# Patient Record
Sex: Male | Born: 1965 | Race: White | Hispanic: No | Marital: Married | Smoking: Current every day smoker
Health system: Southern US, Community
[De-identification: ages and names within clinical notes are randomized; demographics above are authoritative.]

## PROBLEM LIST (undated history)

## (undated) DIAGNOSIS — I2510 Atherosclerotic heart disease of native coronary artery without angina pectoris: Secondary | ICD-10-CM

## (undated) DIAGNOSIS — E785 Hyperlipidemia, unspecified: Secondary | ICD-10-CM

## (undated) DIAGNOSIS — F410 Panic disorder [episodic paroxysmal anxiety] without agoraphobia: Secondary | ICD-10-CM

## (undated) DIAGNOSIS — Z720 Tobacco use: Secondary | ICD-10-CM

## (undated) DIAGNOSIS — K259 Gastric ulcer, unspecified as acute or chronic, without hemorrhage or perforation: Secondary | ICD-10-CM

## (undated) DIAGNOSIS — F319 Bipolar disorder, unspecified: Secondary | ICD-10-CM

## (undated) HISTORY — PX: CHOLECYSTECTOMY: SHX55

## (undated) HISTORY — DX: Tobacco use: Z72.0

## (undated) HISTORY — DX: Atherosclerotic heart disease of native coronary artery without angina pectoris: I25.10

## (undated) HISTORY — PX: SMALL INTESTINE SURGERY: SHX150

## (undated) HISTORY — DX: Hyperlipidemia, unspecified: E78.5

---

## 2006-04-22 ENCOUNTER — Emergency Department: Payer: Self-pay | Admitting: General Practice

## 2006-07-11 ENCOUNTER — Emergency Department: Payer: Self-pay | Admitting: Emergency Medicine

## 2006-08-12 ENCOUNTER — Emergency Department: Payer: Self-pay | Admitting: Emergency Medicine

## 2006-11-25 ENCOUNTER — Emergency Department: Payer: Self-pay | Admitting: General Practice

## 2016-01-17 ENCOUNTER — Encounter: Payer: Self-pay | Admitting: Emergency Medicine

## 2016-01-17 ENCOUNTER — Emergency Department
Admission: EM | Admit: 2016-01-17 | Discharge: 2016-01-17 | Disposition: A | Discharge status: Home / Self Care | Payer: Medicare Other | Attending: Emergency Medicine | Admitting: Emergency Medicine

## 2016-01-17 DIAGNOSIS — H6122 Impacted cerumen, left ear: Secondary | ICD-10-CM | POA: Diagnosis not present

## 2016-01-17 DIAGNOSIS — F17200 Nicotine dependence, unspecified, uncomplicated: Secondary | ICD-10-CM | POA: Diagnosis not present

## 2016-01-17 DIAGNOSIS — H9192 Unspecified hearing loss, left ear: Secondary | ICD-10-CM | POA: Diagnosis present

## 2016-01-17 HISTORY — DX: Panic disorder (episodic paroxysmal anxiety): F41.0

## 2016-01-17 HISTORY — DX: Bipolar disorder, unspecified: F31.9

## 2016-01-17 NOTE — ED Notes (Signed)
Pt states two weeks of left sided ear hearing loss without drainage, fever, pain. Pt appears in no acute distress. Pt concerned for wax buildup.

## 2016-01-17 NOTE — Discharge Instructions (Signed)
Cerumen Impaction The structures of the external ear canal secrete a waxy substance known as cerumen. Excess cerumen can build up in the ear canal, causing a condition known as cerumen impaction. Cerumen impaction can cause ear pain and disrupt the function of the ear. The rate of cerumen production differs for each individual. In certain individuals, the configuration of the ear canal may decrease his or her ability to naturally remove cerumen. CAUSES Cerumen impaction is caused by excessive cerumen production or buildup. RISK FACTORS  Frequent use of swabs to clean ears.  Having narrow ear canals.  Having eczema.  Being dehydrated. SIGNS AND SYMPTOMS  Diminished hearing.  Ear drainage.  Ear pain.  Ear itch. TREATMENT Treatment may involve:  Over-the-counter or prescription ear drops to soften the cerumen.  Removal of cerumen by a health care provider. This may be done with:  Irrigation with warm water. This is the most common method of removal.  Ear curettes and other instruments.  Surgery. This may be done in severe cases. HOME CARE INSTRUCTIONS  Take medicines only as directed by your health care provider.  Do not insert objects into the ear with the intent of cleaning the ear. PREVENTION  Do not insert objects into the ear, even with the intent of cleaning the ear. Removing cerumen as a part of normal hygiene is not necessary, and the use of swabs in the ear canal is not recommended.  Drink enough water to keep your urine clear or pale yellow.  Control your eczema if you have it. SEEK MEDICAL CARE IF:  You develop ear pain.  You develop bleeding from the ear.  The cerumen does not clear after you use ear drops as directed.   This information is not intended to replace advice given to you by your health care provider. Make sure you discuss any questions you have with your health care provider.   Document Released: 10/01/2004 Document Revised: 09/14/2014  Document Reviewed: 04/10/2015 Elsevier Interactive Patient Education 2016 [employer]  

## 2016-01-17 NOTE — ED Provider Notes (Signed)
[facility] Emergency Department Provider Note  ____________________________________________  Time seen: Approximately 8:48 PM  I have reviewed the triage vital signs and the nursing notes.   HISTORY  Chief Complaint Hearing Loss    HPI Thomas Romero is a 50 y.o. male who presents to the emergency room with a complaint of left-sided hearing loss. Patient states that he does have excessive earwax. He reports decreased hearing/hearing loss in left ear 2 weeks. Patient denies any trauma to the ear including barotrauma. Patient has not tried any medications prior to arrival. He denies any pain at this time.   Past Medical History  Diagnosis Date  . Manic depression (HCC)   . Panic attacks     There are no active problems to display for this patient.   Past Surgical History  Procedure Laterality Date  . Small intestine surgery      No current outpatient prescriptions on file.  Allergies Review of patient's allergies indicates no known allergies.  No family history on file.  Social History Social History  Substance Use Topics  . Smoking status: Current Some Day Smoker  . Smokeless tobacco: Never Used  . Alcohol Use: Yes     Review of Systems  Constitutional: No fever/chills Eyes: No visual changes. No discharge ENT: Positive for left sided hearing loss. Cardiovascular: no chest pain. Respiratory: no cough. No SOB. Musculoskeletal: Negative for musculoskeletal pain. Skin: Negative for rash, abrasions, lacerations, ecchymosis. Neurological: Negative for headaches, focal weakness or numbness. 10-point ROS otherwise negative.  ____________________________________________   PHYSICAL EXAM:  VITAL SIGNS: ED Triage Vitals  Enc Vitals Group     BP 01/17/16 1950 126/79 mmHg     Pulse Rate 01/17/16 1950 90     Resp 01/17/16 1950 14     Temp 01/17/16 1950 98 F (36.7 C)     Temp Source 01/17/16 1950 Oral     SpO2 01/17/16 1950 96 %    Weight 01/17/16 1950 147 lb (66.679 kg)     Height 01/17/16 1950 5\' 10"  (1.778 m)     Head Cir --      Peak Flow --      Pain Score --      Pain Loc --      Pain Edu? --      Excl. in GC? --      Constitutional: Alert and oriented. Well appearing and in no acute distress. Eyes: Conjunctivae are normal. PERRL. EOMI. Head: Atraumatic. ENT:      Ears: EACs with excessive amounts of cerumen bilaterally. Right side TM is still slightly visualized behind cerumen. Left side reveals complete impaction of cerumen.      Nose: No congestion/rhinnorhea.      Mouth/Throat: Mucous membranes are moist.  Neck: No stridor.   Cardiovascular: Normal rate, regular rhythm. Normal S1 and S2.  Good peripheral circulation. Respiratory: Normal respiratory effort without tachypnea or retractions. Lungs CTAB. Good air entry to the bases with no decreased or absent breath sounds. Musculoskeletal: Full range of motion to all extremities. No gross deformities appreciated. Neurologic:  Normal speech and language. No gross focal neurologic deficits are appreciated.  Skin:  Skin is warm, dry and intact. No rash noted. Psychiatric: Mood and affect are normal. Speech and behavior are normal. Patient exhibits appropriate insight and judgement.   ____________________________________________   LABS (all labs ordered are listed, but only abnormal results are displayed)  Labs Reviewed - No data to display ____________________________________________  EKG  ____________________________________________  RADIOLOGY   No results found.  ____________________________________________    PROCEDURES  Procedure(s) performed:       Medications - No data to display   ____________________________________________   INITIAL IMPRESSION / ASSESSMENT AND PLAN / ED COURSE  Pertinent labs & imaging results that were available during my care of the patient were reviewed by me and considered in my medical  decision making (see chart for details).  Patient's diagnosis is consistent with cerumen impaction to the left ear. Patient routinely has excessive amounts of cerumen. It is impacted on the left side causing hearing loss. Patient is instructed to use cerumen cleaner and warm water with hydrogen peroxide for symptom relief. He'll follow up with ENT surgeon should this not resolve symptoms..  Patient is given ED precautions to return to the ED for any worsening or new symptoms.     ____________________________________________  FINAL CLINICAL IMPRESSION(S) / ED DIAGNOSES  Final diagnoses:  Cerumen impaction, left      NEW MEDICATIONS STARTED DURING THIS VISIT:  New Prescriptions   No medications on file        This chart was dictated using voice recognition software/Dragon. Despite best efforts to proofread, errors can occur which can change the meaning. Any change was purely unintentional.    Racheal Patches, PA-C 01/17/16 2053  Jene Every, MD 01/17/16 [phone]

## 2017-07-06 ENCOUNTER — Encounter: Payer: Self-pay | Admitting: *Deleted

## 2017-07-06 ENCOUNTER — Emergency Department: Payer: Medicare Other

## 2017-07-06 ENCOUNTER — Emergency Department
Admission: EM | Admit: 2017-07-06 | Discharge: 2017-07-06 | Disposition: A | Discharge status: Home / Self Care | Payer: Medicare Other | Attending: Emergency Medicine | Admitting: Emergency Medicine

## 2017-07-06 DIAGNOSIS — Y998 Other external cause status: Secondary | ICD-10-CM | POA: Insufficient documentation

## 2017-07-06 DIAGNOSIS — W108XXA Fall (on) (from) other stairs and steps, initial encounter: Secondary | ICD-10-CM | POA: Diagnosis not present

## 2017-07-06 DIAGNOSIS — Y9301 Activity, walking, marching and hiking: Secondary | ICD-10-CM | POA: Insufficient documentation

## 2017-07-06 DIAGNOSIS — Y929 Unspecified place or not applicable: Secondary | ICD-10-CM | POA: Insufficient documentation

## 2017-07-06 DIAGNOSIS — F17200 Nicotine dependence, unspecified, uncomplicated: Secondary | ICD-10-CM | POA: Diagnosis not present

## 2017-07-06 DIAGNOSIS — S5001XA Contusion of right elbow, initial encounter: Secondary | ICD-10-CM | POA: Insufficient documentation

## 2017-07-06 DIAGNOSIS — S59901A Unspecified injury of right elbow, initial encounter: Secondary | ICD-10-CM | POA: Diagnosis present

## 2017-07-06 MED ORDER — PREDNISONE 10 MG PO TABS: 10.0000 mg | ORAL_TABLET | Freq: Every day | ORAL | 0 refills | Status: DC

## 2017-07-06 MED ORDER — HYDROCODONE-ACETAMINOPHEN 5-325 MG PO TABS
1.0000 | ORAL_TABLET | ORAL | 0 refills | Status: DC | PRN
Start: 2017-07-06 — End: 2018-08-02

## 2017-07-06 MED ORDER — OXYCODONE-ACETAMINOPHEN 5-325 MG PO TABS
1.0000 | ORAL_TABLET | Freq: Once | ORAL | Status: AC
  Administered 2017-07-06: 1 via ORAL
  Filled 2017-07-06: qty 1

## 2017-07-06 MED ORDER — ONDANSETRON 8 MG PO TBDP
8.0000 mg | ORAL_TABLET | Freq: Once | ORAL | Status: AC
  Administered 2017-07-06: 8 mg via ORAL
  Filled 2017-07-06: qty 1

## 2017-07-06 NOTE — ED Triage Notes (Signed)
States right elbow pain after he slipped down the stairs this afternoon, awake and alert in no acute distress

## 2017-07-06 NOTE — ED Provider Notes (Signed)
[facility] Emergency Department Provider Note  ____________________________________________  Time seen: Approximately 4:49 PM  I have reviewed the triage vital signs and the nursing notes.   HISTORY  Chief Complaint Elbow Injury    HPI Thomas Romero is a 51 y.o. male who presents the emergency department complaining of right elbow pain.  Patient reports that he was walking down a flight of stairs when he slipped, falling, landing directly on his right elbow.  Patient has a very remote history of nondisplaced elbow fracture occurring almost 30 years prior.  Patient reports that he has pain over the posterior aspect of the elbow.  He has had little range of motion since the injury due to pain.  No numbness or tingling in his hand.  No injury to his shoulder.  No other injury or complaint at this time.  He did not hit his head or lose consciousness, complaining of chest pain, shortness of breath, nausea or vomiting.  No medications for this complaint prior to arrival.  Past Medical History:  Diagnosis Date  . Manic depression (HCC)   . Panic attacks     There are no active problems to display for this patient.   Past Surgical History:  Procedure Laterality Date  . SMALL INTESTINE SURGERY      Prior to Admission medications   Medication Sig Start Date End Date Taking? Authorizing Provider  HYDROcodone-acetaminophen (NORCO/VICODIN) 5-325 MG tablet Take 1 tablet by mouth every 4 (four) hours as needed for moderate pain. 07/06/17   Breindy Meadow, Delorise Royals, PA-C  predniSONE (DELTASONE) 10 MG tablet Take 1 tablet (10 mg total) by mouth daily. 07/06/17   Samaj Wessells, Delorise Royals, PA-C    Allergies Patient has no known allergies.  History reviewed. No pertinent family history.  Social History Social History  Substance Use Topics  . Smoking status: Current Some Day Smoker  . Smokeless tobacco: Never Used  . Alcohol use Yes     Review of Systems   Constitutional: No fever/chills Eyes: No visual changes.  Cardiovascular: no chest pain. Respiratory: no cough. No SOB. Musculoskeletal: Positive for right elbow pain Skin: Negative for rash, abrasions, lacerations, ecchymosis. Neurological: Negative for headaches, focal weakness or numbness. 10-point ROS otherwise negative.  ____________________________________________   PHYSICAL EXAM:  VITAL SIGNS: ED Triage Vitals [07/06/17 1601]  Enc Vitals Group     BP (!) 145/87     Pulse Rate (!) 110     Resp 18     Temp 97.8 F (36.6 C)     Temp Source Oral     SpO2 96 %     Weight 146 lb (66.2 kg)     Height 5\' 10"  (1.778 m)     Head Circumference      Peak Flow      Pain Score 8     Pain Loc      Pain Edu?      Excl. in GC?      Constitutional: Alert and oriented. Well appearing and in no acute distress. Eyes: Conjunctivae are normal. PERRL. EOMI. Head: Atraumatic. Neck: No stridor.    Cardiovascular: Normal rate, regular rhythm. Normal S1 and S2.  Good peripheral circulation. Respiratory: Normal respiratory effort without tachypnea or retractions. Lungs CTAB. Good air entry to the bases with no decreased or absent breath sounds. Musculoskeletal: Full range of motion to all extremities. No gross deformities appreciated.  No gross deformity, edema, ecchymosis noted to the right elbow.  Patient is tender  to palpation over the posterior elbow with significant concentration around the olecranon process.  No palpable abnormality.  Limited range of motion due to pain, however patient does have extension and flexion of the elbow.  At rest, patient is keeping his elbow and flexion.  Radial pulse intact distally.  Sensation intact all 5 digits distally. Neurologic:  Normal speech and language. No gross focal neurologic deficits are appreciated.  Skin:  Skin is warm, dry and intact. No rash noted. Psychiatric: Mood and affect are normal. Speech and behavior are normal. Patient exhibits  appropriate insight and judgement.   ____________________________________________   LABS (all labs ordered are listed, but only abnormal results are displayed)  Labs Reviewed - No data to display ____________________________________________  EKG   ____________________________________________  RADIOLOGY Festus BarrenI, Rayne Cowdrey D Benett Swoyer, personally viewed and evaluated these images (plain radiographs) as part of my medical decision making, as well as reviewing the written report by the radiologist.  Dg Elbow Complete Right  Result Date: 07/06/2017 CLINICAL DATA:  Posterior elbow pain after fall this afternoon. EXAM: RIGHT ELBOW - COMPLETE 3+ VIEW COMPARISON:  None. FINDINGS: There is no evidence of fracture, dislocation, or joint effusion. Minimal spurring at the triceps insertion on the olecranon. There is no evidence of arthropathy or other focal bone abnormality. Soft tissues are unremarkable. IMPRESSION: Tiny olecranon spur. No acute fracture nor dislocation. No joint effusion. Electronically Signed   By: Tollie Ethavid  Kwon M.D.   On: 07/06/2017 17:55    ____________________________________________    PROCEDURES  Procedure(s) performed:    Procedures    Medications  oxyCODONE-acetaminophen (PERCOCET/ROXICET) 5-325 MG per tablet 1 tablet (1 tablet Oral Given 07/06/17 1711)  ondansetron (ZOFRAN-ODT) disintegrating tablet 8 mg (8 mg Oral Given 07/06/17 1711)     ____________________________________________   INITIAL IMPRESSION / ASSESSMENT AND PLAN / ED COURSE  Pertinent labs & imaging results that were available during my care of the patient were reviewed by me and considered in my medical decision making (see chart for details).  Review of the [place] CSRS was performed in accordance of the [facility] prior to dispensing any controlled drugs.     Patient's diagnosis is consistent with elbow contusion.  Initial differential included fracture versus contusion versus joint effusion.   Patient's exam is reassuring.  X-ray reveals no patient is given sling for comfort.. Patient will be discharged home with prescriptions for prednisone taper as patient is unable to take NSAIDs due to gastric ulcers, and very limited pain medication.. Patient is to follow up with primary care or orthopedics as needed or otherwise directed. Patient is given ED precautions to return to the ED for any worsening or new symptoms.     ____________________________________________  FINAL CLINICAL IMPRESSION(S) / ED DIAGNOSES  Final diagnoses:  Contusion of right elbow, initial encounter      NEW MEDICATIONS STARTED DURING THIS VISIT:  New Prescriptions   HYDROCODONE-ACETAMINOPHEN (NORCO/VICODIN) 5-325 MG TABLET    Take 1 tablet by mouth every 4 (four) hours as needed for moderate pain.   PREDNISONE (DELTASONE) 10 MG TABLET    Take 1 tablet (10 mg total) by mouth daily.        This chart was dictated using voice recognition software/Dragon. Despite best efforts to proofread, errors can occur which can change the meaning. Any change was purely unintentional.    Racheal PatchesCuthriell, Emett Stapel D, PA-C 07/06/17 1808    Rockne MenghiniNorman, Anne-Caroline, MD 07/06/17 [phone]

## 2018-07-24 ENCOUNTER — Emergency Department: Payer: Medicare HMO

## 2018-07-24 ENCOUNTER — Inpatient Hospital Stay
Admission: EM | Admit: 2018-07-24 | Discharge: 2018-07-25 | DRG: 247 | Discharge status: Against Medical Advice | Payer: Medicare HMO | Attending: Cardiovascular Disease | Admitting: Cardiovascular Disease

## 2018-07-24 DIAGNOSIS — Z7902 Long term (current) use of antithrombotics/antiplatelets: Secondary | ICD-10-CM

## 2018-07-24 DIAGNOSIS — I2111 ST elevation (STEMI) myocardial infarction involving right coronary artery: Secondary | ICD-10-CM

## 2018-07-24 DIAGNOSIS — I214 Non-ST elevation (NSTEMI) myocardial infarction: Secondary | ICD-10-CM | POA: Diagnosis present

## 2018-07-24 DIAGNOSIS — Z5329 Procedure and treatment not carried out because of patient's decision for other reasons: Secondary | ICD-10-CM | POA: Diagnosis not present

## 2018-07-24 DIAGNOSIS — Z951 Presence of aortocoronary bypass graft: Secondary | ICD-10-CM

## 2018-07-24 DIAGNOSIS — I10 Essential (primary) hypertension: Secondary | ICD-10-CM | POA: Diagnosis present

## 2018-07-24 DIAGNOSIS — F17200 Nicotine dependence, unspecified, uncomplicated: Secondary | ICD-10-CM | POA: Diagnosis present

## 2018-07-24 DIAGNOSIS — R7989 Other specified abnormal findings of blood chemistry: Secondary | ICD-10-CM | POA: Diagnosis not present

## 2018-07-24 DIAGNOSIS — F32A Depression, unspecified: Secondary | ICD-10-CM | POA: Diagnosis present

## 2018-07-24 DIAGNOSIS — F329 Major depressive disorder, single episode, unspecified: Secondary | ICD-10-CM | POA: Diagnosis present

## 2018-07-24 DIAGNOSIS — Z79899 Other long term (current) drug therapy: Secondary | ICD-10-CM

## 2018-07-24 DIAGNOSIS — F3130 Bipolar disorder, current episode depressed, mild or moderate severity, unspecified: Secondary | ICD-10-CM | POA: Diagnosis present

## 2018-07-24 DIAGNOSIS — R778 Other specified abnormalities of plasma proteins: Secondary | ICD-10-CM

## 2018-07-24 DIAGNOSIS — E119 Type 2 diabetes mellitus without complications: Secondary | ICD-10-CM | POA: Diagnosis present

## 2018-07-24 DIAGNOSIS — Z8249 Family history of ischemic heart disease and other diseases of the circulatory system: Secondary | ICD-10-CM

## 2018-07-24 DIAGNOSIS — Z8711 Personal history of peptic ulcer disease: Secondary | ICD-10-CM

## 2018-07-24 DIAGNOSIS — F410 Panic disorder [episodic paroxysmal anxiety] without agoraphobia: Secondary | ICD-10-CM | POA: Diagnosis present

## 2018-07-24 DIAGNOSIS — Z886 Allergy status to analgesic agent status: Secondary | ICD-10-CM

## 2018-07-24 DIAGNOSIS — Z7982 Long term (current) use of aspirin: Secondary | ICD-10-CM

## 2018-07-24 DIAGNOSIS — R079 Chest pain, unspecified: Secondary | ICD-10-CM

## 2018-07-24 DIAGNOSIS — I2510 Atherosclerotic heart disease of native coronary artery without angina pectoris: Secondary | ICD-10-CM | POA: Diagnosis present

## 2018-07-24 DIAGNOSIS — E785 Hyperlipidemia, unspecified: Secondary | ICD-10-CM | POA: Diagnosis present

## 2018-07-24 DIAGNOSIS — I213 ST elevation (STEMI) myocardial infarction of unspecified site: Principal | ICD-10-CM | POA: Diagnosis present

## 2018-07-24 DIAGNOSIS — I252 Old myocardial infarction: Secondary | ICD-10-CM

## 2018-07-24 DIAGNOSIS — F419 Anxiety disorder, unspecified: Secondary | ICD-10-CM | POA: Diagnosis present

## 2018-07-24 DIAGNOSIS — E876 Hypokalemia: Secondary | ICD-10-CM | POA: Diagnosis present

## 2018-07-24 DIAGNOSIS — K219 Gastro-esophageal reflux disease without esophagitis: Secondary | ICD-10-CM | POA: Diagnosis present

## 2018-07-24 HISTORY — DX: Gastric ulcer, unspecified as acute or chronic, without hemorrhage or perforation: K25.9

## 2018-07-24 LAB — CBC
HCT: 45 % (ref 39.0–52.0)
Hemoglobin: 16 g/dL (ref 13.0–17.0)
MCH: 32.1 pg (ref 26.0–34.0)
MCHC: 35.6 g/dL (ref 30.0–36.0)
MCV: 90.4 fL (ref 80.0–100.0)
NRBC: 0.2 % (ref 0.0–0.2)
PLATELETS: 192 10*3/uL (ref 150–400)
RBC: 4.98 MIL/uL (ref 4.22–5.81)
RDW: 12.4 % (ref 11.5–15.5)
WBC: 12.1 10*3/uL — AB (ref 4.0–10.5)

## 2018-07-24 LAB — BASIC METABOLIC PANEL
Anion gap: 8 (ref 5–15)
BUN: 14 mg/dL (ref 6–20)
CALCIUM: 9.7 mg/dL (ref 8.9–10.3)
CO2: 28 mmol/L (ref 22–32)
CREATININE: 1.15 mg/dL (ref 0.61–1.24)
Chloride: 102 mmol/L (ref 98–111)
Glucose, Bld: 126 mg/dL — ABNORMAL HIGH (ref 70–99)
Potassium: 3.8 mmol/L (ref 3.5–5.1)
SODIUM: 138 mmol/L (ref 135–145)

## 2018-07-24 LAB — PROTIME-INR
INR: 0.89
Prothrombin Time: 12 seconds (ref 11.4–15.2)

## 2018-07-24 LAB — TROPONIN I: TROPONIN I: 1.77 ng/mL — AB (ref <0.03)

## 2018-07-24 LAB — APTT: aPTT: 29 seconds (ref 24–36)

## 2018-07-24 MED ORDER — NITROGLYCERIN 0.4 MG SL SUBL
SUBLINGUAL_TABLET | SUBLINGUAL | Status: AC
  Filled 2018-07-24: qty 1

## 2018-07-24 MED ORDER — HEPARIN (PORCINE) 25000 UT/250ML-% IV SOLN
800.0000 [IU]/h | INTRAVENOUS | Status: DC
  Administered 2018-07-24: 800 [IU]/h via INTRAVENOUS
  Filled 2018-07-24: qty 250

## 2018-07-24 MED ORDER — NITROGLYCERIN IN D5W 200-5 MCG/ML-% IV SOLN
0.0000 ug/min | INTRAVENOUS | Status: DC
  Administered 2018-07-24: 5 ug/min via INTRAVENOUS
  Filled 2018-07-24: qty 250

## 2018-07-24 MED ORDER — NITROGLYCERIN 0.4 MG SL SUBL
0.4000 mg | SUBLINGUAL_TABLET | SUBLINGUAL | Status: DC | PRN
  Administered 2018-07-24 (×2): 0.4 mg via SUBLINGUAL

## 2018-07-24 MED ORDER — LORAZEPAM 2 MG/ML IJ SOLN
0.5000 mg | Freq: Once | INTRAMUSCULAR | Status: AC
Start: 2018-07-24 — End: 2018-07-24
  Administered 2018-07-24: 0.5 mg via INTRAVENOUS
  Filled 2018-07-24: qty 1

## 2018-07-24 MED ORDER — ASPIRIN 81 MG PO CHEW
CHEWABLE_TABLET | ORAL | Status: AC
  Administered 2018-07-25: 04:00:00
  Filled 2018-07-24: qty 4

## 2018-07-24 MED ORDER — HEPARIN BOLUS VIA INFUSION
4000.0000 [IU] | Freq: Once | INTRAVENOUS | Status: AC
  Administered 2018-07-24: 4000 [IU] via INTRAVENOUS
  Filled 2018-07-24: qty 4000

## 2018-07-24 MED ORDER — SODIUM CHLORIDE 0.9 % IV SOLN
Freq: Once | INTRAVENOUS | Status: AC
  Administered 2018-07-24: via INTRAVENOUS

## 2018-07-24 MED ORDER — NICOTINE 21 MG/24HR TD PT24
21.0000 mg | MEDICATED_PATCH | Freq: Once | TRANSDERMAL | Status: DC
  Administered 2018-07-24: 21 mg via TRANSDERMAL
  Filled 2018-07-24: qty 1

## 2018-07-24 MED ORDER — ASPIRIN 81 MG PO CHEW
324.0000 mg | CHEWABLE_TABLET | Freq: Once | ORAL | Status: AC
  Administered 2018-07-24: 324 mg via ORAL

## 2018-07-24 NOTE — ED Notes (Signed)
Pt states he wants to leave and go smoke. Pt states he will stay under " one stipulation is that I go outside and smoke first" MD Derrill KayGoodman at bedside and explaining protocol and unsafety of pt being able to go outside. PT upset at this time. [facility]

## 2018-07-24 NOTE — ED Triage Notes (Addendum)
Patient c/o left chest pain, radiating to back X 2 hours. Patient describes pain as "being hit in the chest with a sledgehammer". Patient clutching chest in triage.  Patient reports accompanying symptoms of SOB, fatigue.   Patient denies diaphoresis, per significant other, the patient was pale, near syncopal, and diaphoretic.

## 2018-07-24 NOTE — Consult Note (Signed)
ANTICOAGULATION CONSULT NOTE - Follow Up Consult  Pharmacy Consult for Heparin Infusion  Indication: chest pain/ACS  Allergies  Allergen Reactions  . Ibuprofen Other (See Comments)    Gastric ulcers    Patient Measurements: Height: 5\' 10"  (177.8 cm) Weight: 145 lb 15.1 oz (66.2 kg) IBW/kg (Calculated) : 73  Vital Signs: Temp: 97.5 F (36.4 C) (11/17 2227) Temp Source: Axillary (11/17 2227) BP: 127/78 (11/17 2253) Pulse Rate: 82 (11/17 2300)  Labs: Recent Labs    07/24/18 2236  HGB 16.0  HCT 45.0  PLT 192  CREATININE 1.15  TROPONINI 1.77*    Estimated Creatinine Clearance: 71.2 mL/min (by C-G formula based on SCr of 1.15 mg/dL).   Assessment: Pharmacy consulted for heparin drip dosing and monitoring in 52 yo male for ACS/STEMI  Goal of Therapy:  Heparin level 0.3-0.7 units/ml Monitor platelets by anticoagulation protocol: Yes   Plan:  Baseline labs ordered Give 4000 units bolus x 1 Start heparin infusion at 800 units/hr Check anti-Xa level in 6 hours and daily while on heparin Continue to monitor H&H and platelets  Gardner CandleSheema M Maylen Waltermire, PharmD, BCPS Clinical Pharmacist 07/24/2018 11:09 PM

## 2018-07-24 NOTE — ED Notes (Signed)
Pt placed on cardiac pads.

## 2018-07-25 ENCOUNTER — Other Ambulatory Visit: Payer: Self-pay

## 2018-07-25 ENCOUNTER — Encounter: Payer: Self-pay | Admitting: Cardiovascular Disease

## 2018-07-25 ENCOUNTER — Encounter
Admission: EM | Discharge status: Against Medical Advice | Payer: Self-pay | Source: Home / Self Care | Attending: Cardiovascular Disease

## 2018-07-25 DIAGNOSIS — Z8249 Family history of ischemic heart disease and other diseases of the circulatory system: Secondary | ICD-10-CM | POA: Diagnosis not present

## 2018-07-25 DIAGNOSIS — E119 Type 2 diabetes mellitus without complications: Secondary | ICD-10-CM | POA: Diagnosis present

## 2018-07-25 DIAGNOSIS — F329 Major depressive disorder, single episode, unspecified: Secondary | ICD-10-CM | POA: Diagnosis present

## 2018-07-25 DIAGNOSIS — Z951 Presence of aortocoronary bypass graft: Secondary | ICD-10-CM | POA: Diagnosis not present

## 2018-07-25 DIAGNOSIS — I10 Essential (primary) hypertension: Secondary | ICD-10-CM | POA: Diagnosis present

## 2018-07-25 DIAGNOSIS — Z886 Allergy status to analgesic agent status: Secondary | ICD-10-CM | POA: Diagnosis not present

## 2018-07-25 DIAGNOSIS — F17200 Nicotine dependence, unspecified, uncomplicated: Secondary | ICD-10-CM | POA: Diagnosis present

## 2018-07-25 DIAGNOSIS — I2510 Atherosclerotic heart disease of native coronary artery without angina pectoris: Secondary | ICD-10-CM

## 2018-07-25 DIAGNOSIS — I252 Old myocardial infarction: Secondary | ICD-10-CM | POA: Diagnosis not present

## 2018-07-25 DIAGNOSIS — R7989 Other specified abnormal findings of blood chemistry: Secondary | ICD-10-CM | POA: Diagnosis present

## 2018-07-25 DIAGNOSIS — I214 Non-ST elevation (NSTEMI) myocardial infarction: Secondary | ICD-10-CM | POA: Diagnosis not present

## 2018-07-25 DIAGNOSIS — E785 Hyperlipidemia, unspecified: Secondary | ICD-10-CM | POA: Diagnosis present

## 2018-07-25 DIAGNOSIS — Z79899 Other long term (current) drug therapy: Secondary | ICD-10-CM | POA: Diagnosis not present

## 2018-07-25 DIAGNOSIS — I213 ST elevation (STEMI) myocardial infarction of unspecified site: Secondary | ICD-10-CM | POA: Diagnosis present

## 2018-07-25 DIAGNOSIS — Z8711 Personal history of peptic ulcer disease: Secondary | ICD-10-CM | POA: Diagnosis not present

## 2018-07-25 DIAGNOSIS — Z7902 Long term (current) use of antithrombotics/antiplatelets: Secondary | ICD-10-CM | POA: Diagnosis not present

## 2018-07-25 DIAGNOSIS — F410 Panic disorder [episodic paroxysmal anxiety] without agoraphobia: Secondary | ICD-10-CM | POA: Diagnosis present

## 2018-07-25 DIAGNOSIS — K219 Gastro-esophageal reflux disease without esophagitis: Secondary | ICD-10-CM | POA: Diagnosis present

## 2018-07-25 DIAGNOSIS — F32A Depression, unspecified: Secondary | ICD-10-CM | POA: Diagnosis present

## 2018-07-25 DIAGNOSIS — E876 Hypokalemia: Secondary | ICD-10-CM | POA: Diagnosis present

## 2018-07-25 DIAGNOSIS — Z7982 Long term (current) use of aspirin: Secondary | ICD-10-CM | POA: Diagnosis not present

## 2018-07-25 DIAGNOSIS — F419 Anxiety disorder, unspecified: Secondary | ICD-10-CM | POA: Diagnosis present

## 2018-07-25 DIAGNOSIS — F3130 Bipolar disorder, current episode depressed, mild or moderate severity, unspecified: Secondary | ICD-10-CM | POA: Diagnosis present

## 2018-07-25 DIAGNOSIS — Z5329 Procedure and treatment not carried out because of patient's decision for other reasons: Secondary | ICD-10-CM | POA: Diagnosis not present

## 2018-07-25 HISTORY — PX: CORONARY/GRAFT ACUTE MI REVASCULARIZATION: CATH118305

## 2018-07-25 HISTORY — PX: LEFT HEART CATH AND CORONARY ANGIOGRAPHY: CATH118249

## 2018-07-25 LAB — CBC
HEMATOCRIT: 40.5 % (ref 39.0–52.0)
HEMOGLOBIN: 13.9 g/dL (ref 13.0–17.0)
MCH: 31.7 pg (ref 26.0–34.0)
MCHC: 34.3 g/dL (ref 30.0–36.0)
MCV: 92.3 fL (ref 80.0–100.0)
Platelets: 171 10*3/uL (ref 150–400)
RBC: 4.39 MIL/uL (ref 4.22–5.81)
RDW: 12.4 % (ref 11.5–15.5)
WBC: 10.9 10*3/uL — AB (ref 4.0–10.5)
nRBC: 0 % (ref 0.0–0.2)

## 2018-07-25 LAB — BASIC METABOLIC PANEL
Anion gap: 6 (ref 5–15)
BUN: 15 mg/dL (ref 6–20)
CHLORIDE: 106 mmol/L (ref 98–111)
CO2: 26 mmol/L (ref 22–32)
Calcium: 8.8 mg/dL — ABNORMAL LOW (ref 8.9–10.3)
Creatinine, Ser: 0.94 mg/dL (ref 0.61–1.24)
GFR calc Af Amer: >60 mL/min (ref >60)
GFR calc non Af Amer: >60 mL/min (ref >60)
GLUCOSE: 139 mg/dL — AB (ref 70–99)
Potassium: 3.3 mmol/L — ABNORMAL LOW (ref 3.5–5.1)
SODIUM: 138 mmol/L (ref 135–145)

## 2018-07-25 LAB — URINE DRUG SCREEN, QUALITATIVE (ARMC ONLY)
Amphetamines, Ur Screen: NOT DETECTED
BARBITURATES, UR SCREEN: NOT DETECTED
BENZODIAZEPINE, UR SCRN: NOT DETECTED
CANNABINOID 50 NG, UR ~~LOC~~: NOT DETECTED
Cocaine Metabolite,Ur ~~LOC~~: NOT DETECTED
MDMA (Ecstasy)Ur Screen: NOT DETECTED
Methadone Scn, Ur: NOT DETECTED
Opiate, Ur Screen: NOT DETECTED
PHENCYCLIDINE (PCP) UR S: NOT DETECTED
TRICYCLIC, UR SCREEN: NOT DETECTED

## 2018-07-25 LAB — HEPATIC FUNCTION PANEL
ALK PHOS: 74 U/L (ref 38–126)
ALT: 22 U/L (ref 0–44)
AST: 72 U/L — ABNORMAL HIGH (ref 15–41)
Albumin: 3.8 g/dL (ref 3.5–5.0)
BILIRUBIN DIRECT: 0.1 mg/dL (ref 0.0–0.2)
BILIRUBIN INDIRECT: 0.7 mg/dL (ref 0.3–0.9)
BILIRUBIN TOTAL: 0.8 mg/dL (ref 0.3–1.2)
TOTAL PROTEIN: 6.4 g/dL — AB (ref 6.5–8.1)

## 2018-07-25 LAB — TROPONIN I: Troponin I: 18.51 ng/mL (ref <0.03)

## 2018-07-25 LAB — PHOSPHORUS: Phosphorus: 2.5 mg/dL (ref 2.5–4.6)

## 2018-07-25 LAB — LIPID PANEL
Cholesterol: 203 mg/dL — ABNORMAL HIGH (ref 0–200)
HDL: 24 mg/dL — AB (ref >40)
LDL CALC: 152 mg/dL — AB (ref 0–99)
TRIGLYCERIDES: 134 mg/dL (ref <150)
Total CHOL/HDL Ratio: 8.5 RATIO
VLDL: 27 mg/dL (ref 0–40)

## 2018-07-25 LAB — GLUCOSE, CAPILLARY: Glucose-Capillary: 106 mg/dL — ABNORMAL HIGH (ref 70–99)

## 2018-07-25 LAB — MRSA PCR SCREENING: MRSA BY PCR: NEGATIVE

## 2018-07-25 LAB — MAGNESIUM: Magnesium: 2.1 mg/dL (ref 1.7–2.4)

## 2018-07-25 LAB — POCT ACTIVATED CLOTTING TIME: ACTIVATED CLOTTING TIME: 230 s

## 2018-07-25 SURGERY — CORONARY/GRAFT ACUTE MI REVASCULARIZATION
Anesthesia: Moderate Sedation

## 2018-07-25 MED ORDER — HEPARIN SODIUM (PORCINE) 1000 UNIT/ML IJ SOLN
INTRAMUSCULAR | Status: AC
  Filled 2018-07-25: qty 1

## 2018-07-25 MED ORDER — SODIUM CHLORIDE 0.9 % IV SOLN
INTRAVENOUS | Status: AC | PRN
  Administered 2018-07-25: 250 mL via INTRAVENOUS

## 2018-07-25 MED ORDER — HEPARIN SODIUM (PORCINE) 1000 UNIT/ML IJ SOLN
INTRAMUSCULAR | Status: DC | PRN
  Administered 2018-07-25: 2000 [IU] via INTRAVENOUS
  Administered 2018-07-25: 3000 [IU] via INTRAVENOUS

## 2018-07-25 MED ORDER — SODIUM CHLORIDE 0.9 % WEIGHT BASED INFUSION: 1.0000 mL/kg/h | INTRAVENOUS | Status: DC

## 2018-07-25 MED ORDER — SODIUM CHLORIDE 0.9% FLUSH: 3.0000 mL | Freq: Two times a day (BID) | INTRAVENOUS | Status: DC

## 2018-07-25 MED ORDER — ACETAMINOPHEN 325 MG PO TABS: 650.0000 mg | ORAL_TABLET | ORAL | Status: DC | PRN

## 2018-07-25 MED ORDER — VERAPAMIL HCL 2.5 MG/ML IV SOLN
INTRAVENOUS | Status: DC | PRN
  Administered 2018-07-25: 2.5 mg via INTRA_ARTERIAL

## 2018-07-25 MED ORDER — MIDAZOLAM HCL 2 MG/2ML IJ SOLN
INTRAMUSCULAR | Status: AC
  Filled 2018-07-25: qty 2

## 2018-07-25 MED ORDER — FENTANYL CITRATE (PF) 100 MCG/2ML IJ SOLN
INTRAMUSCULAR | Status: AC
  Filled 2018-07-25: qty 2

## 2018-07-25 MED ORDER — HEPARIN (PORCINE) IN NACL 1000-0.9 UT/500ML-% IV SOLN
INTRAVENOUS | Status: AC
  Filled 2018-07-25: qty 1000

## 2018-07-25 MED ORDER — PANTOPRAZOLE SODIUM 40 MG PO TBEC: 40.0000 mg | DELAYED_RELEASE_TABLET | Freq: Every day | ORAL | 6 refills | Status: DC

## 2018-07-25 MED ORDER — PANTOPRAZOLE SODIUM 40 MG PO TBEC
40.0000 mg | DELAYED_RELEASE_TABLET | Freq: Every day | ORAL | Status: DC
  Administered 2018-07-25: 40 mg via ORAL
  Filled 2018-07-25: qty 1

## 2018-07-25 MED ORDER — ATORVASTATIN CALCIUM 20 MG PO TABS: 80.0000 mg | ORAL_TABLET | Freq: Every day | ORAL | Status: DC

## 2018-07-25 MED ORDER — IOPAMIDOL (ISOVUE-300) INJECTION 61%
INTRAVENOUS | Status: DC | PRN
  Administered 2018-07-25: 125 mL via INTRA_ARTERIAL

## 2018-07-25 MED ORDER — ATORVASTATIN CALCIUM 80 MG PO TABS: 80.0000 mg | ORAL_TABLET | Freq: Every day | ORAL | 6 refills | Status: DC

## 2018-07-25 MED ORDER — ALPRAZOLAM 0.5 MG PO TABS
0.5000 mg | ORAL_TABLET | Freq: Three times a day (TID) | ORAL | Status: DC | PRN
  Administered 2018-07-25: 0.5 mg via ORAL
  Filled 2018-07-25: qty 1

## 2018-07-25 MED ORDER — SODIUM CHLORIDE 0.9 % IV SOLN: 250.0000 mL | INTRAVENOUS | Status: DC | PRN

## 2018-07-25 MED ORDER — NITROGLYCERIN 5 MG/ML IV SOLN
INTRAVENOUS | Status: AC
  Filled 2018-07-25: qty 10

## 2018-07-25 MED ORDER — TICAGRELOR 90 MG PO TABS
ORAL_TABLET | ORAL | Status: DC | PRN
  Administered 2018-07-25: 180 mg via ORAL

## 2018-07-25 MED ORDER — TICAGRELOR 90 MG PO TABS: 90.0000 mg | ORAL_TABLET | Freq: Two times a day (BID) | ORAL | 6 refills | Status: DC

## 2018-07-25 MED ORDER — ENOXAPARIN SODIUM 40 MG/0.4ML ~~LOC~~ SOLN: 40.0000 mg | SUBCUTANEOUS | Status: DC

## 2018-07-25 MED ORDER — VERAPAMIL HCL 2.5 MG/ML IV SOLN
INTRAVENOUS | Status: AC
  Filled 2018-07-25: qty 2

## 2018-07-25 MED ORDER — NITROGLYCERIN 1 MG/10 ML FOR IR/CATH LAB
INTRA_ARTERIAL | Status: DC | PRN
  Administered 2018-07-25: 200 ug via INTRACORONARY
  Administered 2018-07-25: 100 ug via INTRACORONARY

## 2018-07-25 MED ORDER — ONDANSETRON HCL 4 MG/2ML IJ SOLN: 4.0000 mg | Freq: Four times a day (QID) | INTRAMUSCULAR | Status: DC | PRN

## 2018-07-25 MED ORDER — POTASSIUM CHLORIDE 20 MEQ PO PACK
60.0000 meq | PACK | Freq: Once | ORAL | Status: AC
  Administered 2018-07-25: 60 meq via ORAL
  Filled 2018-07-25: qty 3

## 2018-07-25 MED ORDER — NICOTINE 21 MG/24HR TD PT24
21.0000 mg | MEDICATED_PATCH | Freq: Every day | TRANSDERMAL | Status: DC
  Filled 2018-07-25: qty 1

## 2018-07-25 MED ORDER — ASPIRIN 81 MG PO CHEW: 81.0000 mg | CHEWABLE_TABLET | Freq: Every day | ORAL | 0 refills | Status: AC

## 2018-07-25 MED ORDER — TICAGRELOR 90 MG PO TABS
ORAL_TABLET | ORAL | Status: AC
  Filled 2018-07-25: qty 2

## 2018-07-25 MED ORDER — SODIUM CHLORIDE 0.9% FLUSH: 3.0000 mL | INTRAVENOUS | Status: DC | PRN

## 2018-07-25 MED ORDER — ASPIRIN 81 MG PO CHEW
81.0000 mg | CHEWABLE_TABLET | Freq: Every day | ORAL | Status: DC
  Administered 2018-07-25: 81 mg via ORAL
  Filled 2018-07-25: qty 1

## 2018-07-25 MED ORDER — TICAGRELOR 90 MG PO TABS
90.0000 mg | ORAL_TABLET | Freq: Two times a day (BID) | ORAL | Status: DC
  Administered 2018-07-25: 90 mg via ORAL
  Filled 2018-07-25 (×2): qty 1

## 2018-07-25 MED ORDER — METOPROLOL TARTRATE 25 MG PO TABS
12.5000 mg | ORAL_TABLET | Freq: Two times a day (BID) | ORAL | Status: DC
  Administered 2018-07-25: 12.5 mg via ORAL
  Filled 2018-07-25 (×2): qty 1

## 2018-07-25 SURGICAL SUPPLY — 13 items
BALLN TREK RX 2.5X12 (BALLOONS) ×3
BALLOON TREK RX 2.5X12 (BALLOONS) ×1 IMPLANT
CATH INFINITI 5FR JK (CATHETERS) ×3 IMPLANT
CATH VISTA GUIDE 6FR JR4 (CATHETERS) ×3 IMPLANT
CATH VISTA GUIDE 6FR JR4 SH (CATHETERS) ×3 IMPLANT
DEVICE INFLAT 30 PLUS (MISCELLANEOUS) ×3 IMPLANT
DEVICE RAD TR BAND REGULAR (VASCULAR PRODUCTS) ×3 IMPLANT
GLIDESHEATH SLEND SS 6F .021 (SHEATH) ×3 IMPLANT
KIT MANI 3VAL PERCEP (MISCELLANEOUS) ×3 IMPLANT
PACK CARDIAC CATH (CUSTOM PROCEDURE TRAY) ×3 IMPLANT
STENT RESOLUTE ONYX 2.75X18 (Permanent Stent) ×3 IMPLANT
WIRE ROSEN-J .035X260CM (WIRE) ×3 IMPLANT
WIRE RUNTHROUGH .014X180CM (WIRE) ×3 IMPLANT

## 2018-07-25 NOTE — ED Notes (Signed)
Cardiologist at bedside.  

## 2018-07-25 NOTE — Progress Notes (Signed)
   07/25/18 0106  Clinical Encounter Type  Visited With Patient;Health care provider  Visit Type Code  Spiritual Encounters  Spiritual Needs Prayer   Code STEMI: no family present, nurse reported they just left.  Patient would not respond to chaplain or nurse voices, chaplain offered silent and energetic prayers for patient and care team.  Chaplain checked in with unit staff, encouraging them to page chaplain back if patient needs changed or when family returned.

## 2018-07-25 NOTE — ED Provider Notes (Signed)
[facility] Emergency Department Provider Note  ____________________________________________   I have reviewed the triage vital signs and the nursing notes.   HISTORY  Chief Complaint Chest Pain   History limited by: Not Limited   HPI Thomas DakinWarren L Buster is a 52 y.o. male who presents to the emergency department today because of concern for chest pain. Located in the center and left chest. Described as pressure like. Started this evening while he was sitting down. Has been persistent. The patient feels like he did have some shortness of breath. Unsure if he had any sweating. Denies similar symptoms in the past. Denies any unusual activity today. States that he has significant family history of heart attacks.  Per medical record review patient has a history of gastric ulcer, panic attacks.   Past Medical History:  Diagnosis Date  . Gastric ulcer   . Manic depression (HCC)   . Panic attacks     There are no active problems to display for this patient.   Past Surgical History:  Procedure Laterality Date  . SMALL INTESTINE SURGERY      Prior to Admission medications   Medication Sig Start Date End Date Taking? Authorizing Provider  HYDROcodone-acetaminophen (NORCO/VICODIN) 5-325 MG tablet Take 1 tablet by mouth every 4 (four) hours as needed for moderate pain. Patient not taking: Reported on 07/25/2018 07/06/17   Cuthriell, Delorise RoyalsJonathan D, PA-C  predniSONE (DELTASONE) 10 MG tablet Take 1 tablet (10 mg total) by mouth daily. Patient not taking: Reported on 07/25/2018 07/06/17   Cuthriell, Delorise RoyalsJonathan D, PA-C    Allergies Ibuprofen  No family history on file.  Social History Social History   Tobacco Use  . Smoking status: Current Some Day Smoker  . Smokeless tobacco: Never Used  Substance Use Topics  . Alcohol use: Yes  . Drug use: No    Review of Systems Constitutional: No fever/chills Eyes: No visual changes. ENT: No sore throat. Cardiovascular:  Positive for chest pain Respiratory: Positive for shortness of breath. Gastrointestinal: No abdominal pain.  No nausea, no vomiting.  No diarrhea.   Genitourinary: Negative for dysuria. Musculoskeletal: Negative for back pain. Skin: Negative for rash. Neurological: Negative for headaches, focal weakness or numbness.  ____________________________________________   PHYSICAL EXAM:  VITAL SIGNS: ED Triage Vitals [07/24/18 2227]  Enc Vitals Group     BP 115/69     Pulse Rate 60     Resp 17     Temp (!) 97.5 F (36.4 C)     Temp Source Axillary     SpO2 100 %     Weight 145 lb 15.1 oz (66.2 kg)     Height 5\' 10"  (1.778 m)     Head Circumference      Peak Flow      Pain Score 7   Constitutional: Alert and oriented.  Eyes: Conjunctivae are normal.  ENT      Head: Normocephalic and atraumatic.      Nose: No congestion/rhinnorhea.      Mouth/Throat: Mucous membranes are moist.      Neck: No stridor. Hematological/Lymphatic/Immunilogical: No cervical lymphadenopathy. Cardiovascular: Normal rate, regular rhythm.  No murmurs, rubs, or gallops.  Respiratory: Normal respiratory effort without tachypnea nor retractions. Breath sounds are clear and equal bilaterally. No wheezes/rales/rhonchi. Gastrointestinal: Soft and non tender. No rebound. No guarding.  Genitourinary: Deferred Musculoskeletal: Normal range of motion in all extremities. No lower extremity edema. Neurologic:  Normal speech and language. No gross focal neurologic deficits are appreciated.  Skin:  Skin is warm, dry and intact. No rash noted. Psychiatric: Mood and affect are normal. Speech and behavior are normal. Patient exhibits appropriate insight and judgment.  ____________________________________________    LABS (pertinent positives/negatives)  Trop 1.77 CBC wbc 12.1, hgb 16.0, plt 192 BMP wnl except glu 126  ____________________________________________   EKG  I, Phineas Semen, attending physician,  personally viewed and interpreted this EKG  EKG Time: 2224 Rate: 60 Rhythm: sinus rhythm Axis: normal Intervals: qtc 440 QRS: narrow ST changes: st elevation aVR, depression II, III, aVF, V5, V6 Impression: abnormal ekg   ____________________________________________    RADIOLOGY  CXR No acute disease  ____________________________________________   PROCEDURES  Procedures  CRITICAL CARE Performed by: Phineas Semen   Total critical care time: 35 minutes  Critical care time was exclusive of separately billable procedures and treating other patients.  Critical care was necessary to treat or prevent imminent or life-threatening deterioration.  Critical care was time spent personally by me on the following activities: development of treatment plan with patient and/or surrogate as well as nursing, discussions with consultants, evaluation of patient's response to treatment, examination of patient, obtaining history from patient or surrogate, ordering and performing treatments and interventions, ordering and review of laboratory studies, ordering and review of radiographic studies, pulse oximetry and re-evaluation of patient's condition.  ____________________________________________   INITIAL IMPRESSION / ASSESSMENT AND PLAN / ED COURSE  Pertinent labs & imaging results that were available during my care of the patient were reviewed by me and considered in my medical decision making (see chart for details).   Patient presented to the emergency department today because of concerns for chest pain.  Patient does have a concerning clinical story.  Does have significant family history.  Initial EKG was concerning for ischemia given ST elevation in aVR and ST depressions.  Did discuss with Dr.  Sink who reviewed the EKGs.  At this point does not feel patient meets STEMI criteria.  Recommended treated with nitro, aspirin and heparin drip.  I discussed this with the patient.  Patient was  hesitant to be admitted to the hospital.  I discussed with the patient my concern that he is having a heart attack.  Did discuss risk of death with heart attacks if he does not stay for treatment.  Did state that he wanted to smoke a cigarette.  Did offer to give patient nicotine patch and treat anxiety with Ativan. After some discussion patient was willing to be admitted.   ____________________________________________   FINAL CLINICAL IMPRESSION(S) / ED DIAGNOSES  Final diagnoses:  Chest pain, unspecified type  Elevated troponin     Note: This dictation was prepared with Dragon dictation. Any transcriptional errors that result from this process are unintentional     Phineas Semen, MD 07/25/18 1736

## 2018-07-25 NOTE — ED Provider Notes (Signed)
I was called by nursing that the patient had worsening chest pain and his EKG changed.  Repeat EKG performed at 1255 shows anterior bifascicular block along with ST elevation in aVR and very concerning depression in V3 and V4.  I subsequently got a posterior EKG done at 1 AM which is concerning for elevation and posterior V5 and posterior V6 and have activated the Cath Lab at 1:01 AM.  ED ECG REPORT I, Merrily BrittleNeil Karthik Whittinghill, the attending physician, personally viewed and interpreted this ECG.  Date: 07/25/2018 EKG Time: 0055 Rate: 84 Rhythm: Accelerated junctional rhythm QRS Axis: Leftward axis Intervals: Prolonged QTC ST/T Wave abnormalities: ST elevation in aVR with flattened depression in V3 and V4 and upsloping depression in V5 and V6 Narrative Interpretation: Concerning for acute ischemia  ED ECG REPORT I, Merrily BrittleNeil Dequavion Follette, the attending physician, personally viewed and interpreted this ECG.  This is a posterior EKG  Date: 07/25/2018 EKG Time: 0100 Rate: 71 Rhythm: normal sinus rhythm QRS Axis: normal Intervals: normal ST/T Wave abnormalities: ST elevation in posterior V5 posterior V6 concerning for STEMI Narrative Interpretation: Concerning for posterior STEMI  CRITICAL CARE Performed by: Merrily BrittleNeil Adiba Fargnoli   Total critical care time: 30 minutes  Critical care time was exclusive of separately billable procedures and treating other patients.  Critical care was necessary to treat or prevent imminent or life-threatening deterioration.  Critical care was time spent personally by me on the following activities: development of treatment plan with patient and/or surrogate as well as nursing, discussions with consultants, evaluation of patient's response to treatment, examination of patient, obtaining history from patient or surrogate, ordering and performing treatments and interventions, ordering and review of laboratory studies, ordering and review of radiographic studies, pulse oximetry and  re-evaluation of patient's condition.     Merrily Brittleifenbark, Kimani Bedoya, MD 07/25/18 [id number]

## 2018-07-25 NOTE — Progress Notes (Signed)
The patient denies any chest pain but he is very restless and anxious.  He wants to smoke.  He also does not want any monitors on him.  I reordered nicotine patch to be placed now. The patient is threatening to leave the hospital.  I am going to transfer him to 2 A and I strongly recommended that he says hospitalized for at least another day. We will have to make sure that he is able to get all his medications before he leaves.  Otherwise, he is at high risk for stent thrombosis.

## 2018-07-25 NOTE — Progress Notes (Signed)
eLink Physician-Brief Progress Note Patient Name: Evangeline DakinWarren L Polinsky DOB: 1965-10-29 MRN: [id number]   Date of Service  07/25/2018  HPI/Events of Note  52 y.o. male with a hx of tobacco use and stomach ulcers who is being seen today for the evaluation of chest pain and abnormal EKG. Taken to cardiac cath lab. H&P not complete. No cath lab note yet. VSS.   eICU Interventions  No new orders.      Intervention Category Evaluation Type: New Patient Evaluation  Lenell AntuSommer,Brewster Wolters Eugene 07/25/2018, 3:10 AM

## 2018-07-25 NOTE — H&P (Signed)
[facility] - [place] at [facility]   PATIENT NAME: Thomas Romero    MR#:  [id number]  DATE OF BIRTH:  November 12, 1965  DATE OF ADMISSION:  07/24/2018  PRIMARY CARE PHYSICIAN: Patient, No Pcp Per   REQUESTING/REFERRING PHYSICIAN: Kirke CorinArida, MD  CHIEF COMPLAINT:   Chief Complaint  Patient presents with  . Chest Pain    HISTORY OF PRESENT ILLNESS:  Thomas Romero  is a 52 y.o. male who presents with chief complaint as above.  Patient presented to ED with complaint of chest pain.  Here he was found on EKG initially to have ischemic changes, but they did not represent STEMI.  However, here in the ED his pain recurred and increased, and he had new changes on his EKG concerning for progressive ischemia.  He was taken to Cath Lab by cardiology and found to have significant mid RCA blockage, with subsequent successful stent placement.  He was transferred to ICU and hospitalist called.  PAST MEDICAL HISTORY:   Past Medical History:  Diagnosis Date  . Gastric ulcer   . Manic depression (HCC)   . Panic attacks      PAST SURGICAL HISTORY:   Past Surgical History:  Procedure Laterality Date  . SMALL INTESTINE SURGERY       SOCIAL HISTORY:   Social History   Tobacco Use  . Smoking status: Current Some Day Smoker  . Smokeless tobacco: Never Used  Substance Use Topics  . Alcohol use: Yes     FAMILY HISTORY:  Family history reviewed and is noncontributory   DRUG ALLERGIES:   Allergies  Allergen Reactions  . Ibuprofen Other (See Comments)    Gastric ulcers    MEDICATIONS AT HOME:   Prior to Admission medications   Medication Sig Start Date End Date Taking? Authorizing Provider  HYDROcodone-acetaminophen (NORCO/VICODIN) 5-325 MG tablet Take 1 tablet by mouth every 4 (four) hours as needed for moderate pain. Patient not taking: Reported on 07/25/2018 07/06/17   Cuthriell, Delorise RoyalsJonathan D, PA-C  predniSONE (DELTASONE) 10 MG tablet Take 1 tablet (10 mg total)  by mouth daily. Patient not taking: Reported on 07/25/2018 07/06/17   Cuthriell, Delorise RoyalsJonathan D, PA-C    REVIEW OF SYSTEMS:  Review of Systems  Constitutional: Negative for chills, fever, malaise/fatigue and weight loss.  HENT: Negative for ear pain, hearing loss and tinnitus.   Eyes: Negative for blurred vision, double vision, pain and redness.  Respiratory: Negative for cough, hemoptysis and shortness of breath.   Cardiovascular: Positive for chest pain. Negative for palpitations, orthopnea and leg swelling.  Gastrointestinal: Negative for abdominal pain, constipation, diarrhea, nausea and vomiting.  Genitourinary: Negative for dysuria, frequency and hematuria.  Musculoskeletal: Negative for back pain, joint pain and neck pain.  Skin:       No acne, rash, or lesions  Neurological: Negative for dizziness, tremors, focal weakness and weakness.  Endo/Heme/Allergies: Negative for polydipsia. Does not bruise/bleed easily.  Psychiatric/Behavioral: Negative for depression. The patient is not nervous/anxious and does not have insomnia.      VITAL SIGNS:   Vitals:   07/24/18 2340 07/25/18 0000 07/25/18 0030 07/25/18 0110  BP: 124/69 115/66 109/69 (!) 114/56  Pulse: 72 72 68 75  Resp: (!) 27 10 16 17   Temp:      TempSrc:      SpO2: 99% 96% 95% 96%  Weight:      Height:       Wt Readings from Last 3 Encounters:  07/24/18 66.2 kg  07/06/17 66.2 kg  01/17/16 66.7 kg    PHYSICAL EXAMINATION:  Physical Exam  Vitals reviewed. Constitutional: He is oriented to person, place, and time. He appears well-developed and well-nourished. No distress.  HENT:  Head: Normocephalic and atraumatic.  Mouth/Throat: Oropharynx is clear and moist.  Eyes: Pupils are equal, round, and reactive to light. Conjunctivae and EOM are normal. No scleral icterus.  Neck: Normal range of motion. Neck supple. No JVD present. No thyromegaly present.  Cardiovascular: Normal rate, regular rhythm and intact distal  pulses. Exam reveals no gallop and no friction rub.  No murmur heard. Respiratory: Effort normal and breath sounds normal. No respiratory distress. He has no wheezes. He has no rales.  GI: Soft. Bowel sounds are normal. He exhibits no distension. There is no tenderness.  Musculoskeletal: Normal range of motion. He exhibits no edema.  No arthritis, no gout  Lymphadenopathy:    He has no cervical adenopathy.  Neurological: He is alert and oriented to person, place, and time. No cranial nerve deficit.  No dysarthria, no aphasia  Skin: Skin is warm and dry. No rash noted. No erythema.  Psychiatric: He has a normal mood and affect. His behavior is normal. Judgment and thought content normal.    LABORATORY PANEL:   CBC Recent Labs  Lab 07/24/18 2236  WBC 12.1*  HGB 16.0  HCT 45.0  PLT 192   ------------------------------------------------------------------------------------------------------------------  Chemistries  Recent Labs  Lab 07/24/18 2236  NA 138  K 3.8  CL 102  CO2 28  GLUCOSE 126*  BUN 14  CREATININE 1.15  CALCIUM 9.7   ------------------------------------------------------------------------------------------------------------------  Cardiac Enzymes Recent Labs  Lab 07/24/18 2236  TROPONINI 1.77*   ------------------------------------------------------------------------------------------------------------------  RADIOLOGY:  Dg Chest Port 1 View  Result Date: 07/24/2018 CLINICAL DATA:  Left chest pain. EXAM: PORTABLE CHEST 1 VIEW COMPARISON:  None. FINDINGS: The heart size and mediastinal contours are within normal limits. Both lungs are clear. The visualized skeletal structures are unremarkable. IMPRESSION: No active disease. Electronically Signed   By: Gerome Sam III M.D   On: 07/24/2018 22:52    EKG:   Orders placed or performed during the hospital encounter of 07/24/18  . EKG 12-Lead  . EKG 12-Lead  . ED EKG within 10 minutes  . ED EKG  within 10 minutes  . EKG 12-Lead  . EKG 12-Lead  . ED EKG  . ED EKG  . EKG 12-Lead immediately post procedure  . EKG 12-Lead  . EKG 12-Lead immediately post procedure  . EKG 12-Lead    IMPRESSION AND PLAN:  Principal Problem:   Non-ST elevation (NSTEMI) myocardial infarction [facility]) -taken to Cath Lab by ED tonight, see HPI for details.  Successful stent placement.  Monitor in ICU for now post cath, cardiology following, defer to their recommendations for further management of this problem  Chart review performed and case discussed with ED provider. Labs, imaging and/or ECG reviewed by provider and discussed with patient/family. Management plans discussed with the patient and/or family.  DVT PROPHYLAXIS: SubQ lovenox   GI PROPHYLAXIS:  None  ADMISSION STATUS: Inpatient     CODE STATUS: Full    Code Status Orders  (From admission, onward)         Start     Ordered   07/25/18 0314  Full code  Continuous     07/25/18 0313        Code Status History    This patient has a current code status but no historical  code status.      TOTAL TIME TAKING CARE OF THIS PATIENT: 45 minutes.   Dorthy Hustead FIELDING 07/25/2018, 3:21 AM  [employer]  [phone]  CC: Primary care physician; Patient, No Pcp Per  Note:  This document was prepared using Dragon voice recognition software and may include unintentional dictation errors.

## 2018-07-25 NOTE — Progress Notes (Signed)
Patient refusing to stay in the hospital- patient states he wants to leave and go home today.  Patient refusing to wear heart monitor and defib pads.  He states he will continue to smoke and that he will not take his blood thinners when he gets home.  He states that "he will not be on any medication for the rest of his life."  I notified Dr. Kirke CorinArida of this and he stated he would come by and talk to the patient again.

## 2018-07-25 NOTE — Consult Note (Signed)
Cardiology Consultation:   Patient ID: Thomas Romero MRN: [id number]; DOB: 10-31-1965  Admit date: 07/24/2018 Date of Consult: 07/25/2018  Primary Care Provider: Patient, No Pcp Per Primary Cardiologist: new Kirke Corin) Primary Electrophysiologist:  None    Patient Profile:   Thomas Romero is a 52 y.o. male with a hx of tobacco use and stomach ulcers who is being seen today for the evaluation of chest pain and abnormal EKG at the request of Dr. Lamont Snowball.  History of Present Illness:   Thomas Romero is a 52 year old male with no prior cardiac history.  He has known history of anxiety and depression in addition to tobacco use and previous stomach ulcers right required surgery in 2010.  He has not seen a physician in many years.  He presented with chest pain that was intermittent all day Sunday according to his wife.  However, the pain became constant around 8 PM.  It was substernal described as tightness feeling associated with shortness of breath.  There was no radiation.  The patient came to the emergency room.  His EKG showed 1 mm of ST elevation in aVR with diffuse ST depression.  The EKG was nondiagnostic for ST elevation.  The decision was made to treat him medically and control his pain.  He was started on unfractionated heparin, aspirin and nitroglycerin drip.  However, he continued to have chest pain in spite of this.  Repeat EKG with posterior leads showed borderline ST elevation in the posterior leads.  Again this did not meet criteria for a STEMI.  However, given continued symptoms of chest pain in spite of medical therapy, we decided to proceed with urgent cardiac catheterization.  Past Medical History:  Diagnosis Date  . Gastric ulcer   . Manic depression (HCC)   . Panic attacks     Past Surgical History:  Procedure Laterality Date  . SMALL INTESTINE SURGERY       Home Medications:  Prior to Admission medications   Medication Sig Start Date End Date Taking? Authorizing  Provider  HYDROcodone-acetaminophen (NORCO/VICODIN) 5-325 MG tablet Take 1 tablet by mouth every 4 (four) hours as needed for moderate pain. Patient not taking: Reported on 07/25/2018 07/06/17   Cuthriell, Delorise Royals, PA-C  predniSONE (DELTASONE) 10 MG tablet Take 1 tablet (10 mg total) by mouth daily. Patient not taking: Reported on 07/25/2018 07/06/17   Cuthriell, Delorise Royals, PA-C    Inpatient Medications: Scheduled Meds: . aspirin      . nicotine  21 mg Transdermal Once  . nitroGLYCERIN       Continuous Infusions: . heparin 800 Units/hr (07/24/18 2331)  . nitroGLYCERIN Stopped (07/25/18 0214)   PRN Meds: nitroGLYCERIN  Allergies:    Allergies  Allergen Reactions  . Ibuprofen Other (See Comments)    Gastric ulcers    Social History:   Social History   Socioeconomic History  . Marital status: Married    Spouse name: Not on file  . Number of children: Not on file  . Years of education: Not on file  . Highest education level: Not on file  Occupational History  . Not on file  Social Needs  . Financial resource strain: Not on file  . Food insecurity:    Worry: Not on file    Inability: Not on file  . Transportation needs:    Medical: Not on file    Non-medical: Not on file  Tobacco Use  . Smoking status: Current Some Day Smoker  . Smokeless  tobacco: Never Used  Substance and Sexual Activity  . Alcohol use: Yes  . Drug use: No  . Sexual activity: Not on file  Lifestyle  . Physical activity:    Days per week: Not on file    Minutes per session: Not on file  . Stress: Not on file  Relationships  . Social connections:    Talks on phone: Not on file    Gets together: Not on file    Attends religious service: Not on file    Active member of club or organization: Not on file    Attends meetings of clubs or organizations: Not on file    Relationship status: Not on file  . Intimate partner violence:    Fear of current or ex partner: Not on file    Emotionally  abused: Not on file    Physically abused: Not on file    Forced sexual activity: Not on file  Other Topics Concern  . Not on file  Social History Narrative  . Not on file    Family History:   The patient's family history is remarkable for extensive coronary artery disease and almost all family members from his father side.  ROS:  Please see the history of present illness.   All other ROS reviewed and negative.     Physical Exam/Data:   Vitals:   07/24/18 2340 07/25/18 0000 07/25/18 0030 07/25/18 0110  BP: 124/69 115/66 109/69 (!) 114/56  Pulse: 72 72 68 75  Resp: (!) 27 10 16 17   Temp:      TempSrc:      SpO2: 99% 96% 95% 96%  Weight:      Height:        Intake/Output Summary (Last 24 hours) at 07/25/2018 0303 Last data filed at 07/25/2018 0248 Gross per 24 hour  Intake -  Output 200 ml  Net -200 ml   Filed Weights   07/24/18 2227  Weight: 66.2 kg   Body mass index is 20.94 kg/m.  General:  Well nourished, well developed, in no acute distress HEENT: normal Lymph: no adenopathy Neck: no JVD Endocrine:  No thryomegaly Vascular: No carotid bruits; FA pulses 2+ bilaterally without bruits  Cardiac:  normal S1, S2; RRR; no murmur  Lungs:  clear to auscultation bilaterally, no wheezing, rhonchi or rales  Abd: soft, nontender, no hepatomegaly  Ext: no edema Musculoskeletal:  No deformities, BUE and BLE strength normal and equal Skin: warm and dry  Neuro:  CNs 2-12 intact, no focal abnormalities noted Psych:  Normal affect   EKG:  The EKG was personally reviewed and demonstrates: Normal sinus rhythm with 1 mm of ST elevation in aVR with ST depression in the lateral leads. Telemetry:  Telemetry was personally reviewed and demonstrates: Normal sinus rhythm with one episode of accelerated junctional rhythm  Relevant CV Studies:   Laboratory Data:  Chemistry Recent Labs  Lab 07/24/18 2236  NA 138  K 3.8  CL 102  CO2 28  GLUCOSE 126*  BUN 14  CREATININE  1.15  CALCIUM 9.7  GFRNONAA >60  GFRAA >60  ANIONGAP 8    No results for input(s): PROT, ALBUMIN, AST, ALT, ALKPHOS, BILITOT in the last 168 hours. Hematology Recent Labs  Lab 07/24/18 2236  WBC 12.1*  RBC 4.98  HGB 16.0  HCT 45.0  MCV 90.4  MCH 32.1  MCHC 35.6  RDW 12.4  PLT 192   Cardiac Enzymes Recent Labs  Lab 07/24/18 2236  TROPONINI  1.77*   No results for input(s): TROPIPOC in the last 168 hours.  BNPNo results for input(s): BNP, PROBNP in the last 168 hours.  DDimer No results for input(s): DDIMER in the last 168 hours.  Radiology/Studies:  Dg Chest Port 1 View  Result Date: 07/24/2018 CLINICAL DATA:  Left chest pain. EXAM: PORTABLE CHEST 1 VIEW COMPARISON:  None. FINDINGS: The heart size and mediastinal contours are within normal limits. Both lungs are clear. The visualized skeletal structures are unremarkable. IMPRESSION: No active disease. Electronically Signed   By: Gerome Samavid  Williams III M.D   On: 07/24/2018 22:52    Assessment and Plan:   1. Non-ST elevation myocardial infarction: Borderline EKG changes that do not meet criteria for ST elevation myocardial infarction.  However, given the patient's continued chest pain in spite of medical therapy, urgent cardiac catheterization was recommended.  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  There is moderate left circumflex disease which is a small vessel.  Medical therapy is recommended.  Catheter induced spasm was noted in the right coronary artery in spite of multiple doses of intracoronary nitroglycerin.  I am going to check urine drug screen.  I requested an echocardiogram.  Recommend dual antiplatelet therapy for at least 12 months.  The patient does not have good history of compliance and thus I recommend that we make sure he has Brilinta before he goes home. 2. Tobacco use: Smoking cessation is  advised. 3. Hyperlipidemia: I started high-dose atorvastatin. 4. History of stomach ulcers which was treated with surgery.  I will add Protonix given the need for dual antiplatelet therapy.   For questions or updates, please contact [facility] Please consult [email or web address] for contact info under     Signed, Lorine BearsMuhammad Ewen Varnell, MD  07/25/2018 3:03 AM

## 2018-07-25 NOTE — Progress Notes (Signed)
Patient left against medical advice- form is signed.  Dr. Duanne LimerickSamaan and Dr. Kirke CorinArida both informed patient the risks of him leaving this early after procedure.  IV's and TR band removed.  Patient given a list of his medications that he needs to pick up [facility]  Patient left with auxillary by wheelchair.

## 2018-07-25 NOTE — Consult Note (Signed)
PULMONARY / CRITICAL CARE MEDICINE  Name: Thomas Romero MRN: [id number] DOB: 08-19-1966    LOS: 0  Referring Provider: Dr. Anne Hahn Reason for Referral: See elevation MI, status post left heart catheterization Brief patient description: 52 year old Caucasian male, heavy smoker who presented to the ED with chest pain, ruled in for an ST elevation MI and was taken to the Cath Lab for an emergent left heart catheterization with stent placement to the mid RCA.  HPI: This is a 52 year old Caucasian male with a past medical history as indicated below, current everyday smoker who presented to the ED with sudden onset chest pain.  His initial EKG showed ischemic changes with no profound ST changes.  However in the ED, his chest pain got worse and he developed ST changes on EKG suggestive of an acute ST elevation MI.  He was taken for an emergent left heart catheterization with stent placement to the RCA.  He is now status post cath and offers no complaints.  He reports complete resolution of chest pain.  Past Medical History:  Diagnosis Date  . Gastric ulcer   . Manic depression (HCC)   . Panic attacks    Past Surgical History:  Procedure Laterality Date  . SMALL INTESTINE SURGERY     Prior to Admission medications   Medication Sig Start Date End Date Taking? Authorizing Provider  amLODipine (NORVASC) 5 MG tablet Take 5 mg by mouth daily.   Yes [provider]  clopidogrel (PLAVIX) 75 MG tablet Take 75 mg by mouth daily.   Yes [provider]  donepezil (ARICEPT) 5 MG tablet Take 1 tablet (5 mg total) by mouth at bedtime. 05/02/18 06/11/18 Yes Sowles, Danna Hefty, MD  empagliflozin (JARDIANCE) 25 MG TABS tablet Take 25 mg by mouth daily.   Yes [provider]  glycopyrrolate (ROBINUL) 1 MG tablet Take 1 mg by mouth 2 (two) times daily.   Yes [provider]  insulin aspart (NOVOLOG FLEXPEN) 100 UNIT/ML FlexPen Inject 12 Units into the skin 2 (two) times daily.    Yes [provider]  insulin aspart (NOVOLOG) 100 UNIT/ML FlexPen Inject 18 Units into the skin daily. At 1700   Yes [provider]  Insulin Degludec-Liraglutide (XULTOPHY) 100-3.6 UNIT-MG/ML SOPN Inject 50 Units into the skin daily.   Yes [provider]  levETIRAcetam (KEPPRA) 500 MG tablet Take 500 mg by mouth 2 (two) times daily.   Yes [provider]  lipase/protease/amylase (CREON) 12000 units CPEP capsule Take 6,000 Units by mouth 3 (three) times daily before meals.   Yes [provider]  lipase/protease/amylase (CREON) 12000 units CPEP capsule Take 3,000 Units by mouth at bedtime. With snack   Yes [provider]  lisinopril (PRINIVIL,ZESTRIL) 5 MG tablet Take 5 mg by mouth daily.   Yes [provider]  metoprolol succinate (TOPROL-XL) 25 MG 24 hr tablet Take 1 tablet (25 mg total) by mouth daily. 05/02/18  Yes Sowles, Danna Hefty, MD  rosuvastatin (CRESTOR) 40 MG tablet Take 1 tablet (40 mg total) by mouth daily. 05/02/18 06/11/18 Yes Alba Cory, MD  aspirin EC 81 MG tablet Take 81 mg by mouth daily.    [provider]  famotidine (PEPCID) 20 MG tablet Take 1 tablet (20 mg total) by mouth 2 (two) times daily. 05/02/18 06/01/18  Alba Cory, MD  gabapentin (NEURONTIN) 300 MG capsule Take 1 capsule (300 mg total) by mouth 2 (two) times daily. 05/02/18 06/01/18  Alba Cory, MD  insulin glargine (LANTUS) 100  UNIT/ML injection Inject 0.1 mLs (10 Units total) into the skin daily. 05/02/18 06/01/18  Alba Cory, MD  lacosamide 100 MG TABS Take 1 tablet (100 mg total) by mouth 2 (two) times daily. Patient not taking: Reported on 06/11/2018 09/17/17   Enedina Finner, MD  promethazine (PHENERGAN) 12.5 MG tablet Take 1 tablet (12.5 mg total) by mouth every 6 (six) hours as needed for nausea or vomiting. Patient not taking: Reported on 06/11/2018 07/06/17   Almond Lint, MD  sertraline (ZOLOFT) 25 MG tablet Take 1 tablet (25 mg  total) by mouth daily. Patient not taking: Reported on 06/11/2018 05/02/18   Alba Cory, MD   Allergies Allergies  Allergen Reactions  . Ibuprofen Other (See Comments)    Gastric ulcers    Family History No family history on file. Social History  reports that he has been smoking. He has never used smokeless tobacco. He reports that he drinks alcohol. He reports that he does not use drugs.  Review Of Systems:   Constitutional: Denies fever and chills. HEENT: Negative for headaches, visual changes and hearing loss Respiratory: Negative for cough and shortness of breath Cardiovascular: Positive for chest pain that has resolved but negative for orthopnea and palpitations Gastrointestinal: Negative for nausea vomiting diarrhea and abdominal pain Genitourinary: Negative for urinary symptoms Musculoskeletal negative for joint pain and swelling Skin: Negative for rash and lesions Neurological: Negative for dizziness and weakness Endocrine: Negative for polyuria polydipsia and polyphagia Hematology and oncology: Negative for any blood disorders and is a bleeding Psychological negative for depression but reports anxiety  VITAL SIGNS: BP 123/74   Pulse 71   Temp (P) 98.2 F (36.8 C) (Oral)   Resp 19   Ht 5\' 10"  (1.778 m)   Wt 66.2 kg   SpO2 96%   BMI 20.94 kg/m   HEMODYNAMICS:    VENTILATOR SETTINGS:    INTAKE / OUTPUT: No intake/output data recorded.  PHYSICAL EXAMINATION: General: Older for age, in no acute distress HEENT: PERRLA, trachea midline, no JVD Neuro: Alert and oriented x4, cranial nerves intact Cardiovascular: Pulse regular, S1-S2, no murmur regurg or gallop, +2 pulses bilaterally, no edema Lungs: Clear to auscultation bilaterally, with breath sounds diminished in the bases bilaterally Abdomen: Nondistended, normal bowel sounds in all 4 quadrants, palpation reveals no organomegaly Musculoskeletal: Positive range of motion, no joint deformities Skin: Warm  and dry  LABS:  BMET Recent Labs  Lab 07/24/18 2236  NA 138  K 3.8  CL 102  CO2 28  BUN 14  CREATININE 1.15  GLUCOSE 126*    Electrolytes Recent Labs  Lab 07/24/18 2236  CALCIUM 9.7    CBC Recent Labs  Lab 07/24/18 2236  WBC 12.1*  HGB 16.0  HCT 45.0  PLT 192    Coag's Recent Labs  Lab 07/24/18 2236  APTT 29  INR 0.89    Sepsis Markers No results for input(s): LATICACIDVEN, PROCALCITON, O2SATVEN in the last 168 hours.  ABG No results for input(s): PHART, PCO2ART, PO2ART in the last 168 hours.  Liver Enzymes No results for input(s): AST, ALT, ALKPHOS, BILITOT, ALBUMIN in the last 168 hours.  Cardiac Enzymes Recent Labs  Lab 07/24/18 2236  TROPONINI 1.77*    Glucose No results for input(s): GLUCAP in the last 168 hours.  Imaging Dg Chest Port 1 View  Result Date: 07/24/2018 CLINICAL DATA:  Left chest pain. EXAM: PORTABLE CHEST 1 VIEW COMPARISON:  None. FINDINGS: The heart size and mediastinal contours are within normal limits.  Both lungs are clear. The visualized skeletal structures are unremarkable. IMPRESSION: No active disease. Electronically Signed   By: Gerome Samavid  Williams III M.D   On: 07/24/2018 22:52     STUDIES:  2D echo pending   SIGNIFICANT EVENTS: 07/24/2018: ED with chest pain, admitted with STEMI  LINES/TUBES: Peripheral IVs  ASSESSMENT  ST elevation MI, status post left heart catheterization Tobacco use disorder Anxiety Depression GERD  PLAN Hemodynamic monitoring per ICU protocol Treatment plan per cardiology Nicotine patch Xanax 0.5 mg 3 times daily as needed for anxiety Resume all home medications Follow-up troponins and EKGs post left heart catheterization  Best Practice: Code Status: Full code Diet: Heart healthy diet GI prophylaxis: Indicated VTE prophylaxis: Subcu Lovenox  FAMILY  - Updates: Family at bedside.  Will update when available   S. [facility] ANP-BC Pulmonary and Critical Care  Medicine [facility] Pager [phone] or [phone]  NB: This document was prepared using Dragon voice recognition software and may include unintentional dictation errors.    07/25/2018, 3:53 AM

## 2018-07-25 NOTE — Progress Notes (Signed)
The patient does not want to stay in the hospital he wants to leave AGAINST MEDICAL ADVICE.  He wants to smoke.  I explained to him and his significant other the risks of leaving early after myocardial infarction and also the risk of stent thrombosis if he does not take his medications. The patient claims that he is going to take his medications if we send him prescriptions but he wants to " relax at home".  I again explained to him the risk of sudden death.  He seems to understand the risks of leaving early but he wants to leave anyway. I sent him prescriptions for aspirin, Brilinta and atorvastatin.  I also provided him with my card with contact information.

## 2018-07-25 NOTE — Progress Notes (Addendum)
[facility] [place] [facility]   PATIENT NAME: Thomas Romero    MR#:  [id number]  DATE OF BIRTH:  23-Apr-1966  SUBJECTIVE:  CHIEF COMPLAINT:   Chief Complaint  Patient presents with  . Chest Pain   - s/p urgent catheterization earlier this morning and PCI placed for 91% RCA stenosis. -Resting well, anxious at baseline.  Denies any chest pain  REVIEW OF SYSTEMS:  Review of Systems  Constitutional: Negative for chills, fever and malaise/fatigue.  HENT: Negative for congestion, ear discharge, hearing loss and nosebleeds.   Eyes: Negative for blurred vision.  Respiratory: Negative for cough, shortness of breath and wheezing.   Cardiovascular: Negative for chest pain and palpitations.  Gastrointestinal: Negative for abdominal pain, constipation, diarrhea, nausea and vomiting.  Genitourinary: Negative for dysuria.  Musculoskeletal: Negative for myalgias.  Neurological: Negative for dizziness, focal weakness, seizures, weakness and headaches.  Psychiatric/Behavioral: Negative for depression.    DRUG ALLERGIES:   Allergies  Allergen Reactions  . Ibuprofen Other (See Comments)    Gastric ulcers    VITALS:  Blood pressure 125/80, pulse 71, temperature 98.2 F (36.8 C), temperature source Oral, resp. rate 12, height 5\' 10"  (1.778 m), weight 66.2 kg, SpO2 100 %.  PHYSICAL EXAMINATION:  Physical Exam   GENERAL:  52 y.o.-year-old patient lying in the bed with no acute distress.  EYES: Pupils equal, round, reactive to light and accommodation. No scleral icterus. Extraocular muscles intact.  HEENT: Head atraumatic, normocephalic. Oropharynx and nasopharynx clear.  NECK:  Supple, no jugular venous distention. No thyroid enlargement, no tenderness.  LUNGS: Normal breath sounds bilaterally, no wheezing, rales,rhonchi or crepitation. No use of accessory muscles of respiration.  CARDIOVASCULAR: S1, S2 normal. No murmurs, rubs, or gallops.  ABDOMEN: Soft,  nontender, nondistended. Bowel sounds present. No organomegaly or mass.  EXTREMITIES: No pedal edema, cyanosis, or clubbing. Right radial artery cath site looks clean. NEUROLOGIC: Cranial nerves II through XII are intact. Muscle strength 5/5 in all extremities. Sensation intact. Gait not checked.  PSYCHIATRIC: The patient is alert and oriented x 3.  SKIN: No obvious rash, lesion, or ulcer.    LABORATORY PANEL:   CBC Recent Labs  Lab 07/25/18 0359  WBC 10.9*  HGB 13.9  HCT 40.5  PLT 171   ------------------------------------------------------------------------------------------------------------------  Chemistries  Recent Labs  Lab 07/25/18 0359  NA 138  K 3.3*  CL 106  CO2 26  GLUCOSE 139*  BUN 15  CREATININE 0.94  CALCIUM 8.8*  MG 2.1  AST 72*  ALT 22  ALKPHOS 74  BILITOT 0.8   ------------------------------------------------------------------------------------------------------------------  Cardiac Enzymes Recent Labs  Lab 07/25/18 0359  TROPONINI 18.51*   ------------------------------------------------------------------------------------------------------------------  RADIOLOGY:  Dg Chest Port 1 View  Result Date: 07/24/2018 CLINICAL DATA:  Left chest pain. EXAM: PORTABLE CHEST 1 VIEW COMPARISON:  None. FINDINGS: The heart size and mediastinal contours are within normal limits. Both lungs are clear. The visualized skeletal structures are unremarkable. IMPRESSION: No active disease. Electronically Signed   By: Gerome Sam III M.D   On: 07/24/2018 22:52    EKG:   Orders placed or performed during the hospital encounter of 07/24/18  . EKG 12-Lead  . EKG 12-Lead  . ED EKG within 10 minutes  . ED EKG within 10 minutes  . EKG 12-Lead  . EKG 12-Lead  . ED EKG  . ED EKG  . EKG 12-Lead immediately post procedure  . EKG 12-Lead  . EKG 12-Lead immediately post procedure  .  EKG 12-Lead  . EKG 12-Lead  . EKG 12-Lead    ASSESSMENT AND PLAN:    52 year old male with past medical history significant for panic attacks, anxiety depression and GERD presents to hospital secondary to chest pain.  1.  NSTEMI-presented with ST elevation in posterior leads with persistent chest pain in spite of medical management and IV heparin- -underwent urgent cardiac catheterization that showed 99% RCA stenosis.  Status post angioplasty and PCI placement -Appreciate cardiology consult -Aspirin, Brilinta, statin- LDL>150 -Follow-up echocardiogram.  Urine tox screen is negative  2.  Hypertension-metoprolol  3.  GERD-Protonix.  History of peptic ulcer disease  4.  Tobacco use disorder-on nicotine patch  5.  DVT prophylaxis-on Lovenox  6. Hypokalemia- replaced     All the records are reviewed and case discussed with Care Management/Social Workerr. Management plans discussed with the patient, family and they are in agreement.  CODE STATUS: Full Code  TOTAL TIME TAKING CARE OF THIS PATIENT: 38 minutes.   POSSIBLE D/C IN 1-2  DAYS, DEPENDING ON CLINICAL CONDITION.   Enid BaasKALISETTI,Larone Kliethermes M.D on 07/25/2018 at 7:47 AM  Between 7am to 6pm - Pager - [phone]  After 6pm go to [email or web address] - password [email or web address]  [facility]  Office  [phone]  CC: Primary care physician; Patient, No Pcp Per

## 2018-07-25 NOTE — Discharge Summary (Signed)
   Name: Thomas Romero MRN: [id number] DOB: May 13, 1966     CONSULTATION DATE: 07/24/2018 Subjective & Objectives: Afebrile and no chest pain  PAST MEDICAL HISTORY :   has a past medical history of Gastric ulcer, Manic depression (HCC), and Panic attacks.  has a past surgical history that includes Small intestine surgery; Coronary/Graft Acute MI Revascularization (N/A, 07/25/2018); and LEFT HEART CATH AND CORONARY ANGIOGRAPHY (N/A, 07/25/2018). Prior to Admission medications   Medication Sig Start Date End Date Taking? Authorizing Provider  aspirin 81 MG chewable tablet Chew 1 tablet (81 mg total) by mouth daily. 07/26/18   Iran OuchArida, Muhammad A, MD  atorvastatin (LIPITOR) 80 MG tablet Take 1 tablet (80 mg total) by mouth daily at 6 PM. 07/25/18   Iran OuchArida, Muhammad A, MD  HYDROcodone-acetaminophen (NORCO/VICODIN) 5-325 MG tablet Take 1 tablet by mouth every 4 (four) hours as needed for moderate pain. Patient not taking: Reported on 07/25/2018 07/06/17   Cuthriell, Delorise RoyalsJonathan D, PA-C  pantoprazole (PROTONIX) 40 MG tablet Take 1 tablet (40 mg total) by mouth daily. 07/26/18   Iran OuchArida, Muhammad A, MD  predniSONE (DELTASONE) 10 MG tablet Take 1 tablet (10 mg total) by mouth daily. Patient not taking: Reported on 07/25/2018 07/06/17   Cuthriell, Delorise RoyalsJonathan D, PA-C  ticagrelor (BRILINTA) 90 MG TABS tablet Take 1 tablet (90 mg total) by mouth 2 (two) times daily. 07/25/18   Iran OuchArida, Muhammad A, MD   Allergies  Allergen Reactions  . Ibuprofen Other (See Comments)    Gastric ulcers    FAMILY HISTORY:  family history is not on file. SOCIAL HISTORY:  reports that he has been smoking. He has never used smokeless tobacco. He reports that he drinks alcohol. He reports that he does not use drugs.  REVIEW OF SYSTEMS:   Unable to obtain due to critical illness   VITAL SIGNS: Temp:  [97.5 F (36.4 C)-99.3 F (37.4 C)] 99.3 F (37.4 C) (11/18 0745) Pulse Rate:  [55-82] 71 (11/18 0745) Resp:  [10-27] 15  (11/18 0745) BP: (103-127)/(56-85) 125/80 (11/18 0745) SpO2:  [95 %-100 %] 100 % (11/18 0745) Weight:  [66.2 kg] 66.2 kg (11/17 2227)  Physical Examination:  A + O x 3 and no acute focal neuro deficits On RA, no distress, able to talk in full sentences. BEAE and no rales. S1 & S2 audible and no murmur Benign abdomen and normal peristalses No edema  Admission diagnosis included:  STEMI s/p  PCI to RCA for 99% occlusion.  DM  Manic depression and  Panic attacks  HTN  Hypokalemia  Full code  Patient left AMA and he was made aware of the risk of leaving against medical advice which may include sudden death. He insisted to leave and acknowledged that he is aware of the risk of leaving AMA.

## 2018-07-25 NOTE — ED Notes (Signed)
To Cath lab 

## 2018-07-26 ENCOUNTER — Telehealth: Payer: Self-pay | Admitting: *Deleted

## 2018-07-26 NOTE — Telephone Encounter (Signed)
-----   Message from Iran OuchMuhammad A Arida, MD sent at 07/25/2018  4:09 PM EST ----- This patient presented with myocardial infarction at 1:00 this morning and I placed a stent in the right coronary artery.  Unfortunately, he left AMA at 11 AM because he wanted to smoke. [facility] [address]  Can you please call the patient and make sure he was able to get these medications?  He also needs a follow-up appointment within 1 week.

## 2018-07-26 NOTE — Telephone Encounter (Signed)
Patient confirmed he picked up his medications from the pharmacy and appointment scheduled for 08/02/18 with Arida. Verbalized understanding

## 2018-08-02 ENCOUNTER — Encounter: Payer: Self-pay | Admitting: Cardiovascular Disease

## 2018-08-02 ENCOUNTER — Ambulatory Visit (INDEPENDENT_AMBULATORY_CARE_PROVIDER_SITE_OTHER): Payer: Medicare HMO | Admitting: Cardiovascular Disease

## 2018-08-02 VITALS — BP 110/72 | HR 73 | Ht 70.0 in | Wt 140.5 lb

## 2018-08-02 DIAGNOSIS — I214 Non-ST elevation (NSTEMI) myocardial infarction: Secondary | ICD-10-CM | POA: Diagnosis not present

## 2018-08-02 DIAGNOSIS — E785 Hyperlipidemia, unspecified: Secondary | ICD-10-CM

## 2018-08-02 DIAGNOSIS — Z720 Tobacco use: Secondary | ICD-10-CM | POA: Diagnosis not present

## 2018-08-02 MED ORDER — NITROGLYCERIN 0.4 MG SL SUBL: 0.4000 mg | SUBLINGUAL_TABLET | SUBLINGUAL | 1 refills | Status: DC | PRN

## 2018-08-02 NOTE — Patient Instructions (Signed)
Medication Instructions:  Your physician has recommended you make the following change in your medication:  1- Take sublingual Nitrogylcerin 1 tablet (0.4 mg) as needed for chest pain  If you need a refill on your cardiac medications before your next appointment, please call your pharmacy.   Lab work: None ordered   If you have labs (blood work) drawn today and your tests are completely normal, you will receive your results only by: Marland Kitchen. MyChart Message (if you have MyChart) OR . A paper copy in the mail If you have any lab test that is abnormal or we need to change your treatment, we will call you to review the results.  Testing/Procedures: Your physician has requested that you have an echocardiogram. Echocardiography is a painless test that uses sound waves to create images of your heart. It provides your doctor with information about the size and shape of your heart and how well your heart's chambers and valves are working. This procedure takes approximately one hour. There are no restrictions for this procedure.    Follow-Up: At [facility], you and your health needs are our priority.  As part of our continuing mission to provide you with exceptional heart care, we have created designated Provider Care Teams.  These Care Teams include your primary Cardiologist (physician) and Advanced Practice Providers (APPs -  Physician Assistants and Nurse Practitioners) who all work together to provide you with the care you need, when you need it. You will need a follow up appointment in 2 months.  Please call our office 2 months in advance to schedule this appointment.  You may see Lorine BearsMuhammad Arida, MD or one of the following Advanced Practice Providers on your designated Care Team:   Nicolasa Duckinghristopher Berge, NP Eula Listenyan Dunn, PA-C . Marisue IvanJacquelyn Visser, PA-C  [facility].  They will call you for appointment.

## 2018-08-02 NOTE — Progress Notes (Signed)
Cardiology Office Note   Date:  08/02/2018   ID:  Thomas Romero, DOB 1966-02-20, MRN [id number]  PCP:  Patient, No Pcp Per  Cardiologist:   Lorine BearsMuhammad Eli Adami, MD   Chief Complaint  Patient presents with  . other    1 week follow up s/p PCI & stent placement. Meds reviewed by the pt.'s bottles. Pt. c/o right arm swelling since cardiac cath & stent placement.       History of Present Illness: Thomas Romero is a 52 y.o. male who presents for a follow-up visit after recent hospitalization for inferior ST elevation myocardial infarction.  He has prolonged history of tobacco use, previous gastric ulcer, anxiety and depression and hyperlipidemia.  He presented recently with chest pain radiating to his left arm.  EKG showed 1 mm of ST elevation in aVR with diffuse ST depression.  He was initially treated medically but then he continued to have chest pain and thus a repeat EKG was done which showed borderline ST elevation in posterior leads.  Urgent cardiac catheterization was performed which showed 99% stenosis in the mid RCA which was the culprit which was treated successfully with PCI and drug-eluting stent placement.  There was moderate mid left circumflex disease that was left to be treated medically. The patient left AGAINST MEDICAL ADVICE on the same day of his presentation as he wanted to smoke and did not want to stay.  I sent his prescriptions to his pharmacy and he was able to get all of them.  He reports no recurrent chest pain or arm pain.  No significant shortness of breath.  He cut down on tobacco use but continues to smoke.    Past Medical History:  Diagnosis Date  . Coronary artery disease    Non-ST elevation myocardial infarction in November 2019.  Cardiac catheterization showed 99% stenosis in the mid RCA treated successfully with PCI and drug-eluting stent placement in moderate mid left circumflex disease.  . Gastric ulcer   . Hyperlipidemia   . Manic depression (HCC)   .  Panic attacks   . Tobacco use     Past Surgical History:  Procedure Laterality Date  . CORONARY/GRAFT ACUTE MI REVASCULARIZATION N/A 07/25/2018   Procedure: Coronary/Graft Acute MI Revascularization;  Surgeon: Iran OuchArida, Riddik Senna A, MD;  Location: [facility]IVE [facility];  Service: Cardiovascular;  Laterality: N/A;  . LEFT HEART CATH AND CORONARY ANGIOGRAPHY N/A 07/25/2018   Procedure: LEFT HEART CATH AND CORONARY ANGIOGRAPHY;  Surgeon: Iran OuchArida, Mera Gunkel A, MD;  Location: [facility] CV [facility];  Service: Cardiovascular;  Laterality: N/A;  . SMALL INTESTINE SURGERY       Current Outpatient Medications  Medication Sig Dispense Refill  . aspirin 81 MG chewable tablet Chew 1 tablet (81 mg total) by mouth daily. 90 tablet 0  . atorvastatin (LIPITOR) 80 MG tablet Take 1 tablet (80 mg total) by mouth daily at 6 PM. 30 tablet 6  . pantoprazole (PROTONIX) 40 MG tablet Take 1 tablet (40 mg total) by mouth daily. 30 tablet 6  . ticagrelor (BRILINTA) 90 MG TABS tablet Take 1 tablet (90 mg total) by mouth 2 (two) times daily. 60 tablet 6  . nitroGLYCERIN (NITROSTAT) 0.4 MG SL tablet Place 1 tablet (0.4 mg total) under the tongue every 5 (five) minutes as needed for chest pain. 25 tablet 1   No current facility-administered medications for this visit.     Allergies:   Ibuprofen    Social History:  The patient  reports that he has been smoking cigarettes. He has a 54.00 pack-year smoking history. He has never used smokeless tobacco. He reports that he drinks alcohol. He reports that he does not use drugs.   Family History:  The patient's family history includes Heart attack in his brother, mother, and sister; Stroke in his father.    ROS:  Please see the history of present illness.   Otherwise, review of systems are positive for none.   All other systems are reviewed and negative.    PHYSICAL EXAM: VS:  BP 110/72 (BP Location: Left Arm, Patient Position: Sitting, Cuff Size: Normal)   Pulse 73   Ht 5'  10" (1.778 m)   Wt 140 lb 8 oz (63.7 kg)   BMI 20.16 kg/m  , BMI Body mass index is 20.16 kg/m. GEN: Well nourished, well developed, in no acute distress  HEENT: normal  Neck: no JVD, carotid bruits, or masses Cardiac: RRR; no murmurs, rubs, or gallops,no edema  Respiratory:  clear to auscultation bilaterally, normal work of breathing GI: soft, nontender, nondistended, + BS MS: no deformity or atrophy  Skin: warm and dry, no rash Neuro:  Strength and sensation are intact Psych: euthymic mood, full affect Right ulnar pulses normal with no hematoma   EKG:  EKG is ordered today. The ekg ordered today demonstrates normal sinus rhythm with old inferior infarct.   Recent Labs: 07/25/2018: ALT 22; BUN 15; Creatinine, Ser 0.94; Hemoglobin 13.9; Magnesium 2.1; Platelets 171; Potassium 3.3; Sodium 138    Lipid Panel    Component Value Date/Time   CHOL 203 (H) 07/25/2018 0359   TRIG 134 07/25/2018 0359   HDL 24 (L) 07/25/2018 0359   CHOLHDL 8.5 07/25/2018 0359   VLDL 27 07/25/2018 0359   LDLCALC 152 (H) 07/25/2018 0359      Wt Readings from Last 3 Encounters:  08/02/18 140 lb 8 oz (63.7 kg)  07/24/18 145 lb 15.1 oz (66.2 kg)  07/06/17 146 lb (66.2 kg)      No flowsheet data found.    ASSESSMENT AND PLAN:  1.  Recent non-ST elevation myocardial infarction: Successful PCI and drug-eluting stent placement to the proximal mid RCA.  Continue dual antiplatelet therapy for at least one year.  He has moderate left circumflex disease which will be treated medically. I requested an echocardiogram to evaluate his ejection fraction and see if there is a need for a beta-blocker or ACE inhibitor/ARB. I referred him to cardiac rehab.  2.  Hyperlipidemia: Currently on high-dose atorvastatin.  The plan is to obtain a follow-up lipid and liver profile in 2 months.  3.  Tobacco use: I discussed with him the importance of smoking cessation.    Disposition:   FU with me in 2  months  Signed,  Lorine Bears, MD  08/02/2018 5:21 PM    [facility]

## 2018-08-11 ENCOUNTER — Ambulatory Visit: Payer: Medicare HMO | Admitting: Cardiovascular Disease

## 2018-08-15 ENCOUNTER — Encounter: Payer: Self-pay | Admitting: *Deleted

## 2018-08-15 ENCOUNTER — Encounter: Payer: Medicare HMO | Attending: Cardiovascular Disease | Admitting: *Deleted

## 2018-08-15 VITALS — Ht 68.9 in | Wt 140.6 lb

## 2018-08-15 DIAGNOSIS — Z955 Presence of coronary angioplasty implant and graft: Secondary | ICD-10-CM | POA: Diagnosis not present

## 2018-08-15 DIAGNOSIS — F410 Panic disorder [episodic paroxysmal anxiety] without agoraphobia: Secondary | ICD-10-CM | POA: Diagnosis not present

## 2018-08-15 DIAGNOSIS — F339 Major depressive disorder, recurrent, unspecified: Secondary | ICD-10-CM | POA: Diagnosis not present

## 2018-08-15 DIAGNOSIS — E785 Hyperlipidemia, unspecified: Secondary | ICD-10-CM | POA: Diagnosis not present

## 2018-08-15 DIAGNOSIS — F17210 Nicotine dependence, cigarettes, uncomplicated: Secondary | ICD-10-CM | POA: Diagnosis not present

## 2018-08-15 DIAGNOSIS — I214 Non-ST elevation (NSTEMI) myocardial infarction: Secondary | ICD-10-CM | POA: Diagnosis not present

## 2018-08-15 DIAGNOSIS — I2510 Atherosclerotic heart disease of native coronary artery without angina pectoris: Secondary | ICD-10-CM | POA: Insufficient documentation

## 2018-08-15 NOTE — Patient Instructions (Addendum)
Patient Instructions  Patient Details  Name: Thomas Romero MRN: [id number] Date of Birth: [birth year] Referring Provider:  Iran Ouch, MD  Below are your personal goals for exercise, nutrition, and risk factors. Our goal is to help you stay on track towards obtaining and maintaining these goals. We will be discussing your progress on these goals with you throughout the program.  Initial Exercise Prescription: Initial Exercise Prescription - 08/15/18 1200      Date of Initial Exercise RX and Referring Provider   Date  08/15/18    Referring Provider  Lorine Bears MD      Treadmill   MPH  2.5    Grade  1    Minutes  15    METs  3.26      Recumbant Bike   Level  4    RPM  50    Watts  49    Minutes  15    METs  3.2      T5 Nustep   Level  4    SPM  80    Minutes  15    METs  3      Prescription Details   Frequency (times per week)  3    Duration  Progress to 45 minutes of aerobic exercise without signs/symptoms of physical distress      Intensity   THRR 40-80% of Max Heartrate  104-147    Ratings of Perceived Exertion  11-13    Perceived Dyspnea  0-4      Progression   Progression  Continue to progress workloads to maintain intensity without signs/symptoms of physical distress.      Resistance Training   Training Prescription  Yes    Weight  4 lbs    Reps  10-15       Exercise Goals: Frequency: Be able to perform aerobic exercise two to three times per week in program working toward 2-5 days per week of home exercise.  Intensity: Work with a perceived exertion of 11 (fairly light) - 15 (hard) while following your exercise prescription.  We will make changes to your prescription with you as you progress through the program.   Duration: Be able to do 30 to 45 minutes of continuous aerobic exercise in addition to a 5 minute warm-up and a 5 minute cool-down routine.   Nutrition Goals: Your personal nutrition goals will be established when you do your  nutrition analysis with the dietician.  The following are general nutrition guidelines to follow: Cholesterol < 200mg /day Sodium < 1500mg /day Fiber: Men over 50 yrs - 30 grams per day  Personal Goals: Personal Goals and Risk Factors at Admission - 08/15/18 1241      Core Components/Risk Factors/Patient Goals on Admission    Weight Management  Yes;Weight Maintenance    Intervention  Weight Management: Develop a combined nutrition and exercise program designed to reach desired caloric intake, while maintaining appropriate intake of nutrient and fiber, sodium and fats, and appropriate energy expenditure required for the weight goal.;Weight Management: Provide education and appropriate resources to help participant work on and attain dietary goals.    Admit Weight  140 lb 9.6 oz (63.8 kg)    Expected Outcomes  Short Term: Continue to assess and modify interventions until short term weight is achieved;Long Term: Adherence to nutrition and physical activity/exercise program aimed toward attainment of established weight goal;Weight Maintenance: Understanding of the daily nutrition guidelines, which includes 25-35% calories from fat, 7% or less cal from  saturated fats, less than 200mg  cholesterol, less than 1.5gm of sodium, & 5 or more servings of fruits and vegetables daily;Understanding recommendations for meals to include 15-35% energy as protein, 25-35% energy from fat, 35-60% energy from carbohydrates, less than 200mg  of dietary cholesterol, 20-35 gm of total fiber daily;Understanding of distribution of calorie intake throughout the day with the consumption of 4-5 meals/snacks    Tobacco Cessation  Yes    Number of packs per day  1.5   patient stated he has smoked for the past 45 years and does not plan to quit   Intervention  Assist the participant in steps to quit. Provide individualized education and counseling about committing to Tobacco Cessation, relapse prevention, and pharmacological support  that can be provided by physician.;Education officer, environmental, assist with locating and accessing local/national Quit Smoking programs, and support quit date choice.    Expected Outcomes  Short Term: Will demonstrate readiness to quit, by selecting a quit date.;Short Term: Will quit all tobacco product use, adhering to prevention of relapse plan.;Long Term: Complete abstinence from all tobacco products for at least 12 months from quit date.    Hypertension  Yes    Intervention  Monitor prescription use compliance.;Provide education on lifestyle modifcations including regular physical activity/exercise, weight management, moderate sodium restriction and increased consumption of fresh fruit, vegetables, and low fat dairy, alcohol moderation, and smoking cessation.    Expected Outcomes  Short Term: Continued assessment and intervention until BP is < 140/53mm HG in hypertensive participants. < 130/89mm HG in hypertensive participants with diabetes, heart failure or chronic kidney disease.;Long Term: Maintenance of blood pressure at goal levels.       Tobacco Use Initial Evaluation: Social History   Tobacco Use  Smoking Status Current Every Day Smoker  . Packs/day: 1.50  . Years: 36.00  . Pack years: 54.00  . Types: Cigarettes  Smokeless Tobacco Never Used  Tobacco Comment   Patient 5 PPD; recently cut back to 1.5 daily    Exercise Goals and Review: Exercise Goals    Row Name 08/15/18 1254             Exercise Goals   Increase Physical Activity  Yes       Intervention  Provide advice, education, support and counseling about physical activity/exercise needs.;Develop an individualized exercise prescription for aerobic and resistive training based on initial evaluation findings, risk stratification, comorbidities and participant's personal goals.       Expected Outcomes  Short Term: Attend rehab on a regular basis to increase amount of physical activity.;Long Term: Add in home exercise to  make exercise part of routine and to increase amount of physical activity.;Long Term: Exercising regularly at least 3-5 days a week.       Increase Strength and Stamina  Yes       Intervention  Develop an individualized exercise prescription for aerobic and resistive training based on initial evaluation findings, risk stratification, comorbidities and participant's personal goals.;Provide advice, education, support and counseling about physical activity/exercise needs.       Expected Outcomes  Short Term: Increase workloads from initial exercise prescription for resistance, speed, and METs.;Short Term: Perform resistance training exercises routinely during rehab and add in resistance training at home;Long Term: Improve cardiorespiratory fitness, muscular endurance and strength as measured by increased METs and functional capacity ( )       Able to understand and use rate of perceived exertion (RPE) scale  Yes       Intervention  Provide  education and explanation on how to use RPE scale       Expected Outcomes  Short Term: Able to use RPE daily in rehab to express subjective intensity level;Long Term:  Able to use RPE to guide intensity level when exercising independently       Knowledge and understanding of Target Heart Rate Range (THRR)  Yes       Intervention  Provide education and explanation of THRR including how the numbers were predicted and where they are located for reference       Expected Outcomes  Short Term: Able to state/look up THRR;Short Term: Able to use daily as guideline for intensity in rehab;Long Term: Able to use THRR to govern intensity when exercising independently       Able to check pulse independently  Yes       Intervention  Provide education and demonstration on how to check pulse in carotid and radial arteries.;Review the importance of being able to check your own pulse for safety during independent exercise       Expected Outcomes  Short Term: Able to explain why pulse  checking is important during independent exercise;Long Term: Able to check pulse independently and accurately       Understanding of Exercise Prescription  Yes       Intervention  Provide education, explanation, and written materials on patient's individual exercise prescription       Expected Outcomes  Short Term: Able to explain program exercise prescription;Long Term: Able to explain home exercise prescription to exercise independently          Copy of goals given to participant.

## 2018-08-15 NOTE — Progress Notes (Signed)
Cardiac Individual Treatment Plan  Patient Details  Name: Thomas Romero MRN: [id number] Date of Birth: 06-01-1966 Referring Provider:     Cardiac Rehab from 08/15/2018 in [facility] Cardiac and Pulmonary Rehab  Referring Provider  Kathlyn Sacramento MD      Initial Encounter Date:    Cardiac Rehab from 08/15/2018 in [facility] Cardiac and Pulmonary Rehab  Date  08/15/18      Visit Diagnosis: NSTEMI (non-ST elevated myocardial infarction) [facility])  Status post coronary artery stent placement  Patient's Home Medications on Admission:  Current Outpatient Medications:  .  aspirin 81 MG chewable tablet, Chew 1 tablet (81 mg total) by mouth daily., Disp: 90 tablet, Rfl: 0 .  atorvastatin (LIPITOR) 80 MG tablet, Take 1 tablet (80 mg total) by mouth daily at 6 PM., Disp: 30 tablet, Rfl: 6 .  nitroGLYCERIN (NITROSTAT) 0.4 MG SL tablet, Place 1 tablet (0.4 mg total) under the tongue every 5 (five) minutes as needed for chest pain., Disp: 25 tablet, Rfl: 1 .  pantoprazole (PROTONIX) 40 MG tablet, Take 1 tablet (40 mg total) by mouth daily., Disp: 30 tablet, Rfl: 6 .  ticagrelor (BRILINTA) 90 MG TABS tablet, Take 1 tablet (90 mg total) by mouth 2 (two) times daily., Disp: 60 tablet, Rfl: 6  Past Medical History: Past Medical History:  Diagnosis Date  . Coronary artery disease    Non-ST elevation myocardial infarction in November 2019.  Cardiac catheterization showed 99% stenosis in the mid RCA treated successfully with PCI and drug-eluting stent placement in moderate mid left circumflex disease.  . Gastric ulcer   . Hyperlipidemia   . Manic depression ([place])   . Panic attacks   . Tobacco use     Tobacco Use: Social History   Tobacco Use  Smoking Status Current Every Day Smoker  . Packs/day: 1.50  . Years: 36.00  . Pack years: 54.00  . Types: Cigarettes  Smokeless Tobacco Never Used  Tobacco Comment   Patient 5 PPD; recently cut back to 1.5 daily    Labs: Recent Review Flowsheet Data    Labs  for ITP Cardiac and Pulmonary Rehab Latest Ref Rng & Units 07/25/2018   Cholestrol 0 - 200 mg/dL 203(H)   LDLCALC 0 - 99 mg/dL 152(H)   HDL >40 mg/dL 24(L)   Trlycerides <150 mg/dL 134       Exercise Target Goals: Exercise Program Goal: Individual exercise prescription set using results from initial 6 min walk test and THRR while considering  patient's activity barriers and safety.   Exercise Prescription Goal: Initial exercise prescription builds to 30-45 minutes a day of aerobic activity, 2-3 days per week.  Home exercise guidelines will be given to patient during program as part of exercise prescription that the participant will acknowledge.  Activity Barriers & Risk Stratification: Activity Barriers & Cardiac Risk Stratification - 08/15/18 1240      Activity Barriers & Cardiac Risk Stratification   Activity Barriers  Arthritis;Back Problems;Joint Problems;Deconditioning;Muscular Weakness   left knee injury from high school, still bothers him   Cardiac Risk Stratification  High       6 Minute Walk: 6 Minute Walk    Row Name 08/15/18 1251         6 Minute Walk   Phase  Initial     Distance  1300 feet     Walk Time  6 minutes     # of Rest Breaks  0     MPH  2.46  METS  4.43     RPE  11     VO2 Peak  15.5     Symptoms  No     Resting HR  61 bpm     Resting BP  134/60     Resting Oxygen Saturation   99 %     Exercise Oxygen Saturation  during 6 min walk  100 %     Max Ex. HR  100 bpm     Max Ex. BP  138/62     2 Minute Post BP  122/62        Oxygen Initial Assessment:   Oxygen Re-Evaluation:   Oxygen Discharge (Final Oxygen Re-Evaluation):   Initial Exercise Prescription: Initial Exercise Prescription - 08/15/18 1200      Date of Initial Exercise RX and Referring Provider   Date  08/15/18    Referring Provider  Kathlyn Sacramento MD      Treadmill   MPH  2.5    Grade  1    Minutes  15    METs  3.26      Recumbant Bike   Level  4    RPM  50     Watts  49    Minutes  15    METs  3.2      T5 Nustep   Level  4    SPM  80    Minutes  15    METs  3      Prescription Details   Frequency (times per week)  3    Duration  Progress to 45 minutes of aerobic exercise without signs/symptoms of physical distress      Intensity   THRR 40-80% of Max Heartrate  104-147    Ratings of Perceived Exertion  11-13    Perceived Dyspnea  0-4      Progression   Progression  Continue to progress workloads to maintain intensity without signs/symptoms of physical distress.      Resistance Training   Training Prescription  Yes    Weight  4 lbs    Reps  10-15       Perform Capillary Blood Glucose checks as needed.  Exercise Prescription Changes: Exercise Prescription Changes    Row Name 08/15/18 1200             Response to Exercise   Blood Pressure (Admit)  134/60       Blood Pressure (Exercise)  138/62       Blood Pressure (Exit)  122/62       Heart Rate (Admit)  61 bpm       Heart Rate (Exercise)  100 bpm       Heart Rate (Exit)  65 bpm       Oxygen Saturation (Admit)  99 %       Oxygen Saturation (Exercise)  100 %       Rating of Perceived Exertion (Exercise)  11       Symptoms  none       Comments  walk test results          Exercise Comments:   Exercise Goals and Review: Exercise Goals    Row Name 08/15/18 1254             Exercise Goals   Increase Physical Activity  Yes       Intervention  Provide advice, education, support and counseling about physical activity/exercise needs.;Develop an individualized exercise prescription for aerobic and resistive  training based on initial evaluation findings, risk stratification, comorbidities and participant's personal goals.       Expected Outcomes  Short Term: Attend rehab on a regular basis to increase amount of physical activity.;Long Term: Add in home exercise to make exercise part of routine and to increase amount of physical activity.;Long Term: Exercising  regularly at least 3-5 days a week.       Increase Strength and Stamina  Yes       Intervention  Develop an individualized exercise prescription for aerobic and resistive training based on initial evaluation findings, risk stratification, comorbidities and participant's personal goals.;Provide advice, education, support and counseling about physical activity/exercise needs.       Expected Outcomes  Short Term: Increase workloads from initial exercise prescription for resistance, speed, and METs.;Short Term: Perform resistance training exercises routinely during rehab and add in resistance training at home;Long Term: Improve cardiorespiratory fitness, muscular endurance and strength as measured by increased METs and functional capacity (6MWT)       Able to understand and use rate of perceived exertion (RPE) scale  Yes       Intervention  Provide education and explanation on how to use RPE scale       Expected Outcomes  Short Term: Able to use RPE daily in rehab to express subjective intensity level;Long Term:  Able to use RPE to guide intensity level when exercising independently       Knowledge and understanding of Target Heart Rate Range (THRR)  Yes       Intervention  Provide education and explanation of THRR including how the numbers were predicted and where they are located for reference       Expected Outcomes  Short Term: Able to state/look up THRR;Short Term: Able to use daily as guideline for intensity in rehab;Long Term: Able to use THRR to govern intensity when exercising independently       Able to check pulse independently  Yes       Intervention  Provide education and demonstration on how to check pulse in carotid and radial arteries.;Review the importance of being able to check your own pulse for safety during independent exercise       Expected Outcomes  Short Term: Able to explain why pulse checking is important during independent exercise;Long Term: Able to check pulse independently and  accurately       Understanding of Exercise Prescription  Yes       Intervention  Provide education, explanation, and written materials on patient's individual exercise prescription       Expected Outcomes  Short Term: Able to explain program exercise prescription;Long Term: Able to explain home exercise prescription to exercise independently          Exercise Goals Re-Evaluation :   Discharge Exercise Prescription (Final Exercise Prescription Changes): Exercise Prescription Changes - 08/15/18 1200      Response to Exercise   Blood Pressure (Admit)  134/60    Blood Pressure (Exercise)  138/62    Blood Pressure (Exit)  122/62    Heart Rate (Admit)  61 bpm    Heart Rate (Exercise)  100 bpm    Heart Rate (Exit)  65 bpm    Oxygen Saturation (Admit)  99 %    Oxygen Saturation (Exercise)  100 %    Rating of Perceived Exertion (Exercise)  11    Symptoms  none    Comments  walk test results       Nutrition:  Target  Goals: Understanding of nutrition guidelines, daily intake of sodium <155m, cholesterol <2055m calories 30% from fat and 7% or less from saturated fats, daily to have 5 or more servings of fruits and vegetables.  Biometrics: Pre Biometrics - 08/15/18 1254      Pre Biometrics   Height  5' 8.9" (1.75 m)    Weight  140 lb 9.6 oz (63.8 kg)    Waist Circumference  32.5 inches    Hip Circumference  35.5 inches    Waist to Hip Ratio  0.92 %    BMI (Calculated)  20.82    Single Leg Stand  22.56 seconds        Nutrition Therapy Plan and Nutrition Goals: Nutrition Therapy & Goals - 08/15/18 1249      Intervention Plan   Intervention  Prescribe, educate and counsel regarding individualized specific dietary modifications aiming towards targeted core components such as weight, hypertension, lipid management, diabetes, heart failure and other comorbidities.;Nutrition handout(s) given to patient.    Expected Outcomes  Short Term Goal: Understand basic principles of dietary  content, such as calories, fat, sodium, cholesterol and nutrients.;Short Term Goal: A plan has been developed with personal nutrition goals set during dietitian appointment.;Long Term Goal: Adherence to prescribed nutrition plan.       Nutrition Assessments: Nutrition Assessments - 08/15/18 1249      MEDFICTS Scores   Pre Score  65       Nutrition Goals Re-Evaluation:   Nutrition Goals Discharge (Final Nutrition Goals Re-Evaluation):   Psychosocial: Target Goals: Acknowledge presence or absence of significant depression and/or stress, maximize coping skills, provide positive support system. Participant is able to verbalize types and ability to use techniques and skills needed for reducing stress and depression.   Initial Review & Psychosocial Screening: Initial Psych Review & Screening - 08/15/18 1242      Initial Review   Current issues with  History of Depression;Current Anxiety/Panic;Current Sleep Concerns;Current Stress Concerns    Source of Stress Concerns  Poor Coping Skills;Retirement/disability    Comments  [place] disability related to mental health. He reports anxiety, manic depression, history of panic attacks. He stopped taking all medications related to these issues in 202005/03/04nce both his parents died. He says he knows how to cope when he feels an "episode" coming on. usually he just leaves the situation (left AMA from hospital after stent because he wanted a cig). He dropped out of high school at 16 and taught himself how to read and write after he dropped out of school. He doesn't do much during the day due to back pain (which he does not take anything for).       Family Dynamics   Good Support System?  Yes   wife     Barriers   Psychosocial barriers to participate in program  The patient should benefit from training in stress management and relaxation.      Screening Interventions   Interventions  Encouraged to exercise;Program counselor consult;To provide  support and resources with identified psychosocial needs;Provide feedback about the scores to participant    Expected Outcomes  Short Term goal: Utilizing psychosocial counselor, staff and physician to assist with identification of specific Stressors or current issues interfering with healing process. Setting desired goal for each stressor or current issue identified.;Long Term Goal: Stressors or current issues are controlled or eliminated.;Short Term goal: Identification and review with participant of any Quality of Life or Depression concerns found by scoring the questionnaire.;Long Term  goal: The participant improves quality of Life and PHQ9 Scores as seen by post scores and/or verbalization of changes       Quality of Life Scores:  Quality of Life - 08/15/18 1246      Quality of Life   Select  Quality of Life      Quality of Life Scores   Health/Function Pre  21.47 %    Socioeconomic Pre  21.64 %    Psych/Spiritual Pre  20.5 %    Family Pre  25.2 %    GLOBAL Pre  21.89 %      Scores of 19 and below usually indicate a poorer quality of life in these areas.  A difference of  2-3 points is a clinically meaningful difference.  A difference of 2-3 points in the total score of the Quality of Life Index has been associated with significant improvement in overall quality of life, self-image, physical symptoms, and general health in studies assessing change in quality of life.  PHQ-9: Recent Review Flowsheet Data    Depression screen [facility] 2/9 08/15/2018   Decreased Interest 0   Down, Depressed, Hopeless 1   PHQ - 2 Score 1   Altered sleeping 2   Tired, decreased energy 1   Change in appetite 0   Feeling bad or failure about yourself  1   Trouble concentrating 0   Moving slowly or fidgety/restless 0   Suicidal thoughts 0   PHQ-9 Score 5   Difficult doing work/chores Not difficult at all     Interpretation of Total Score  Total Score Depression Severity:  1-4 = Minimal depression, 5-9  = Mild depression, 10-14 = Moderate depression, 15-19 = Moderately severe depression, 20-27 = Severe depression   Psychosocial Evaluation and Intervention:   Psychosocial Re-Evaluation:   Psychosocial Discharge (Final Psychosocial Re-Evaluation):   Vocational Rehabilitation: Provide vocational rehab assistance to qualifying candidates.   Vocational Rehab Evaluation & Intervention: Vocational Rehab - 08/15/18 1242      Initial Vocational Rehab Evaluation & Intervention   Assessment shows need for Vocational Rehabilitation  No       Education: Education Goals: Education classes will be provided on a variety of topics geared toward better understanding of heart health and risk factor modification. Participant will state understanding/return demonstration of topics presented as noted by education test scores.  Learning Barriers/Preferences: Learning Barriers/Preferences - 08/15/18 1242      Learning Barriers/Preferences   Learning Barriers  Reading    Learning Preferences  None       Education Topics:  AED/CPR: - Group verbal and written instruction with the use of models to demonstrate the basic use of the AED with the basic ABC's of resuscitation.   General Nutrition Guidelines/Fats and Fiber: -Group instruction provided by verbal, written material, models and posters to present the general guidelines for heart healthy nutrition. Gives an explanation and review of dietary fats and fiber.   Controlling Sodium/Reading Food Labels: -Group verbal and written material supporting the discussion of sodium use in heart healthy nutrition. Review and explanation with models, verbal and written materials for utilization of the food label.   Exercise Physiology & General Exercise Guidelines: - Group verbal and written instruction with models to review the exercise physiology of the cardiovascular system and associated critical values. Provides general exercise guidelines with  specific guidelines to those with heart or lung disease.    Aerobic Exercise & Resistance Training: - Gives group verbal and written instruction on the various  components of exercise. Focuses on aerobic and resistive training programs and the benefits of this training and how to safely progress through these programs..   Flexibility, Balance, Mind/Body Relaxation: Provides group verbal/written instruction on the benefits of flexibility and balance training, including mind/body exercise modes such as yoga, pilates and tai chi.  Demonstration and skill practice provided.   Stress and Anxiety: - Provides group verbal and written instruction about the health risks of elevated stress and causes of high stress.  Discuss the correlation between heart/lung disease and anxiety and treatment options. Review healthy ways to manage with stress and anxiety.   Depression: - Provides group verbal and written instruction on the correlation between heart/lung disease and depressed mood, treatment options, and the stigmas associated with seeking treatment.   Anatomy & Physiology of the Heart: - Group verbal and written instruction and models provide basic cardiac anatomy and physiology, with the coronary electrical and arterial systems. Review of Valvular disease and Heart Failure   Cardiac Procedures: - Group verbal and written instruction to review commonly prescribed medications for heart disease. Reviews the medication, class of the drug, and side effects. Includes the steps to properly store meds and maintain the prescription regimen. (beta blockers and nitrates)   Cardiac Medications I: - Group verbal and written instruction to review commonly prescribed medications for heart disease. Reviews the medication, class of the drug, and side effects. Includes the steps to properly store meds and maintain the prescription regimen.   Cardiac Medications II: -Group verbal and written instruction to review  commonly prescribed medications for heart disease. Reviews the medication, class of the drug, and side effects. (all other drug classes)    Go Sex-Intimacy & Heart Disease, Get SMART - Goal Setting: - Group verbal and written instruction through game format to discuss heart disease and the return to sexual intimacy. Provides group verbal and written material to discuss and apply goal setting through the application of the S.M.A.R.T. Method.   Other Matters of the Heart: - Provides group verbal, written materials and models to describe Stable Angina and Peripheral Artery. Includes description of the disease process and treatment options available to the cardiac patient.   Exercise & Equipment Safety: - Individual verbal instruction and demonstration of equipment use and safety with use of the equipment.   Cardiac Rehab from 08/15/2018 in [facility] Cardiac and Pulmonary Rehab  Date  08/15/18  Educator  [facility]  Instruction Review Code  1- Verbalizes Understanding      Infection Prevention: - Provides verbal and written material to individual with discussion of infection control including proper hand washing and proper equipment cleaning during exercise session.   Cardiac Rehab from 08/15/2018 in [facility] Cardiac and Pulmonary Rehab  Date  08/15/18  Educator  [facility]  Instruction Review Code  1- Verbalizes Understanding      Falls Prevention: - Provides verbal and written material to individual with discussion of falls prevention and safety.   Cardiac Rehab from 08/15/2018 in [facility] Cardiac and Pulmonary Rehab  Date  08/15/18  Educator  [facility]  Instruction Review Code  1- Verbalizes Understanding      Diabetes: - Individual verbal and written instruction to review signs/symptoms of diabetes, desired ranges of glucose level fasting, after meals and with exercise. Acknowledge that pre and post exercise glucose checks will be done for 3 sessions at entry of program.   Know Your Numbers and Risk  Factors: -Group verbal and written instruction about important numbers in your health.  Discussion  of what are risk factors and how they play a role in the disease process.  Review of Cholesterol, Blood Pressure, Diabetes, and BMI and the role they play in your overall health.   Sleep Hygiene: -Provides group verbal and written instruction about how sleep can affect your health.  Define sleep hygiene, discuss sleep cycles and impact of sleep habits. Review good sleep hygiene tips.    Other: -Provides group and verbal instruction on various topics (see comments)   Knowledge Questionnaire Score: Knowledge Questionnaire Score - 08/15/18 1242      Knowledge Questionnaire Score   Pre Score  16/26   focus on risk factors, MI, Nutrition and Exercise      Core Components/Risk Factors/Patient Goals at Admission: Personal Goals and Risk Factors at Admission - 08/15/18 1241      Core Components/Risk Factors/Patient Goals on Admission    Weight Management  Yes;Weight Maintenance    Intervention  Weight Management: Develop a combined nutrition and exercise program designed to reach desired caloric intake, while maintaining appropriate intake of nutrient and fiber, sodium and fats, and appropriate energy expenditure required for the weight goal.;Weight Management: Provide education and appropriate resources to help participant work on and attain dietary goals.    Admit Weight  140 lb 9.6 oz (63.8 kg)    Expected Outcomes  Short Term: Continue to assess and modify interventions until short term weight is achieved;Long Term: Adherence to nutrition and physical activity/exercise program aimed toward attainment of established weight goal;Weight Maintenance: Understanding of the daily nutrition guidelines, which includes 25-35% calories from fat, 7% or less cal from saturated fats, less than 251m cholesterol, less than 1.5gm of sodium, & 5 or more servings of fruits and vegetables daily;Understanding  recommendations for meals to include 15-35% energy as protein, 25-35% energy from fat, 35-60% energy from carbohydrates, less than 2061mof dietary cholesterol, 20-35 gm of total fiber daily;Understanding of distribution of calorie intake throughout the day with the consumption of 4-5 meals/snacks    Tobacco Cessation  Yes    Number of packs per day  1.5   patient stated he has smoked for the past 45 years and does not plan to quit   Intervention  Assist the participant in steps to quit. Provide individualized education and counseling about committing to Tobacco Cessation, relapse prevention, and pharmacological support that can be provided by physician.;OfAdvice workerassist with locating and accessing local/national Quit Smoking programs, and support quit date choice.    Expected Outcomes  Short Term: Will demonstrate readiness to quit, by selecting a quit date.;Short Term: Will quit all tobacco product use, adhering to prevention of relapse plan.;Long Term: Complete abstinence from all tobacco products for at least 12 months from quit date.    Hypertension  Yes    Intervention  Monitor prescription use compliance.;Provide education on lifestyle modifcations including regular physical activity/exercise, weight management, moderate sodium restriction and increased consumption of fresh fruit, vegetables, and low fat dairy, alcohol moderation, and smoking cessation.    Expected Outcomes  Short Term: Continued assessment and intervention until BP is < 140/909mG in hypertensive participants. < 130/77m85m in hypertensive participants with diabetes, heart failure or chronic kidney disease.;Long Term: Maintenance of blood pressure at goal levels.       Core Components/Risk Factors/Patient Goals Review:    Core Components/Risk Factors/Patient Goals at Discharge (Final Review):    ITP Comments: ITP Comments    Row Name 08/15/18 1237  ITP Comments  Med Review completed.  Initial ITP created. Diagnosis can be found in [facility] 11/17          Comments: Initial ITP

## 2018-08-19 DIAGNOSIS — I214 Non-ST elevation (NSTEMI) myocardial infarction: Secondary | ICD-10-CM | POA: Diagnosis not present

## 2018-08-19 DIAGNOSIS — Z955 Presence of coronary angioplasty implant and graft: Secondary | ICD-10-CM

## 2018-08-19 NOTE — Progress Notes (Signed)
Daily Session Note  Patient Details  Name: Thomas Romero MRN: [id number] Date of Birth: February 06, 1966 Referring Provider:     Cardiac Rehab from 08/15/2018 in [facility] Cardiac and Pulmonary Rehab  Referring Provider  Kathlyn Sacramento MD      Encounter Date: 08/19/2018  Check In: Session Check In - 08/19/18 1020      Check-In   Supervising physician immediately available to respond to emergencies  See telemetry face sheet for immediately available ER MD    Location  [facility]-Cardiac & Pulmonary Rehab    Staff Present  Renita Papa, RN Vickki Hearing, BA, ACSM CEP, Exercise Physiologist;Tehya Leath Luan Pulling, MA, RCEP, CCRP, Exercise Physiologist    Medication changes reported      No    Fall or balance concerns reported     No    Warm-up and Cool-down  Performed as group-led instruction    Resistance Training Performed  Yes    VAD Patient?  No    PAD/SET Patient?  No      Pain Assessment   Currently in Pain?  No/denies          Social History   Tobacco Use  Smoking Status Current Every Day Smoker  . Packs/day: 1.50  . Years: 36.00  . Pack years: 54.00  . Types: Cigarettes  Smokeless Tobacco Never Used  Tobacco Comment   Patient 5 PPD; recently cut back to 1.5 daily    Goals Met:  Exercise tolerated well Personal goals reviewed No report of cardiac concerns or symptoms Strength training completed today  Goals Unmet:  Not Applicable  Comments: First full day of exercise!  Patient was oriented to gym and equipment including functions, settings, policies, and procedures.  Patient's individual exercise prescription and treatment plan were reviewed.  All starting workloads were established based on the results of the 6 minute walk test done at initial orientation visit.  The plan for exercise progression was also introduced and progression will be customized based on patient's performance and goals.    Dr. Emily Filbert is Medical Director for [place]  and [name].

## 2018-08-22 ENCOUNTER — Encounter: Payer: Medicare HMO | Admitting: *Deleted

## 2018-08-22 DIAGNOSIS — Z955 Presence of coronary angioplasty implant and graft: Secondary | ICD-10-CM

## 2018-08-22 DIAGNOSIS — I214 Non-ST elevation (NSTEMI) myocardial infarction: Secondary | ICD-10-CM

## 2018-08-22 NOTE — Progress Notes (Signed)
Daily Session Note  Patient Details  Name: Thomas Romero MRN: [id number] Date of Birth: 01-31-1966 Referring Provider:     Cardiac Rehab from 08/15/2018 in [facility] Cardiac and Pulmonary Rehab  Referring Provider  Kathlyn Sacramento MD      Encounter Date: 08/22/2018  Check In: Session Check In - 08/22/18 0746      Check-In   Supervising physician immediately available to respond to emergencies  See telemetry face sheet for immediately available ER MD    Location  [facility]-Cardiac & Pulmonary Rehab    Staff Present  Earlean Shawl, BS, ACSM CEP, Exercise Physiologist;Carroll Enterkin, RN, Levie Heritage, MA, RCEP, CCRP, Exercise Physiologist    Medication changes reported      No    Fall or balance concerns reported     No    Tobacco Cessation  No Change    Warm-up and Cool-down  Performed as group-led instruction    Resistance Training Performed  Yes    VAD Patient?  No    PAD/SET Patient?  No      Pain Assessment   Currently in Pain?  No/denies    Multiple Pain Sites  No          Social History   Tobacco Use  Smoking Status Current Every Day Smoker  . Packs/day: 1.50  . Years: 36.00  . Pack years: 54.00  . Types: Cigarettes  Smokeless Tobacco Never Used  Tobacco Comment   Patient 5 PPD; recently cut back to 1.5 daily    Goals Met:  Independence with exercise equipment Exercise tolerated well No report of cardiac concerns or symptoms Strength training completed today  Goals Unmet:  Not Applicable  Comments: Pt able to follow exercise prescription today without complaint.  Will continue to monitor for progression.    Dr. Emily Filbert is Medical Director for [place] and [name].

## 2018-08-24 DIAGNOSIS — I214 Non-ST elevation (NSTEMI) myocardial infarction: Secondary | ICD-10-CM

## 2018-08-24 NOTE — Progress Notes (Signed)
Daily Session Note  Patient Details  Name: Thomas Romero MRN: [id number] Date of Birth: 20-Dec-1965 Referring Provider:     Cardiac Rehab from 08/15/2018 in [facility] Cardiac and Pulmonary Rehab  Referring Provider  Kathlyn Sacramento MD      Encounter Date: 08/24/2018  Check In: Session Check In - 08/24/18 0741      Check-In   Supervising physician immediately available to respond to emergencies  See telemetry face sheet for immediately available ER MD    Location  [facility]-Cardiac & Pulmonary Rehab    Staff Present  Justin Mend RCP,RRT,BSRT;Jessica Luan Pulling, MA, RCEP, CCRP, Exercise Physiologist;Susanne Bice, RN, BSN, CCRP    Medication changes reported      No    Fall or balance concerns reported     No    Tobacco Cessation  No Change    Warm-up and Cool-down  Performed as group-led instruction    Resistance Training Performed  Yes    VAD Patient?  No    PAD/SET Patient?  No      Pain Assessment   Currently in Pain?  No/denies          Social History   Tobacco Use  Smoking Status Current Every Day Smoker  . Packs/day: 1.50  . Years: 36.00  . Pack years: 54.00  . Types: Cigarettes  Smokeless Tobacco Never Used  Tobacco Comment   Patient 5 PPD; recently cut back to 1.5 daily    Goals Met:  Independence with exercise equipment Exercise tolerated well No report of cardiac concerns or symptoms Strength training completed today  Goals Unmet:  Not Applicable  Comments: Reviewed home exercise with pt today.  Pt plans to walk at home an extra two days a week for exercise.  Reviewed THR, pulse, RPE, sign and symptoms, NTG use, and when to call 911 or MD.  Also discussed weather considerations and indoor options.  Pt voiced understanding. Pt able to follow exercise prescription today without complaint.  Will continue to monitor for progression.    Dr. Emily Filbert is Medical Director for [place] and [name].

## 2018-08-25 ENCOUNTER — Ambulatory Visit (INDEPENDENT_AMBULATORY_CARE_PROVIDER_SITE_OTHER): Payer: Medicare HMO

## 2018-08-25 ENCOUNTER — Other Ambulatory Visit: Payer: Self-pay

## 2018-08-25 DIAGNOSIS — I214 Non-ST elevation (NSTEMI) myocardial infarction: Secondary | ICD-10-CM | POA: Diagnosis not present

## 2018-08-26 DIAGNOSIS — I214 Non-ST elevation (NSTEMI) myocardial infarction: Secondary | ICD-10-CM

## 2018-08-26 DIAGNOSIS — Z955 Presence of coronary angioplasty implant and graft: Secondary | ICD-10-CM

## 2018-08-26 NOTE — Progress Notes (Signed)
Daily Session Note  Patient Details  Name: Thomas Romero MRN: [id number] Date of Birth: 04-08-1966 Referring Provider:     Cardiac Rehab from 08/15/2018 in [facility] Cardiac and Pulmonary Rehab  Referring Provider  Kathlyn Sacramento MD      Encounter Date: 08/26/2018  Check In:      Social History   Tobacco Use  Smoking Status Current Every Day Smoker  . Packs/day: 1.50  . Years: 36.00  . Pack years: 54.00  . Types: Cigarettes  Smokeless Tobacco Never Used  Tobacco Comment   Patient 5 PPD; recently cut back to 1.5 daily    Goals Met:  Independence with exercise equipment Exercise tolerated well No report of cardiac concerns or symptoms Strength training completed today  Goals Unmet:  Not Applicable  Comments: Pt able to follow exercise prescription today without complaint.  Will continue to monitor for progression.    Dr. Emily Filbert is Medical Director for [place] and [name].

## 2018-08-29 ENCOUNTER — Encounter: Payer: Medicare HMO | Admitting: *Deleted

## 2018-08-29 DIAGNOSIS — I214 Non-ST elevation (NSTEMI) myocardial infarction: Secondary | ICD-10-CM | POA: Diagnosis not present

## 2018-08-29 DIAGNOSIS — Z955 Presence of coronary angioplasty implant and graft: Secondary | ICD-10-CM

## 2018-08-29 NOTE — Progress Notes (Signed)
Daily Session Note  Patient Details  Name: WRIGLEY PLASENCIA MRN: [id number] Date of Birth: 05-09-1966 Referring Provider:     Cardiac Rehab from 08/15/2018 in [facility] Cardiac and Pulmonary Rehab  Referring Provider  Kathlyn Sacramento MD      Encounter Date: 08/29/2018  Check In: Session Check In - 08/29/18 0807      Check-In   Supervising physician immediately available to respond to emergencies  See telemetry face sheet for immediately available ER MD    Location  [facility]-Cardiac & Pulmonary Rehab    Staff Present  Earlean Shawl, BS, ACSM CEP, Exercise Physiologist;Jessica Luan Pulling, MA, RCEP, CCRP, Exercise Physiologist;Susanne Bice, RN, BSN, CCRP    Medication changes reported      No    Fall or balance concerns reported     No    Tobacco Cessation  No Change    Warm-up and Cool-down  Performed as group-led instruction    Resistance Training Performed  Yes    VAD Patient?  No    PAD/SET Patient?  No      Pain Assessment   Currently in Pain?  No/denies    Multiple Pain Sites  No          Social History   Tobacco Use  Smoking Status Current Every Day Smoker  . Packs/day: 1.50  . Years: 36.00  . Pack years: 54.00  . Types: Cigarettes  Smokeless Tobacco Never Used  Tobacco Comment   Patient 5 PPD; recently cut back to 1.5 daily    Goals Met:  Independence with exercise equipment Exercise tolerated well No report of cardiac concerns or symptoms Strength training completed today  Goals Unmet:  Not Applicable  Comments: Pt able to follow exercise prescription today without complaint.  Will continue to monitor for progression.    Dr. Emily Filbert is Medical Director for [place] and [name].

## 2018-09-02 ENCOUNTER — Encounter: Payer: Medicare HMO | Admitting: *Deleted

## 2018-09-02 DIAGNOSIS — I214 Non-ST elevation (NSTEMI) myocardial infarction: Secondary | ICD-10-CM | POA: Diagnosis not present

## 2018-09-02 DIAGNOSIS — Z955 Presence of coronary angioplasty implant and graft: Secondary | ICD-10-CM

## 2018-09-02 NOTE — Progress Notes (Signed)
Daily Session Note  Patient Details  Name: Thomas Romero MRN: [id number] Date of Birth: 14-Mar-1966 Referring Provider:     Cardiac Rehab from 08/15/2018 in [facility] Cardiac and Pulmonary Rehab  Referring Provider  Kathlyn Sacramento MD      Encounter Date: 09/02/2018  Check In: Session Check In - 09/02/18 0804      Check-In   Supervising physician immediately available to respond to emergencies  See telemetry face sheet for immediately available ER MD    Location  [facility]-Cardiac & Pulmonary Rehab    Staff Present  Alberteen Sam, MA, RCEP, CCRP, Exercise Physiologist;Amanda Oletta Darter, BA, ACSM CEP, Exercise Physiologist;Meredith Sherryll Burger, RN BSN    Medication changes reported      No    Fall or balance concerns reported     No    Warm-up and Cool-down  Performed as group-led Higher education careers adviser Performed  Yes    VAD Patient?  No    PAD/SET Patient?  No      Pain Assessment   Currently in Pain?  No/denies          Social History   Tobacco Use  Smoking Status Current Every Day Smoker  . Packs/day: 1.50  . Years: 36.00  . Pack years: 54.00  . Types: Cigarettes  Smokeless Tobacco Never Used  Tobacco Comment   Patient 5 PPD; recently cut back to 1.5 daily    Goals Met:  Independence with exercise equipment Exercise tolerated well No report of cardiac concerns or symptoms Strength training completed today  Goals Unmet:  Not Applicable  Comments: Pt able to follow exercise prescription today without complaint.  Will continue to monitor for progression.    Dr. Emily Filbert is Medical Director for [place] and [name].

## 2018-09-05 DIAGNOSIS — I214 Non-ST elevation (NSTEMI) myocardial infarction: Secondary | ICD-10-CM

## 2018-09-05 DIAGNOSIS — Z955 Presence of coronary angioplasty implant and graft: Secondary | ICD-10-CM

## 2018-09-05 NOTE — Progress Notes (Signed)
Daily Session Note  Patient Details  Name: Thomas Romero MRN: [id number] Date of Birth: 12-17-1965 Referring Provider:     Cardiac Rehab from 08/15/2018 in [facility] Cardiac and Pulmonary Rehab  Referring Provider  Kathlyn Sacramento MD      Encounter Date: 09/05/2018  Check In: Session Check In - 09/05/18 0820      Check-In   Supervising physician immediately available to respond to emergencies  See telemetry face sheet for immediately available ER MD    Location  [facility]-Cardiac & Pulmonary Rehab    Staff Present  Gerlene Burdock, RN, BSN;Jeanna Durrell BS, Exercise Physiologist;Hagar Sadiq Oletta Darter, BA, ACSM CEP, Exercise Physiologist    Medication changes reported      No    Fall or balance concerns reported     No    Warm-up and Cool-down  Performed as group-led instruction    Resistance Training Performed  Yes    VAD Patient?  No    PAD/SET Patient?  No      Pain Assessment   Currently in Pain?  No/denies    Multiple Pain Sites  No          Social History   Tobacco Use  Smoking Status Current Every Day Smoker  . Packs/day: 1.50  . Years: 36.00  . Pack years: 54.00  . Types: Cigarettes  Smokeless Tobacco Never Used  Tobacco Comment   Patient 5 PPD; recently cut back to 1.5 daily    Goals Met:  Independence with exercise equipment Exercise tolerated well No report of cardiac concerns or symptoms Strength training completed today  Goals Unmet:  Not Applicable  Comments: Pt able to follow exercise prescription today without complaint.  Will continue to monitor for progression.    Dr. Emily Filbert is Medical Director for [place] and [name].

## 2018-09-06 ENCOUNTER — Encounter: Payer: Self-pay | Admitting: *Deleted

## 2018-09-06 DIAGNOSIS — Z955 Presence of coronary angioplasty implant and graft: Secondary | ICD-10-CM

## 2018-09-06 DIAGNOSIS — I214 Non-ST elevation (NSTEMI) myocardial infarction: Secondary | ICD-10-CM

## 2018-09-06 NOTE — Progress Notes (Signed)
Cardiac Individual Treatment Plan  Patient Details  Name: Thomas Romero MRN: [id number] Date of Birth: 1966/05/19 Referring Provider:     Cardiac Rehab from 08/15/2018 in [facility] Cardiac and Pulmonary Rehab  Referring Provider  Kathlyn Sacramento MD      Initial Encounter Date:    Cardiac Rehab from 08/15/2018 in [facility] Cardiac and Pulmonary Rehab  Date  08/15/18      Visit Diagnosis: NSTEMI (non-ST elevated myocardial infarction) [facility] [place])  Status post coronary artery stent placement  Patient's Home Medications on Admission:  Current Outpatient Medications:  .  aspirin 81 MG chewable tablet, Chew 1 tablet (81 mg total) by mouth daily., Disp: 90 tablet, Rfl: 0 .  atorvastatin (LIPITOR) 80 MG tablet, Take 1 tablet (80 mg total) by mouth daily at 6 PM., Disp: 30 tablet, Rfl: 6 .  nitroGLYCERIN (NITROSTAT) 0.4 MG SL tablet, Place 1 tablet (0.4 mg total) under the tongue every 5 (five) minutes as needed for chest pain., Disp: 25 tablet, Rfl: 1 .  pantoprazole (PROTONIX) 40 MG tablet, Take 1 tablet (40 mg total) by mouth daily., Disp: 30 tablet, Rfl: 6 .  ticagrelor (BRILINTA) 90 MG TABS tablet, Take 1 tablet (90 mg total) by mouth 2 (two) times daily., Disp: 60 tablet, Rfl: 6  Past Medical History: Past Medical History:  Diagnosis Date  . Coronary artery disease    Non-ST elevation myocardial infarction in November 2019.  Cardiac catheterization showed 99% stenosis in the mid RCA treated successfully with PCI and drug-eluting stent placement in moderate mid left circumflex disease.  . Gastric ulcer   . Hyperlipidemia   . Manic depression ([place])   . Panic attacks   . Tobacco use     Tobacco Use: Social History   Tobacco Use  Smoking Status Current Every Day Smoker  . Packs/day: 1.50  . Years: 36.00  . Pack years: 54.00  . Types: Cigarettes  Smokeless Tobacco Never Used  Tobacco Comment   Patient 5 PPD; recently cut back to 1.5 daily    Labs: Recent Review Flowsheet Data    Labs  for ITP Cardiac and Pulmonary Rehab Latest Ref Rng & Units 07/25/2018   Cholestrol 0 - 200 mg/dL 203(H)   LDLCALC 0 - 99 mg/dL 152(H)   HDL >40 mg/dL 24(L)   Trlycerides <150 mg/dL 134       Exercise Target Goals: Exercise Program Goal: Individual exercise prescription set using results from initial 6 min walk test and THRR while considering  patient's activity barriers and safety.   Exercise Prescription Goal: Initial exercise prescription builds to 30-45 minutes a day of aerobic activity, 2-3 days per week.  Home exercise guidelines will be given to patient during program as part of exercise prescription that the participant will acknowledge.  Activity Barriers & Risk Stratification: Activity Barriers & Cardiac Risk Stratification - 08/15/18 1240      Activity Barriers & Cardiac Risk Stratification   Activity Barriers  Arthritis;Back Problems;Joint Problems;Deconditioning;Muscular Weakness   left knee injury from high school, still bothers him   Cardiac Risk Stratification  High       6 Minute Walk: 6 Minute Walk    Row Name 08/15/18 1251         6 Minute Walk   Phase  Initial     Distance  1300 feet     Walk Time  6 minutes     # of Rest Breaks  0     MPH  2.46  METS  4.43     RPE  11     VO2 Peak  15.5     Symptoms  No     Resting HR  61 bpm     Resting BP  134/60     Resting Oxygen Saturation   99 %     Exercise Oxygen Saturation  during 6 min walk  100 %     Max Ex. HR  100 bpm     Max Ex. BP  138/62     2 Minute Post BP  122/62        Oxygen Initial Assessment:   Oxygen Re-Evaluation:   Oxygen Discharge (Final Oxygen Re-Evaluation):   Initial Exercise Prescription: Initial Exercise Prescription - 08/15/18 1200      Date of Initial Exercise RX and Referring Provider   Date  08/15/18    Referring Provider  Kathlyn Sacramento MD      Treadmill   MPH  2.5    Grade  1    Minutes  15    METs  3.26      Recumbant Bike   Level  4    RPM  50     Watts  49    Minutes  15    METs  3.2      T5 Nustep   Level  4    SPM  80    Minutes  15    METs  3      Prescription Details   Frequency (times per week)  3    Duration  Progress to 45 minutes of aerobic exercise without signs/symptoms of physical distress      Intensity   THRR 40-80% of Max Heartrate  104-147    Ratings of Perceived Exertion  11-13    Perceived Dyspnea  0-4      Progression   Progression  Continue to progress workloads to maintain intensity without signs/symptoms of physical distress.      Resistance Training   Training Prescription  Yes    Weight  4 lbs    Reps  10-15       Perform Capillary Blood Glucose checks as needed.  Exercise Prescription Changes: Exercise Prescription Changes    Row Name 08/15/18 1200 08/24/18 0900 08/29/18 1000         Response to Exercise   Blood Pressure (Admit)  134/60  -  128/72     Blood Pressure (Exercise)  138/62  -  128/74     Blood Pressure (Exit)  122/62  -  132/70     Heart Rate (Admit)  61 bpm  -  75 bpm     Heart Rate (Exercise)  100 bpm  -  140 bpm     Heart Rate (Exit)  65 bpm  -  82 bpm     Oxygen Saturation (Admit)  99 %  -  -     Oxygen Saturation (Exercise)  100 %  -  -     Rating of Perceived Exertion (Exercise)  11  -  12     Symptoms  none  -  none     Comments  walk test results  -  -     Duration  -  -  Continue with 45 min of aerobic exercise without signs/symptoms of physical distress.     Intensity  -  -  THRR unchanged       Progression   Progression  -  -  Continue to progress workloads to maintain intensity without signs/symptoms of physical distress.     Average METs  -  -  3.19       Resistance Training   Training Prescription  -  -  Yes     Weight  -  -  4 lbs     Reps  -  -  10-15       Interval Training   Interval Training  -  -  No       Treadmill   MPH  -  -  2.5     Grade  -  -  1     Minutes  -  -  15     METs  -  -  3.26       Recumbant Bike   Level  -  -  4      Minutes  -  -  15     METs  -  -  4.1       T5 Nustep   Level  -  -  4     Minutes  -  -  15     METs  -  -  2.2       Home Exercise Plan   Plans to continue exercise at  -  Home (comment) 2 days a week  Home (comment) 2 days a week     Frequency  -  Add 2 additional days to program exercise sessions.  Add 2 additional days to program exercise sessions.     Initial Home Exercises Provided  -  08/24/18  08/24/18        Exercise Comments: Exercise Comments    Row Name 08/19/18 1020           Exercise Comments  First full day of exercise!  Patient was oriented to gym and equipment including functions, settings, policies, and procedures.  Patient's individual exercise prescription and treatment plan were reviewed.  All starting workloads were established based on the results of the 6 minute walk test done at initial orientation visit.  The plan for exercise progression was also introduced and progression will be customized based on patient's performance and goals.          Exercise Goals and Review: Exercise Goals    Row Name 08/15/18 1254             Exercise Goals   Increase Physical Activity  Yes       Intervention  Provide advice, education, support and counseling about physical activity/exercise needs.;Develop an individualized exercise prescription for aerobic and resistive training based on initial evaluation findings, risk stratification, comorbidities and participant's personal goals.       Expected Outcomes  Short Term: Attend rehab on a regular basis to increase amount of physical activity.;Long Term: Add in home exercise to make exercise part of routine and to increase amount of physical activity.;Long Term: Exercising regularly at least 3-5 days a week.       Increase Strength and Stamina  Yes       Intervention  Develop an individualized exercise prescription for aerobic and resistive training based on initial evaluation findings, risk stratification,  comorbidities and participant's personal goals.;Provide advice, education, support and counseling about physical activity/exercise needs.       Expected Outcomes  Short Term: Increase workloads from initial exercise prescription for resistance, speed, and METs.;Short Term: Perform resistance training exercises routinely during rehab and add in  resistance training at home;Long Term: Improve cardiorespiratory fitness, muscular endurance and strength as measured by increased METs and functional capacity (6MWT)       Able to understand and use rate of perceived exertion (RPE) scale  Yes       Intervention  Provide education and explanation on how to use RPE scale       Expected Outcomes  Short Term: Able to use RPE daily in rehab to express subjective intensity level;Long Term:  Able to use RPE to guide intensity level when exercising independently       Knowledge and understanding of Target Heart Rate Range (THRR)  Yes       Intervention  Provide education and explanation of THRR including how the numbers were predicted and where they are located for reference       Expected Outcomes  Short Term: Able to state/look up THRR;Short Term: Able to use daily as guideline for intensity in rehab;Long Term: Able to use THRR to govern intensity when exercising independently       Able to check pulse independently  Yes       Intervention  Provide education and demonstration on how to check pulse in carotid and radial arteries.;Review the importance of being able to check your own pulse for safety during independent exercise       Expected Outcomes  Short Term: Able to explain why pulse checking is important during independent exercise;Long Term: Able to check pulse independently and accurately       Understanding of Exercise Prescription  Yes       Intervention  Provide education, explanation, and written materials on patient's individual exercise prescription       Expected Outcomes  Short Term: Able to explain  program exercise prescription;Long Term: Able to explain home exercise prescription to exercise independently          Exercise Goals Re-Evaluation : Exercise Goals Re-Evaluation    Row Name 08/24/18 0909 08/29/18 1021           Exercise Goal Re-Evaluation   Exercise Goals Review  Increase Physical Activity;Increase Strength and Stamina;Able to understand and use rate of perceived exertion (RPE) scale;Able to understand and use Dyspnea scale;Knowledge and understanding of Target Heart Rate Range (THRR);Able to check pulse independently;Understanding of Exercise Prescription  Increase Physical Activity;Increase Strength and Stamina;Understanding of Exercise Prescription      Comments  Reviewed home exercise with pt today.  Pt plans to walk at home an extra two days a week for exercise.  Reviewed THR, pulse, RPE, sign and symptoms, NTG use, and when to call 911 or MD.  Also discussed weather considerations and indoor options.  Pt voiced understanding.  Inri is off to a good start in rehab.  He is enjoying it more than he thought he would.  So far, he is tolerating the group atmosphere okay.  He is ready to start to increase his workloads.  We will continue to monitor his progress.       Expected Outcomes  Short: add two extra days of walking at home. Long: maintain home exercise independently  Short: Begin to increase workloads.  Long: Continue to exercise on his own at home.          Discharge Exercise Prescription (Final Exercise Prescription Changes): Exercise Prescription Changes - 08/29/18 1000      Response to Exercise   Blood Pressure (Admit)  128/72    Blood Pressure (Exercise)  128/74    Blood  Pressure (Exit)  132/70    Heart Rate (Admit)  75 bpm    Heart Rate (Exercise)  140 bpm    Heart Rate (Exit)  82 bpm    Rating of Perceived Exertion (Exercise)  12    Symptoms  none    Duration  Continue with 45 min of aerobic exercise without signs/symptoms of physical distress.     Intensity  THRR unchanged      Progression   Progression  Continue to progress workloads to maintain intensity without signs/symptoms of physical distress.    Average METs  3.19      Resistance Training   Training Prescription  Yes    Weight  4 lbs    Reps  10-15      Interval Training   Interval Training  No      Treadmill   MPH  2.5    Grade  1    Minutes  15    METs  3.26      Recumbant Bike   Level  4    Minutes  15    METs  4.1      T5 Nustep   Level  4    Minutes  15    METs  2.2      Home Exercise Plan   Plans to continue exercise at  Home (comment)   2 days a week   Frequency  Add 2 additional days to program exercise sessions.    Initial Home Exercises Provided  08/24/18       Nutrition:  Target Goals: Understanding of nutrition guidelines, daily intake of sodium <1557m, cholesterol <2084m calories 30% from fat and 7% or less from saturated fats, daily to have 5 or more servings of fruits and vegetables.  Biometrics: Pre Biometrics - 08/15/18 1254      Pre Biometrics   Height  5' 8.9" (1.75 m)    Weight  140 lb 9.6 oz (63.8 kg)    Waist Circumference  32.5 inches    Hip Circumference  35.5 inches    Waist to Hip Ratio  0.92 %    BMI (Calculated)  20.82    Single Leg Stand  22.56 seconds        Nutrition Therapy Plan and Nutrition Goals: Nutrition Therapy & Goals - 08/22/18 0926      Nutrition Therapy   Diet  TLC    Drug/Food Interactions  Statins/Certain Fruits    Protein (specify units)  8-9oz    Fiber  30 grams    Whole Grain Foods  3 servings   does not eat grains regularly but will occasionally choose whole grain bread for sandwhiches   Saturated Fats  15 max. grams    Fruits and Vegetables  4 servings/day   does not eat fruits or vegetables regularly   Sodium  1500 grams      Personal Nutrition Goals   Nutrition Goal  Eat a meal or snack during the first part of the day at least one day per week consistently.    Personal Goal #2   Continue to work on drinking water in place of [place] and Sweet tea    Personal Goal #3  Eat one additional serving of fruit or vegetables each week    Comments  He typically eats 1 meal / day, sometimes 2, and does not typcially eat fruit d/t disliking it. Eats green beans and corn for vegetables and does not usually eat grains. He  drinks very little water; drinks mostly [place] and sweet tea. He has trouble gaining wt per his report. Eats mostly meat which he bakes in the oven. Uses salt "depending on how he's preparing the meat."      Intervention Plan   Intervention  Prescribe, educate and counsel regarding individualized specific dietary modifications aiming towards targeted core components such as weight, hypertension, lipid management, diabetes, heart failure and other comorbidities.    Expected Outcomes  Short Term Goal: Understand basic principles of dietary content, such as calories, fat, sodium, cholesterol and nutrients.;Short Term Goal: A plan has been developed with personal nutrition goals set during dietitian appointment.;Long Term Goal: Adherence to prescribed nutrition plan.       Nutrition Assessments: Nutrition Assessments - 08/15/18 1249      MEDFICTS Scores   Pre Score  65       Nutrition Goals Re-Evaluation: Nutrition Goals Re-Evaluation    [place] Name 08/22/18 0947             Goals   Nutrition Goal  Eat a meal or snack during the first part of the day at least one day per week consistently.       Comment  He usually only eats dinner but states he has trouble gaining weight. He has never been a breakfast eater and is not willing to start       Expected Outcome  He will eat a small meal or snack at lunch time at least one day/week. Long term goal: eat 2 meals/day consistently         Personal Goal #2 Re-Evaluation   Personal Goal #2  Continue to work on drinking water in place of [place]. Dew and Sweet tea         Personal Goal #3 Re-Evaluation   Personal Goal #3   Eat one additional serving of fruit or vegetables each week          Nutrition Goals Discharge (Final Nutrition Goals Re-Evaluation): Nutrition Goals Re-Evaluation - 08/22/18 0947      Goals   Nutrition Goal  Eat a meal or snack during the first part of the day at least one day per week consistently.    Comment  He usually only eats dinner but states he has trouble gaining weight. He has never been a breakfast eater and is not willing to start    Expected Outcome  He will eat a small meal or snack at lunch time at least one day/week. Long term goal: eat 2 meals/day consistently      Personal Goal #2 Re-Evaluation   Personal Goal #2  Continue to work on drinking water in place of [place]. Dew and Sweet tea      Personal Goal #3 Re-Evaluation   Personal Goal #3  Eat one additional serving of fruit or vegetables each week       Psychosocial: Target Goals: Acknowledge presence or absence of significant depression and/or stress, maximize coping skills, provide positive support system. Participant is able to verbalize types and ability to use techniques and skills needed for reducing stress and depression.   Initial Review & Psychosocial Screening: Initial Psych Review & Screening - 08/15/18 1242      Initial Review   Current issues with  History of Depression;Current Anxiety/Panic;Current Sleep Concerns;Current Stress Concerns    Source of Stress Concerns  Poor Coping Skills;Retirement/disability    Comments  Kweku is on disability related to mental health. He reports anxiety, manic depression, history  of panic attacks. He stopped taking all medications related to these issues in 11-18-03 once both his parents died. He says he knows how to cope when he feels an "episode" coming on. usually he just leaves the situation (left AMA from hospital after stent because he wanted a cig). He dropped out of high school at 16 and taught himself how to read and write after he dropped out of school. He doesn't do  much during the day due to back pain (which he does not take anything for).       Family Dynamics   Good Support System?  Yes   wife     Barriers   Psychosocial barriers to participate in program  The patient should benefit from training in stress management and relaxation.      Screening Interventions   Interventions  Encouraged to exercise;Program counselor consult;To provide support and resources with identified psychosocial needs;Provide feedback about the scores to participant    Expected Outcomes  Short Term goal: Utilizing psychosocial counselor, staff and physician to assist with identification of specific Stressors or current issues interfering with healing process. Setting desired goal for each stressor or current issue identified.;Long Term Goal: Stressors or current issues are controlled or eliminated.;Short Term goal: Identification and review with participant of any Quality of Life or Depression concerns found by scoring the questionnaire.;Long Term goal: The participant improves quality of Life and PHQ9 Scores as seen by post scores and/or verbalization of changes       Quality of Life Scores:  Quality of Life - 08/15/18 1246      Quality of Life   Select  Quality of Life      Quality of Life Scores   Health/Function Pre  21.47 %    Socioeconomic Pre  21.64 %    Psych/Spiritual Pre  20.5 %    Family Pre  25.2 %    GLOBAL Pre  21.89 %      Scores of 19 and below usually indicate a poorer quality of life in these areas.  A difference of  2-3 points is a clinically meaningful difference.  A difference of 2-3 points in the total score of the Quality of Life Index has been associated with significant improvement in overall quality of life, self-image, physical symptoms, and general health in studies assessing change in quality of life.  PHQ-9: Recent Review Flowsheet Data    Depression screen [facility] 2/9 08/15/2018   Decreased Interest 0   Down, Depressed, Hopeless 1   PHQ -  2 Score 1   Altered sleeping 2   Tired, decreased energy 1   Change in appetite 0   Feeling bad or failure about yourself  1   Trouble concentrating 0   Moving slowly or fidgety/restless 0   Suicidal thoughts 0   PHQ-9 Score 5   Difficult doing work/chores Not difficult at all     Interpretation of Total Score  Total Score Depression Severity:  1-4 = Minimal depression, 5-9 = Mild depression, 10-14 = Moderate depression, 15-19 = Moderately severe depression, 20-27 = Severe depression   Psychosocial Evaluation and Intervention: Psychosocial Evaluation - 09/05/18 0920      Psychosocial Evaluation & Interventions   Interventions  Stress management education;Relaxation education;Encouraged to exercise with the program and follow exercise prescription    Comments  Counselor met with Mr. Bianchini Kohlbeck) today for initial psychosocial evaluation.  He is a 52 year old who had a heart attack and a stent  inserted in November.  He has a strong support system with a spouse; her children and his sister all living in the same home.  He has a son who lives in [place] that is also very supportive.  Kazim reports being healthy other than his heart and "mental health issues."  He admits to chronic sleep problem with maybe 2-3 hours of sleep nightly. He also admits to being diagnosed with Bipolar in 1998 and has refused medications since 2005.  He also reports having psychosis daily and he just "ignores it."  Maricela reports having an "okay" appetite and typically being in a positive mood.  He states that his primary stressors are relationship conflicts with adult "steps" who live in the same home.  He also has some financial concerns with being on disability since 1998 due to his mental health condition.  He denies current homicidal or suicidal ideations at this time.  Counselor recommended a sleep study to [place] since he reports his sleeping problems have been ongoing since he was a child.  he states that he is  "claustrophobic" and would not wear a mask if he was prescribed one so will likely not comply with this recommendation.  Kyllian has a goal for this program to be heart healthier.  He will be followed by staff.      Expected Outcomes  Short:  Darnelle is encouraged to exercise consistently for his health and mental health.  He is encouraged to check with his Dr. about a sleep study - but is reluctant to do so with his mental health issues.  Long:  Imran will continue to take his medications for his heart and abide by his Dr's orders concerning exercise and diet.      Continue Psychosocial Services   Follow up required by staff       Psychosocial Re-Evaluation:   Psychosocial Discharge (Final Psychosocial Re-Evaluation):   Vocational Rehabilitation: Provide vocational rehab assistance to qualifying candidates.   Vocational Rehab Evaluation & Intervention: Vocational Rehab - 08/15/18 1242      Initial Vocational Rehab Evaluation & Intervention   Assessment shows need for Vocational Rehabilitation  No       Education: Education Goals: Education classes will be provided on a variety of topics geared toward better understanding of heart health and risk factor modification. Participant will state understanding/return demonstration of topics presented as noted by education test scores.  Learning Barriers/Preferences: Learning Barriers/Preferences - 08/15/18 1242      Learning Barriers/Preferences   Learning Barriers  Reading    Learning Preferences  None       Education Topics:  AED/CPR: - Group verbal and written instruction with the use of models to demonstrate the basic use of the AED with the basic ABC's of resuscitation.   Cardiac Rehab from 09/05/2018 in [facility] Cardiac and Pulmonary Rehab  Date  09/05/18  Educator  CE  Instruction Review Code  1- Verbalizes Understanding      General Nutrition Guidelines/Fats and Fiber: -Group instruction provided by verbal, written  material, models and posters to present the general guidelines for heart healthy nutrition. Gives an explanation and review of dietary fats and fiber.   Controlling Sodium/Reading Food Labels: -Group verbal and written material supporting the discussion of sodium use in heart healthy nutrition. Review and explanation with models, verbal and written materials for utilization of the food label.   Exercise Physiology & General Exercise Guidelines: - Group verbal and written instruction with models to review the exercise physiology  of the cardiovascular system and associated critical values. Provides general exercise guidelines with specific guidelines to those with heart or lung disease.    Aerobic Exercise & Resistance Training: - Gives group verbal and written instruction on the various components of exercise. Focuses on aerobic and resistive training programs and the benefits of this training and how to safely progress through these programs..   Flexibility, Balance, Mind/Body Relaxation: Provides group verbal/written instruction on the benefits of flexibility and balance training, including mind/body exercise modes such as yoga, pilates and tai chi.  Demonstration and skill practice provided.   Stress and Anxiety: - Provides group verbal and written instruction about the health risks of elevated stress and causes of high stress.  Discuss the correlation between heart/lung disease and anxiety and treatment options. Review healthy ways to manage with stress and anxiety.   Depression: - Provides group verbal and written instruction on the correlation between heart/lung disease and depressed mood, treatment options, and the stigmas associated with seeking treatment.   Anatomy & Physiology of the Heart: - Group verbal and written instruction and models provide basic cardiac anatomy and physiology, with the coronary electrical and arterial systems. Review of Valvular disease and Heart  Failure   Cardiac Procedures: - Group verbal and written instruction to review commonly prescribed medications for heart disease. Reviews the medication, class of the drug, and side effects. Includes the steps to properly store meds and maintain the prescription regimen. (beta blockers and nitrates)   Cardiac Medications I: - Group verbal and written instruction to review commonly prescribed medications for heart disease. Reviews the medication, class of the drug, and side effects. Includes the steps to properly store meds and maintain the prescription regimen.   Cardiac Rehab from 09/05/2018 in [facility] Cardiac and Pulmonary Rehab  Date  08/22/18  Educator  CE  Instruction Review Code  1- Verbalizes Understanding      Cardiac Medications II: -Group verbal and written instruction to review commonly prescribed medications for heart disease. Reviews the medication, class of the drug, and side effects. (all other drug classes)    Go Sex-Intimacy & Heart Disease, Get SMART - Goal Setting: - Group verbal and written instruction through game format to discuss heart disease and the return to sexual intimacy. Provides group verbal and written material to discuss and apply goal setting through the application of the S.M.A.R.T. Method.   Other Matters of the Heart: - Provides group verbal, written materials and models to describe Stable Angina and Peripheral Artery. Includes description of the disease process and treatment options available to the cardiac patient.   Exercise & Equipment Safety: - Individual verbal instruction and demonstration of equipment use and safety with use of the equipment.   Cardiac Rehab from 09/05/2018 in [facility] Cardiac and Pulmonary Rehab  Date  08/15/18  Educator  [facility]  Instruction Review Code  1- Verbalizes Understanding      Infection Prevention: - Provides verbal and written material to individual with discussion of infection control including proper hand washing  and proper equipment cleaning during exercise session.   Cardiac Rehab from 09/05/2018 in [facility] Cardiac and Pulmonary Rehab  Date  08/15/18  Educator  [facility]  Instruction Review Code  1- Verbalizes Understanding      Falls Prevention: - Provides verbal and written material to individual with discussion of falls prevention and safety.   Cardiac Rehab from 09/05/2018 in [facility] Cardiac and Pulmonary Rehab  Date  08/15/18  Educator  [facility]  Instruction Review Code  1- Verbalizes Understanding      Diabetes: - Individual verbal and written instruction to review signs/symptoms of diabetes, desired ranges of glucose level fasting, after meals and with exercise. Acknowledge that pre and post exercise glucose checks will be done for 3 sessions at entry of program.   Know Your Numbers and Risk Factors: -Group verbal and written instruction about important numbers in your health.  Discussion of what are risk factors and how they play a role in the disease process.  Review of Cholesterol, Blood Pressure, Diabetes, and BMI and the role they play in your overall health.   Sleep Hygiene: -Provides group verbal and written instruction about how sleep can affect your health.  Define sleep hygiene, discuss sleep cycles and impact of sleep habits. Review good sleep hygiene tips.    Other: -Provides group and verbal instruction on various topics (see comments)   Knowledge Questionnaire Score: Knowledge Questionnaire Score - 08/15/18 1242      Knowledge Questionnaire Score   Pre Score  16/26   focus on risk factors, MI, Nutrition and Exercise      Core Components/Risk Factors/Patient Goals at Admission: Personal Goals and Risk Factors at Admission - 08/15/18 1241      Core Components/Risk Factors/Patient Goals on Admission    Weight Management  Yes;Weight Maintenance    Intervention  Weight Management: Develop a combined nutrition and exercise program designed to reach desired caloric intake, while  maintaining appropriate intake of nutrient and fiber, sodium and fats, and appropriate energy expenditure required for the weight goal.;Weight Management: Provide education and appropriate resources to help participant work on and attain dietary goals.    Admit Weight  140 lb 9.6 oz (63.8 kg)    Expected Outcomes  Short Term: Continue to assess and modify interventions until short term weight is achieved;Long Term: Adherence to nutrition and physical activity/exercise program aimed toward attainment of established weight goal;Weight Maintenance: Understanding of the daily nutrition guidelines, which includes 25-35% calories from fat, 7% or less cal from saturated fats, less than 275m cholesterol, less than 1.5gm of sodium, & 5 or more servings of fruits and vegetables daily;Understanding recommendations for meals to include 15-35% energy as protein, 25-35% energy from fat, 35-60% energy from carbohydrates, less than 2022mof dietary cholesterol, 20-35 gm of total fiber daily;Understanding of distribution of calorie intake throughout the day with the consumption of 4-5 meals/snacks    Tobacco Cessation  Yes    Number of packs per day  1.5   patient stated he has smoked for the past 45 years and does not plan to quit   Intervention  Assist the participant in steps to quit. Provide individualized education and counseling about committing to Tobacco Cessation, relapse prevention, and pharmacological support that can be provided by physician.;OfAdvice workerassist with locating and accessing local/national Quit Smoking programs, and support quit date choice.    Expected Outcomes  Short Term: Will demonstrate readiness to quit, by selecting a quit date.;Short Term: Will quit all tobacco product use, adhering to prevention of relapse plan.;Long Term: Complete abstinence from all tobacco products for at least 12 months from quit date.    Hypertension  Yes    Intervention  Monitor prescription use  compliance.;Provide education on lifestyle modifcations including regular physical activity/exercise, weight management, moderate sodium restriction and increased consumption of fresh fruit, vegetables, and low fat dairy, alcohol moderation, and smoking cessation.    Expected Outcomes  Short Term: Continued assessment and intervention until BP is <  140/63m HG in hypertensive participants. < 130/822mHG in hypertensive participants with diabetes, heart failure or chronic kidney disease.;Long Term: Maintenance of blood pressure at goal levels.       Core Components/Risk Factors/Patient Goals Review:    Core Components/Risk Factors/Patient Goals at Discharge (Final Review):    ITP Comments: ITP Comments    Row Name 08/15/18 1237 09/06/18 0614         ITP Comments  Med Review completed. Initial ITP created. Diagnosis can be found in [facility] 11/17  30 Day Review. Continue with ITP unless directed changes per Medical Director review New to program         Comments:

## 2018-09-09 ENCOUNTER — Encounter: Payer: Medicare HMO | Attending: Cardiovascular Disease | Admitting: *Deleted

## 2018-09-09 DIAGNOSIS — E785 Hyperlipidemia, unspecified: Secondary | ICD-10-CM | POA: Insufficient documentation

## 2018-09-09 DIAGNOSIS — F339 Major depressive disorder, recurrent, unspecified: Secondary | ICD-10-CM | POA: Insufficient documentation

## 2018-09-09 DIAGNOSIS — I2510 Atherosclerotic heart disease of native coronary artery without angina pectoris: Secondary | ICD-10-CM | POA: Insufficient documentation

## 2018-09-09 DIAGNOSIS — Z955 Presence of coronary angioplasty implant and graft: Secondary | ICD-10-CM | POA: Insufficient documentation

## 2018-09-09 DIAGNOSIS — F17210 Nicotine dependence, cigarettes, uncomplicated: Secondary | ICD-10-CM | POA: Diagnosis not present

## 2018-09-09 DIAGNOSIS — I214 Non-ST elevation (NSTEMI) myocardial infarction: Secondary | ICD-10-CM | POA: Insufficient documentation

## 2018-09-09 DIAGNOSIS — F410 Panic disorder [episodic paroxysmal anxiety] without agoraphobia: Secondary | ICD-10-CM | POA: Insufficient documentation

## 2018-09-09 NOTE — Progress Notes (Signed)
Daily Session Note  Patient Details  Name: Thomas Romero MRN: [id number] Date of Birth: Feb 16, 1966 Referring Provider:     Cardiac Rehab from 08/15/2018 in [facility] Cardiac and Pulmonary Rehab  Referring Provider  Kathlyn Sacramento MD      Encounter Date: 09/09/2018  Check In: Session Check In - 09/09/18 0926      Check-In   Supervising physician immediately available to respond to emergencies  See telemetry face sheet for immediately available ER MD    Location  [facility]-Cardiac & Pulmonary Rehab    Staff Present  Nyoka Cowden, RN, BSN, MA;Jerlyn Pain Sherryll Burger, RN BSN;Amanda Sommer, BA, ACSM CEP, Exercise Physiologist    Medication changes reported      No    Fall or balance concerns reported     No    Tobacco Cessation  No Change    Warm-up and Cool-down  Performed as group-led instruction    Resistance Training Performed  Yes    VAD Patient?  No    PAD/SET Patient?  No      Pain Assessment   Currently in Pain?  No/denies          Social History   Tobacco Use  Smoking Status Current Every Day Smoker  . Packs/day: 1.50  . Years: 36.00  . Pack years: 54.00  . Types: Cigarettes  Smokeless Tobacco Never Used  Tobacco Comment   Patient 5 PPD; recently cut back to 1.5 daily    Goals Met:  Independence with exercise equipment Exercise tolerated well No report of cardiac concerns or symptoms Strength training completed today  Goals Unmet:  Not Applicable  Comments: Pt able to follow exercise prescription today without complaint.  Will continue to monitor for progression.    Dr. Emily Filbert is Medical Director for [place] and [name].

## 2018-09-12 ENCOUNTER — Encounter: Payer: Medicare HMO | Admitting: *Deleted

## 2018-09-12 DIAGNOSIS — Z955 Presence of coronary angioplasty implant and graft: Secondary | ICD-10-CM

## 2018-09-12 DIAGNOSIS — I214 Non-ST elevation (NSTEMI) myocardial infarction: Secondary | ICD-10-CM

## 2018-09-12 NOTE — Progress Notes (Signed)
Daily Session Note  Patient Details  Name: Thomas Romero MRN: [id number] Date of Birth: March 06, 1966 Referring Provider:     Cardiac Rehab from 08/15/2018 in [facility] Cardiac and Pulmonary Rehab  Referring Provider  Kathlyn Sacramento MD      Encounter Date: 09/12/2018  Check In: Session Check In - 09/12/18 0744      Check-In   Supervising physician immediately available to respond to emergencies  See telemetry face sheet for immediately available ER MD    Location  [facility]-Cardiac & Pulmonary Rehab    Staff Present  Earlean Shawl, BS, ACSM CEP, Exercise Physiologist;Jessica Luan Pulling, MA, RCEP, CCRP, Exercise Physiologist;Carroll Enterkin, Therapist, sports, BSN    Medication changes reported      No    Fall or balance concerns reported     No    Tobacco Cessation  No Change    Warm-up and Cool-down  Performed as group-led instruction    Resistance Training Performed  Yes    VAD Patient?  No    PAD/SET Patient?  No      Pain Assessment   Currently in Pain?  No/denies    Multiple Pain Sites  No          Social History   Tobacco Use  Smoking Status Current Every Day Smoker  . Packs/day: 1.50  . Years: 36.00  . Pack years: 54.00  . Types: Cigarettes  Smokeless Tobacco Never Used  Tobacco Comment   Patient 5 PPD; recently cut back to 1.5 daily    Goals Met:  Independence with exercise equipment Exercise tolerated well No report of cardiac concerns or symptoms Strength training completed today  Goals Unmet:  Not Applicable  Comments: Pt able to follow exercise prescription today without complaint.  Will continue to monitor for progression.    Dr. Emily Filbert is Medical Director for [place] and [name].

## 2018-09-14 DIAGNOSIS — I214 Non-ST elevation (NSTEMI) myocardial infarction: Secondary | ICD-10-CM

## 2018-09-14 NOTE — Progress Notes (Signed)
Daily Session Note  Patient Details  Name: Thomas Romero MRN: [id number] Date of Birth: 1965/12/01 Referring Provider:     Cardiac Rehab from 08/15/2018 in [facility] Cardiac and Pulmonary Rehab  Referring Provider  Kathlyn Sacramento MD      Encounter Date: 09/14/2018  Check In: Session Check In - 09/14/18 0748      Check-In   Supervising physician immediately available to respond to emergencies  See telemetry face sheet for immediately available ER MD    Location  [facility]-Cardiac & Pulmonary Rehab    Staff Present  Heath Lark, RN, BSN, CCRP;Jessica Ben Arnold, MA, RCEP, CCRP, Exercise Physiologist;Morris Markham Tessie Fass RCP,RRT,BSRT    Medication changes reported      No    Fall or balance concerns reported     No    Warm-up and Cool-down  Performed as group-led instruction    Resistance Training Performed  Yes    VAD Patient?  No    PAD/SET Patient?  No      Pain Assessment   Currently in Pain?  No/denies          Social History   Tobacco Use  Smoking Status Current Every Day Smoker  . Packs/day: 1.50  . Years: 36.00  . Pack years: 54.00  . Types: Cigarettes  Smokeless Tobacco Never Used  Tobacco Comment   Patient 5 PPD; recently cut back to 1.5 daily    Goals Met:  Independence with exercise equipment Exercise tolerated well No report of cardiac concerns or symptoms Strength training completed today  Goals Unmet:  Not Applicable  Comments: Pt able to follow exercise prescription today without complaint.  Will continue to monitor for progression.    Dr. Emily Filbert is Medical Director for [place] and [name].

## 2018-09-16 ENCOUNTER — Encounter: Payer: Medicare HMO | Admitting: *Deleted

## 2018-09-16 DIAGNOSIS — I214 Non-ST elevation (NSTEMI) myocardial infarction: Secondary | ICD-10-CM

## 2018-09-16 DIAGNOSIS — Z955 Presence of coronary angioplasty implant and graft: Secondary | ICD-10-CM

## 2018-09-16 NOTE — Progress Notes (Signed)
Daily Session Note  Patient Details  Name: Thomas Romero MRN: [id number] Date of Birth: [birth year] Referring Provider:     Cardiac Rehab from 08/15/2018 in [facility] Cardiac and Pulmonary Rehab  Referring Provider  Kathlyn Sacramento MD      Encounter Date: 09/16/2018  Check In: Session Check In - 09/16/18 0747      Check-In   Supervising physician immediately available to respond to emergencies  See telemetry face sheet for immediately available ER MD    Location  [facility]-Cardiac & Pulmonary Rehab    Staff Present  Renita Papa, RN BSN;Dazia Lippold Luan Pulling, MA, RCEP, CCRP, Exercise Physiologist;Amanda Oletta Darter, [place], ACSM CEP, Exercise Physiologist    Medication changes reported      No    Fall or balance concerns reported     No    Warm-up and Cool-down  Performed as group-led instruction    Resistance Training Performed  Yes    VAD Patient?  No    PAD/SET Patient?  No      Pain Assessment   Currently in Pain?  No/denies          Social History   Tobacco Use  Smoking Status Current Every Day Smoker  . Packs/day: 1.50  . Years: 36.00  . Pack years: 54.00  . Types: Cigarettes  Smokeless Tobacco Never Used  Tobacco Comment   Patient 5 PPD; recently cut back to 1.5 daily    Goals Met:  Independence with exercise equipment Exercise tolerated well Personal goals reviewed No report of cardiac concerns or symptoms Strength training completed today  Goals Unmet:  Not Applicable  Comments: Pt able to follow exercise prescription today without complaint.  Will continue to monitor for progression.    Dr. Emily Filbert is Medical Director for [place] and [name].

## 2018-09-19 ENCOUNTER — Encounter: Payer: Medicare HMO | Admitting: *Deleted

## 2018-09-19 DIAGNOSIS — Z955 Presence of coronary angioplasty implant and graft: Secondary | ICD-10-CM

## 2018-09-19 DIAGNOSIS — I214 Non-ST elevation (NSTEMI) myocardial infarction: Secondary | ICD-10-CM | POA: Diagnosis not present

## 2018-09-19 NOTE — Progress Notes (Signed)
Daily Session Note  Patient Details  Name: Thomas Romero MRN: [id number] Date of Birth: [birth year] Referring Provider:     Cardiac Rehab from 08/15/2018 in [facility] Cardiac and Pulmonary Rehab  Referring Provider  Kathlyn Sacramento MD      Encounter Date: 09/19/2018  Check In: Session Check In - 09/19/18 0847      Check-In   Supervising physician immediately available to respond to emergencies  See telemetry face sheet for immediately available ER MD    Location  [facility]-Cardiac & Pulmonary Rehab    Staff Present  Heath Lark, RN, BSN, CCRP;Jessica [place], MA, RCEP, CCRP, Exercise Physiologist;Kelly Amedeo Plenty, BS, ACSM CEP, Exercise Physiologist    Medication changes reported      No    Fall or balance concerns reported     No    Warm-up and Cool-down  Performed as group-led instruction    Resistance Training Performed  Yes    VAD Patient?  No    PAD/SET Patient?  No      Pain Assessment   Currently in Pain?  No/denies          Social History   Tobacco Use  Smoking Status Current Every Day Smoker  . Packs/day: 1.50  . Years: 36.00  . Pack years: 54.00  . Types: Cigarettes  Smokeless Tobacco Never Used  Tobacco Comment   Patient 5 PPD; recently cut back to 1.5 daily    Goals Met:  Independence with exercise equipment Exercise tolerated well No report of cardiac concerns or symptoms Strength training completed today  Goals Unmet:  Not Applicable  Comments: Pt able to follow exercise prescription today without complaint.  Will continue to monitor for progression.    Dr. Emily Filbert is Medical Director for [place] and [name].

## 2018-09-21 DIAGNOSIS — I214 Non-ST elevation (NSTEMI) myocardial infarction: Secondary | ICD-10-CM

## 2018-09-21 NOTE — Progress Notes (Signed)
Daily Session Note  Patient Details  Name: Thomas Romero MRN: [id number] Date of Birth: Mar 27, 1966 Referring Provider:     Cardiac Rehab from 08/15/2018 in [facility] Cardiac and Pulmonary Rehab  Referring Provider  Kathlyn Sacramento MD      Encounter Date: 09/21/2018  Check In: Session Check In - 09/21/18 0842      Check-In   Supervising physician immediately available to respond to emergencies  See telemetry face sheet for immediately available ER MD    Location  [facility]-Cardiac & Pulmonary Rehab    Staff Present  Justin Mend Lorre Nick, MA, RCEP, CCRP, Exercise Physiologist;Susanne Bice, RN, BSN, CCRP    Medication changes reported      No    Fall or balance concerns reported     No    Warm-up and Cool-down  Performed as group-led instruction    Resistance Training Performed  Yes    VAD Patient?  No    PAD/SET Patient?  No      Pain Assessment   Currently in Pain?  No/denies          Social History   Tobacco Use  Smoking Status Current Every Day Smoker  . Packs/day: 1.50  . Years: 36.00  . Pack years: 54.00  . Types: Cigarettes  Smokeless Tobacco Never Used  Tobacco Comment   Patient 5 PPD; recently cut back to 1.5 daily    Goals Met:  Independence with exercise equipment Exercise tolerated well No report of cardiac concerns or symptoms Strength training completed today  Goals Unmet:  Not Applicable  Comments: Pt able to follow exercise prescription today without complaint.  Will continue to monitor for progression.    Dr. Emily Filbert is Medical Director for [place] and [name].

## 2018-09-23 ENCOUNTER — Encounter: Payer: Medicare HMO | Admitting: *Deleted

## 2018-09-23 DIAGNOSIS — I214 Non-ST elevation (NSTEMI) myocardial infarction: Secondary | ICD-10-CM

## 2018-09-23 DIAGNOSIS — Z955 Presence of coronary angioplasty implant and graft: Secondary | ICD-10-CM

## 2018-09-23 NOTE — Progress Notes (Signed)
Daily Session Note  Patient Details  Name: Thomas Romero MRN: [id number] Date of Birth: 1966/01/16 Referring Provider:     Cardiac Rehab from 08/15/2018 in [facility] Cardiac and Pulmonary Rehab  Referring Provider  Kathlyn Sacramento MD      Encounter Date: 09/23/2018  Check In: Session Check In - 09/23/18 0915      Check-In   Supervising physician immediately available to respond to emergencies  See telemetry face sheet for immediately available ER MD    Location  [facility]-Cardiac & Pulmonary Rehab    Staff Present  Renita Papa, RN BSN;Jessica Luan Pulling, MA, RCEP, CCRP, Exercise Physiologist;Amanda Oletta Darter, [place], ACSM CEP, Exercise Physiologist    Medication changes reported      No    Fall or balance concerns reported     No    Tobacco Cessation  No Change    Warm-up and Cool-down  Performed as group-led instruction    Resistance Training Performed  Yes    VAD Patient?  No    PAD/SET Patient?  No      Pain Assessment   Currently in Pain?  No/denies          Social History   Tobacco Use  Smoking Status Current Every Day Smoker  . Packs/day: 1.50  . Years: 36.00  . Pack years: 54.00  . Types: Cigarettes  Smokeless Tobacco Never Used  Tobacco Comment   Patient 5 PPD; recently cut back to 1.5 daily    Goals Met:  Independence with exercise equipment Exercise tolerated well No report of cardiac concerns or symptoms Strength training completed today  Goals Unmet:  Not Applicable  Comments: Pt able to follow exercise prescription today without complaint.  Will continue to monitor for progression.    Dr. Emily Filbert is Medical Director for [place] and [name].

## 2018-09-26 ENCOUNTER — Encounter: Payer: Medicare HMO | Admitting: *Deleted

## 2018-09-26 DIAGNOSIS — Z955 Presence of coronary angioplasty implant and graft: Secondary | ICD-10-CM

## 2018-09-26 DIAGNOSIS — I214 Non-ST elevation (NSTEMI) myocardial infarction: Secondary | ICD-10-CM | POA: Diagnosis not present

## 2018-09-26 NOTE — Progress Notes (Signed)
Daily Session Note  Patient Details  Name: Thomas Romero MRN: [id number] Date of Birth: 10-01-1965 Referring Provider:     Cardiac Rehab from 08/15/2018 in [facility] Cardiac and Pulmonary Rehab  Referring Provider  Kathlyn Sacramento MD      Encounter Date: 09/26/2018  Check In: Session Check In - 09/26/18 0750      Check-In   Supervising physician immediately available to respond to emergencies  See telemetry face sheet for immediately available ER MD    Location  [facility]-Cardiac & Pulmonary Rehab    Staff Present  Earlean Shawl, BS, ACSM CEP, Exercise Physiologist;Jessica Luan Pulling, MA, RCEP, CCRP, Exercise Physiologist;Susanne Bice, RN, BSN, CCRP    Medication changes reported      No    Fall or balance concerns reported     No    Tobacco Cessation  No Change    Warm-up and Cool-down  Performed as group-led instruction    Resistance Training Performed  Yes    VAD Patient?  No    PAD/SET Patient?  No      Pain Assessment   Currently in Pain?  No/denies    Multiple Pain Sites  No          Social History   Tobacco Use  Smoking Status Current Every Day Smoker  . Packs/day: 1.50  . Years: 36.00  . Pack years: 54.00  . Types: Cigarettes  Smokeless Tobacco Never Used  Tobacco Comment   Patient 5 PPD; recently cut back to 1.5 daily    Goals Met:  Independence with exercise equipment Exercise tolerated well No report of cardiac concerns or symptoms Strength training completed today  Goals Unmet:  Not Applicable  Comments: Pt able to follow exercise prescription today without complaint.  Will continue to monitor for progression.    Dr. Emily Filbert is Medical Director for [place] and [name].

## 2018-09-26 NOTE — Progress Notes (Signed)
Cardiology Office Note Date:  09/28/2018  Patient ID:  Thomas Romero, DOB 1966-02-01, MRN [id number] PCP:  Patient, No Pcp Per  Cardiologist:  Dr. Kirke CorinArida, MD    Chief Complaint: Follow-up  History of Present Illness: Thomas Romero is a 53 y.o. male with history of CAD with inferior STEMI in 07/2018 as detailed below, prolonged tobacco abuse, prior gastric ulcer, HLD, anxiety with panic attacks, and depression who presents for follow up of his CAD.    He was admitted in 07/2018 with chest pain. EKG showed 1 mm ST segment elevation in aVR with diffuse ST segment depression. He was initially treated medically, though continued to have chest pain with repeat EKG showing borderline ST elevation in posterior leads. Urgent LHC was performed which showed 99% stenosis in the mid RCA which was the culprit and was successfully treated with PCI/DES. There was moderate mid LCx disease of 60% that was left to be managed medically as well as 30% stenosis of the proximal to mid RCA. The patient left AGAINST MEDICAL ADVICE on the same day as his procedure as he wanted to smoke and did not want to stay. His prescriptions were sent to the pharmacy and he did pick them up.   Labs: UDS negative, LDL 152, K+ 3.3, SCr 0.94, glucose 139, WBC 10.9, HGB 13.9, PLT 171, troponin 18.51, AST 72, magnesium 2.1  He was seen in follow up by Dr. Kirke CorinArida on 08/02/2018 and reported no recurrent chest pain. He continued to smoke, though had cut back. He underwent echo on 08/25/2018 that showed an EF of 55-65%, very mild inferior HK most notable in the basal inferior wall, Gr1DD, mild MR, LA normal in size, RVSF normal, PASP normal. He has since been enrolled in cardiac rehab and has been participating.   She comes in accompanied by his wife today and is doing well from a cardiac perspective.  No chest pain shortness of breath, dizziness, diaphoresis, presyncope, or syncope.  No palpitations.  No lower extremity swelling, abdominal  distention, orthopnea, PND, early satiety.  No falls since he was last seen.  He is doing well in cardiac rehab and enjoys this.  Energy continues to improve.  He continues to smoke 1-1/2 to 2 packs of cigarettes daily and has no interest in quitting.  He states, "the day I quit will be the day they put me in the ground."  He has been smoking since age 89 and previously had a max of 5 packs/day in his teenage years.  He has previously tried nicotine patches without success and refuses our assistance in quitting today.  He has been compliant with all medications including dual antiplatelet therapy.  No BRBPR or melena.  He does not have any issues or concerns today.   Past Medical History:  Diagnosis Date  . Coronary artery disease    Non-ST elevation myocardial infarction in November 2019.  Cardiac catheterization showed 99% stenosis in the mid RCA treated successfully with PCI and drug-eluting stent placement in moderate mid left circumflex disease.  . Gastric ulcer   . Hyperlipidemia   . Manic depression (HCC)   . Panic attacks   . Tobacco use     Past Surgical History:  Procedure Laterality Date  . CORONARY/GRAFT ACUTE MI REVASCULARIZATION N/A 07/25/2018   Procedure: Coronary/Graft Acute MI Revascularization;  Surgeon: Iran OuchArida, Muhammad A, MD;  Location: [facility] INVASIVE CV LAB;  Service: Cardiovascular;  Laterality: N/A;  . LEFT HEART CATH AND CORONARY ANGIOGRAPHY N/A  07/25/2018   Procedure: LEFT HEART CATH AND CORONARY ANGIOGRAPHY;  Surgeon: Iran Ouch, MD;  Location: [facility] INVASIVE CV LAB;  Service: Cardiovascular;  Laterality: N/A;  . SMALL INTESTINE SURGERY      Current Meds  Medication Sig  . aspirin 81 MG chewable tablet Chew 1 tablet (81 mg total) by mouth daily.  Marland Kitchen atorvastatin (LIPITOR) 80 MG tablet Take 1 tablet (80 mg total) by mouth daily at 6 PM.  . nitroGLYCERIN (NITROSTAT) 0.4 MG SL tablet Place 1 tablet (0.4 mg total) under the tongue every 5 (five) minutes as needed  for chest pain.  . pantoprazole (PROTONIX) 40 MG tablet Take 1 tablet (40 mg total) by mouth daily.  . ticagrelor (BRILINTA) 90 MG TABS tablet Take 1 tablet (90 mg total) by mouth 2 (two) times daily.    Allergies:   Ibuprofen   Social History:  The patient  reports that he has been smoking cigarettes. He has a 72.00 pack-year smoking history. He has never used smokeless tobacco. He reports current alcohol use. He reports that he does not use drugs.   Family History:  The patient's family history includes Heart attack in his brother, mother, and sister; Stroke in his father.  ROS:   Review of Systems  Constitutional: Negative for chills, diaphoresis, fever, malaise/fatigue and weight loss.  HENT: Negative for congestion.   Eyes: Negative for discharge and redness.  Respiratory: Negative for cough, hemoptysis, sputum production, shortness of breath and wheezing.   Cardiovascular: Negative for chest pain, palpitations, orthopnea, claudication, leg swelling and PND.  Gastrointestinal: Negative for abdominal pain, blood in stool, heartburn, melena, nausea and vomiting.  Genitourinary: Negative for hematuria.  Musculoskeletal: Negative for falls and myalgias.  Skin: Negative for rash.  Neurological: Negative for dizziness, tingling, tremors, sensory change, speech change, focal weakness, loss of consciousness and weakness.  Endo/Heme/Allergies: Does not bruise/bleed easily.  Psychiatric/Behavioral: Negative for substance abuse. The patient is not nervous/anxious.   All other systems reviewed and are negative.    PHYSICAL EXAM:  VS:  BP 120/70 (BP Location: Left Arm, Patient Position: Sitting, Cuff Size: Normal)   Pulse 63   Ht 5\' 10"  (1.778 m)   Wt 147 lb 8 oz (66.9 kg)   BMI 21.16 kg/m  BMI: Body mass index is 21.16 kg/m.  Physical Exam  Constitutional: He is oriented to person, place, and time. He appears well-developed and well-nourished.  HENT:  Head: Normocephalic and  atraumatic.  Eyes: Right eye exhibits no discharge. Left eye exhibits no discharge.  Neck: Normal range of motion. No JVD present.  Cardiovascular: Normal rate, regular rhythm, S1 normal, S2 normal and normal heart sounds. Exam reveals no distant heart sounds, no friction rub, no midsystolic click and no opening snap.  No murmur heard. Pulses:      Posterior tibial pulses are 2+ on the right side and 2+ on the left side.  Pulmonary/Chest: Effort normal and breath sounds normal. No respiratory distress. He has no decreased breath sounds. He has no wheezes. He has no rales. He exhibits no tenderness.  Abdominal: Soft. He exhibits no distension. There is no abdominal tenderness.  Musculoskeletal:        General: No edema.  Neurological: He is alert and oriented to person, place, and time.  Skin: Skin is warm and dry. No cyanosis. Nails show no clubbing.  Psychiatric: He has a normal mood and affect. His speech is normal and behavior is normal. Judgment and thought content normal.  EKG:  Was ordered and interpreted by me today. Shows NSR, 63 bpm, inferior Q waves, inferior T wave inversion, nonspecific lateral ST-T changes (unchanged from prior)  Recent Labs: 07/25/2018: ALT 22; BUN 15; Creatinine, Ser 0.94; Hemoglobin 13.9; Magnesium 2.1; Platelets 171; Potassium 3.3; Sodium 138  07/25/2018: Cholesterol 203; HDL 24; LDL Cholesterol 152; Total CHOL/HDL Ratio 8.5; Triglycerides 134; VLDL 27   CrCl cannot be calculated (Patient's most recent lab result is older than the maximum 21 days allowed.).   Wt Readings from Last 3 Encounters:  09/28/18 147 lb 8 oz (66.9 kg)  08/15/18 140 lb 9.6 oz (63.8 kg)  08/02/18 140 lb 8 oz (63.7 kg)     Other studies reviewed: Additional studies/records reviewed today include: summarized above  ASSESSMENT AND PLAN:  1. CAD involving the native coronary arteries with recent inferior ST elevation MI without angina: He is doing well without any symptoms  concerning for angina.  Continue dual antiplatelet therapy with aspirin 81 mg daily and Brilinta 90 mg twice daily without interruption for at least a total of 12 months from PCI.  Continue medical management of residual LCx disease with Lipitor.  Aggressive risk factor modification and smoking cessation is strongly recommended though refused by the patient.  Continue secondary prevention.  2. Hyperlipidemia: LDL of 152 during admission while not on a statin.  He reports compliance with Lipitor.  Check lipid panel, direct LDL, and CMP.  Goal LDL less than 70.  3. Ongoing tobacco abuse: Long discussion with the patient regarding the importance of smoking cessation.  He refuses at this time.  He declines our assistance today.  He is aware of his risks.  Disposition: F/u with Dr. Kirke CorinArida or an APP in 6 months.  Current medicines are reviewed at length with the patient today.  The patient did not have any concerns regarding medicines.  Signed, Eula Listenyan Charle Mclaurin, PA-C 09/28/2018 2:44 PM     [facility] [place] [address] [place], [place] [address] [phone]

## 2018-09-28 ENCOUNTER — Encounter: Payer: Self-pay | Admitting: Physician Assistant

## 2018-09-28 ENCOUNTER — Ambulatory Visit (INDEPENDENT_AMBULATORY_CARE_PROVIDER_SITE_OTHER): Payer: Medicare HMO | Admitting: Physician Assistant

## 2018-09-28 VITALS — BP 120/70 | HR 63 | Ht 70.0 in | Wt 147.5 lb

## 2018-09-28 DIAGNOSIS — I2510 Atherosclerotic heart disease of native coronary artery without angina pectoris: Secondary | ICD-10-CM | POA: Diagnosis not present

## 2018-09-28 DIAGNOSIS — I214 Non-ST elevation (NSTEMI) myocardial infarction: Secondary | ICD-10-CM

## 2018-09-28 DIAGNOSIS — E785 Hyperlipidemia, unspecified: Secondary | ICD-10-CM

## 2018-09-28 DIAGNOSIS — Z720 Tobacco use: Secondary | ICD-10-CM

## 2018-09-28 NOTE — Patient Instructions (Signed)
Medication Instructions:  No changes If you need a refill on your cardiac medications before your next appointment, please call your pharmacy.   Lab work: Your provider would like for you to have the following labs today: CBC, CMET, Lipid and direct LDL.  If you have labs (blood work) drawn today and your tests are completely normal, you will receive your results only by: Marland Kitchen MyChart Message (if you have MyChart) OR . A paper copy in the mail If you have any lab test that is abnormal or we need to change your treatment, we will call you to review the results.  Testing/Procedures: None ordered  Follow-Up: At [facility], you and your health needs are our priority.  As part of our continuing mission to provide you with exceptional heart care, we have created designated Provider Care Teams.  These Care Teams include your primary Cardiologist (physician) and Advanced Practice Providers (APPs -  Physician Assistants and Nurse Practitioners) who all work together to provide you with the care you need, when you need it. You will need a follow up appointment in 6 months.  Please call our office 2 months in advance to schedule this appointment.  You may see Lorine Bears, MD or one of the following Advanced Practice Providers on your designated Care Team:   Nicolasa Ducking, NP Eula Listen, PA-C

## 2018-09-28 NOTE — Progress Notes (Signed)
Daily Session Note  Patient Details  Name: Thomas Romero MRN: [id number] Date of Birth: 1966/02/26 Referring Provider:     Cardiac Rehab from 08/15/2018 in [facility] Cardiac and Pulmonary Rehab  Referring Provider  Kathlyn Sacramento MD      Encounter Date: 09/28/2018  Check In: Session Check In - 09/28/18 0756      Check-In   Supervising physician immediately available to respond to emergencies  See telemetry face sheet for immediately available ER MD    Location  [facility]-Cardiac & Pulmonary Rehab    Staff Present  Heath Lark, RN, BSN, CCRP;Jessica [place], MA, RCEP, CCRP, Exercise Physiologist;Joseph Tessie Fass RCP,RRT,BSRT    Medication changes reported      No    Fall or balance concerns reported     No    Tobacco Cessation  No Change    Warm-up and Cool-down  Performed as group-led instruction    Resistance Training Performed  Yes    VAD Patient?  No      Pain Assessment   Currently in Pain?  No/denies          Social History   Tobacco Use  Smoking Status Current Every Day Smoker  . Packs/day: 1.50  . Years: 36.00  . Pack years: 54.00  . Types: Cigarettes  Smokeless Tobacco Never Used  Tobacco Comment   Patient 5 PPD; recently cut back to 1.5 daily    Goals Met:  Independence with exercise equipment Exercise tolerated well No report of cardiac concerns or symptoms Strength training completed today  Goals Unmet:  Not Applicable  Comments: Pt able to follow exercise prescription today without complaint.  Will continue to monitor for progression.    Dr. Emily Filbert is Medical Director for [place] and [name].

## 2018-09-29 ENCOUNTER — Telehealth: Payer: Self-pay | Admitting: Cardiovascular Disease

## 2018-09-29 ENCOUNTER — Telehealth: Payer: Self-pay | Admitting: Physician Assistant

## 2018-09-29 DIAGNOSIS — Z79899 Other long term (current) drug therapy: Secondary | ICD-10-CM

## 2018-09-29 DIAGNOSIS — E785 Hyperlipidemia, unspecified: Secondary | ICD-10-CM

## 2018-09-29 LAB — LDL CHOLESTEROL, DIRECT: LDL DIRECT: 105 mg/dL — AB (ref 0–99)

## 2018-09-29 LAB — COMPREHENSIVE METABOLIC PANEL
A/G RATIO: 2.5 — AB (ref 1.2–2.2)
ALT: 42 IU/L (ref 0–44)
AST: 26 IU/L (ref 0–40)
Albumin: 4.9 g/dL (ref 3.8–4.9)
Alkaline Phosphatase: 103 IU/L (ref 39–117)
BUN / CREAT RATIO: 7 — AB (ref 9–20)
BUN: 7 mg/dL (ref 6–24)
Bilirubin Total: 0.3 mg/dL (ref 0.0–1.2)
CALCIUM: 9.7 mg/dL (ref 8.7–10.2)
CO2: 22 mmol/L (ref 20–29)
Chloride: 103 mmol/L (ref 96–106)
Creatinine, Ser: 0.96 mg/dL (ref 0.76–1.27)
GFR, EST AFRICAN AMERICAN: 105 mL/min/{1.73_m2} (ref >59)
GFR, EST NON AFRICAN AMERICAN: 91 mL/min/{1.73_m2} (ref >59)
Globulin, Total: 2 g/dL (ref 1.5–4.5)
Glucose: 90 mg/dL (ref 65–99)
POTASSIUM: 4.1 mmol/L (ref 3.5–5.2)
SODIUM: 140 mmol/L (ref 134–144)
TOTAL PROTEIN: 6.9 g/dL (ref 6.0–8.5)

## 2018-09-29 LAB — LIPID PANEL
CHOL/HDL RATIO: 6 ratio — AB (ref 0.0–5.0)
Cholesterol, Total: 149 mg/dL (ref 100–199)
HDL: 25 mg/dL — ABNORMAL LOW (ref >39)
LDL CALC: 99 mg/dL (ref 0–99)
Triglycerides: 126 mg/dL (ref 0–149)
VLDL Cholesterol Cal: 25 mg/dL (ref 5–40)

## 2018-09-29 LAB — CBC
Hematocrit: 45.4 % (ref 37.5–51.0)
Hemoglobin: 15.9 g/dL (ref 13.0–17.7)
MCH: 31 pg (ref 26.6–33.0)
MCHC: 35 g/dL (ref 31.5–35.7)
MCV: 89 fL (ref 79–97)
Platelets: 232 10*3/uL (ref 150–450)
RBC: 5.13 x10E6/uL (ref 4.14–5.80)
RDW: 13 % (ref 11.6–15.4)
WBC: 7.3 10*3/uL (ref 3.4–10.8)

## 2018-09-29 MED ORDER — EZETIMIBE 10 MG PO TABS: 10.0000 mg | ORAL_TABLET | Freq: Every day | ORAL | 3 refills | Status: DC

## 2018-09-29 NOTE — Telephone Encounter (Signed)
I spoke with the patient regarding his lab results from 09/28/2018. He is aware of Eula Listenyan Dunn, PA's recommendations to add Zetia 10 mg once daily and recheck his lipid/ liver profile in ~ 8 weeks.  The patient is agreeable with the above recommendations. He would like his Zetia RX sent to the [facility] on [address]  I have advised him I will have scheduling contact him to arrange for a FASTING lipid/ liver profile in 8 weeks.   The patient voices understanding of the above.

## 2018-09-29 NOTE — Telephone Encounter (Signed)
Will call when March schedule is out

## 2018-09-29 NOTE — Telephone Encounter (Signed)
-----   Message from Jefferey Pica, RN sent at 09/29/2018 10:15 AM EST ----- I spoke with the patient. He needs to come in in 8 weeks for a repeat FASTING lipid/ liver profile in the office.  Orders placed.   Please call to schedule!!  Thank you!

## 2018-09-30 ENCOUNTER — Encounter: Payer: Medicare HMO | Admitting: *Deleted

## 2018-09-30 DIAGNOSIS — Z955 Presence of coronary angioplasty implant and graft: Secondary | ICD-10-CM

## 2018-09-30 DIAGNOSIS — I214 Non-ST elevation (NSTEMI) myocardial infarction: Secondary | ICD-10-CM | POA: Diagnosis not present

## 2018-09-30 NOTE — Progress Notes (Signed)
Daily Session Note  Patient Details  Name: Thomas Romero MRN: [id number] Date of Birth: 10/10/1965 Referring Provider:     Cardiac Rehab from 08/15/2018 in [facility] Cardiac and Pulmonary Rehab  Referring Provider  Arida, Muhammad MD      Encounter Date: 09/30/2018  Check In: Session Check In - 09/30/18 0916      Check-In   Supervising physician immediately available to respond to emergencies  See telemetry face sheet for immediately available ER MD    Location  [facility]-Cardiac & Pulmonary Rehab    Staff Present  Meredith Craven, RN BSN;Jessica Hawkins, MA, RCEP, CCRP, Exercise Physiologist;Amanda Sommer, BA, ACSM CEP, Exercise Physiologist    Medication changes reported      No    Fall or balance concerns reported     No    Warm-up and Cool-down  Performed as group-led instruction    Resistance Training Performed  Yes    VAD Patient?  No    PAD/SET Patient?  No      Pain Assessment   Currently in Pain?  No/denies          Social History   Tobacco Use  Smoking Status Current Every Day Smoker  . Packs/day: 2.00  . Years: 36.00  . Pack years: 72.00  . Types: Cigarettes  Smokeless Tobacco Never Used  Tobacco Comment   Patient 5 PPD; recently cut back to 1.5 daily    Goals Met:  Independence with exercise equipment Exercise tolerated well No report of cardiac concerns or symptoms Strength training completed today  Goals Unmet:  Not Applicable  Comments: Pt able to follow exercise prescription today without complaint.  Will continue to monitor for progression.    Dr. Mark Miller is Medical Director for [facility] and [name]. 

## 2018-10-03 ENCOUNTER — Encounter: Payer: Medicare HMO | Admitting: *Deleted

## 2018-10-03 DIAGNOSIS — Z955 Presence of coronary angioplasty implant and graft: Secondary | ICD-10-CM

## 2018-10-03 DIAGNOSIS — I214 Non-ST elevation (NSTEMI) myocardial infarction: Secondary | ICD-10-CM | POA: Diagnosis not present

## 2018-10-03 NOTE — Progress Notes (Signed)
Daily Session Note  Patient Details  Name: Thomas Romero MRN: [id number] Date of Birth: 1966-07-21 Referring Provider:     Cardiac Rehab from 08/15/2018 in [facility] Cardiac and Pulmonary Rehab  Referring Provider  Kathlyn Sacramento MD      Encounter Date: 10/03/2018  Check In: Session Check In - 10/03/18 0750      Check-In   Supervising physician immediately available to respond to emergencies  See telemetry face sheet for immediately available ER MD    Location  [facility]-Cardiac & Pulmonary Rehab    Staff Present  Constance Goltz, RN BSN;Pansey Pinheiro Luan Pulling, MA, RCEP, CCRP, Exercise Physiologist;Kelly Amedeo Plenty, BS, ACSM CEP, Exercise Physiologist    Medication changes reported      No    Fall or balance concerns reported     No    Warm-up and Cool-down  Performed as group-led instruction    Resistance Training Performed  Yes    VAD Patient?  No    PAD/SET Patient?  No      Pain Assessment   Currently in Pain?  No/denies          Social History   Tobacco Use  Smoking Status Current Every Day Smoker  . Packs/day: 2.00  . Years: 36.00  . Pack years: 72.00  . Types: Cigarettes  Smokeless Tobacco Never Used  Tobacco Comment   Patient 5 PPD; recently cut back to 1.5 daily    Goals Met:  Independence with exercise equipment Exercise tolerated well No report of cardiac concerns or symptoms Strength training completed today  Goals Unmet:  Not Applicable  Comments: Pt able to follow exercise prescription today without complaint.  Will continue to monitor for progression.    Dr. Emily Filbert is Medical Director for [place] and [name].

## 2018-10-05 VITALS — Ht 68.9 in | Wt 144.3 lb

## 2018-10-05 DIAGNOSIS — I214 Non-ST elevation (NSTEMI) myocardial infarction: Secondary | ICD-10-CM | POA: Diagnosis not present

## 2018-10-05 DIAGNOSIS — Z955 Presence of coronary angioplasty implant and graft: Secondary | ICD-10-CM

## 2018-10-05 NOTE — Progress Notes (Signed)
Cardiac Individual Treatment Plan  Patient Details  Name: Thomas Romero MRN: [id number] Date of Birth: 1966-03-17 Referring Provider:     Cardiac Rehab from 08/15/2018 in [facility] Cardiac and Pulmonary Rehab  Referring Provider  Kathlyn Sacramento MD      Initial Encounter Date:    Cardiac Rehab from 08/15/2018 in [facility] Cardiac and Pulmonary Rehab  Date  08/15/18      Visit Diagnosis: NSTEMI (non-ST elevated myocardial infarction) [facility])  Status post coronary artery stent placement  Patient's Home Medications on Admission:  Current Outpatient Medications:  .  aspirin 81 MG chewable tablet, Chew 1 tablet (81 mg total) by mouth daily., Disp: 90 tablet, Rfl: 0 .  atorvastatin (LIPITOR) 80 MG tablet, Take 1 tablet (80 mg total) by mouth daily at 6 PM., Disp: 30 tablet, Rfl: 6 .  ezetimibe (ZETIA) 10 MG tablet, Take 1 tablet (10 mg total) by mouth daily., Disp: 90 tablet, Rfl: 3 .  nitroGLYCERIN (NITROSTAT) 0.4 MG SL tablet, Place 1 tablet (0.4 mg total) under the tongue every 5 (five) minutes as needed for chest pain., Disp: 25 tablet, Rfl: 1 .  pantoprazole (PROTONIX) 40 MG tablet, Take 1 tablet (40 mg total) by mouth daily., Disp: 30 tablet, Rfl: 6 .  ticagrelor (BRILINTA) 90 MG TABS tablet, Take 1 tablet (90 mg total) by mouth 2 (two) times daily., Disp: 60 tablet, Rfl: 6  Past Medical History: Past Medical History:  Diagnosis Date  . Coronary artery disease    Non-ST elevation myocardial infarction in November 2019.  Cardiac catheterization showed 99% stenosis in the mid RCA treated successfully with PCI and drug-eluting stent placement in moderate mid left circumflex disease.  . Gastric ulcer   . Hyperlipidemia   . Manic depression ([place])   . Panic attacks   . Tobacco use     Tobacco Use: Social History   Tobacco Use  Smoking Status Current Every Day Smoker  . Packs/day: 2.00  . Years: 36.00  . Pack years: 72.00  . Types: Cigarettes  Smokeless Tobacco Never Used  Tobacco  Comment   Patient 5 PPD; recently cut back to 1.5 daily    Labs: Recent Review Flowsheet Data    Labs for ITP Cardiac and Pulmonary Rehab Latest Ref Rng & Units 07/25/2018 09/28/2018   Cholestrol 100 - 199 mg/dL 203(H) 149   LDLCALC 0 - 99 mg/dL 152(H) 99   LDLDIRECT 0 - 99 mg/dL - 105(H)   HDL >39 mg/dL 24(L) 25(L)   Trlycerides 0 - 149 mg/dL 134 126       Exercise Target Goals: Exercise Program Goal: Individual exercise prescription set using results from initial 6 min walk test and THRR while considering  patient's activity barriers and safety.   Exercise Prescription Goal: Initial exercise prescription builds to 30-45 minutes a day of aerobic activity, 2-3 days per week.  Home exercise guidelines will be given to patient during program as part of exercise prescription that the participant will acknowledge.  Activity Barriers & Risk Stratification: Activity Barriers & Cardiac Risk Stratification - 08/15/18 1240      Activity Barriers & Cardiac Risk Stratification   Activity Barriers  Arthritis;Back Problems;Joint Problems;Deconditioning;Muscular Weakness   left knee injury from high school, still bothers him   Cardiac Risk Stratification  High       6 Minute Walk: 6 Minute Walk    Row Name 08/15/18 1251         6 Minute Walk   Phase  Initial     Distance  1300 feet     Walk Time  6 minutes     # of Rest Breaks  0     MPH  2.46     METS  4.43     RPE  11     VO2 Peak  15.5     Symptoms  No     Resting HR  61 bpm     Resting BP  134/60     Resting Oxygen Saturation   99 %     Exercise Oxygen Saturation  during 6 min walk  100 %     Max Ex. HR  100 bpm     Max Ex. BP  138/62     2 Minute Post BP  122/62        Oxygen Initial Assessment:   Oxygen Re-Evaluation:   Oxygen Discharge (Final Oxygen Re-Evaluation):   Initial Exercise Prescription: Initial Exercise Prescription - 08/15/18 1200      Date of Initial Exercise RX and Referring Provider    Date  08/15/18    Referring Provider  Kathlyn Sacramento MD      Treadmill   MPH  2.5    Grade  1    Minutes  15    METs  3.26      Recumbant Bike   Level  4    RPM  50    Watts  49    Minutes  15    METs  3.2      T5 Nustep   Level  4    SPM  80    Minutes  15    METs  3      Prescription Details   Frequency (times per week)  3    Duration  Progress to 45 minutes of aerobic exercise without signs/symptoms of physical distress      Intensity   THRR 40-80% of Max Heartrate  104-147    Ratings of Perceived Exertion  11-13    Perceived Dyspnea  0-4      Progression   Progression  Continue to progress workloads to maintain intensity without signs/symptoms of physical distress.      Resistance Training   Training Prescription  Yes    Weight  4 lbs    Reps  10-15       Perform Capillary Blood Glucose checks as needed.  Exercise Prescription Changes:  Exercise Prescription Changes    Row Name 08/15/18 1200 08/24/18 0900 08/29/18 1000 09/14/18 1500 09/28/18 1400     Response to Exercise   Blood Pressure (Admit)  134/60  -  128/72  122/66  104/60   Blood Pressure (Exercise)  138/62  -  128/74  138/76  164/70   Blood Pressure (Exit)  122/62  -  132/70  98/58  108/62   Heart Rate (Admit)  61 bpm  -  75 bpm  88 bpm  72 bpm   Heart Rate (Exercise)  100 bpm  -  140 bpm  116 bpm  130 bpm   Heart Rate (Exit)  65 bpm  -  82 bpm  78 bpm  83 bpm   Oxygen Saturation (Admit)  99 %  -  -  -  -   Oxygen Saturation (Exercise)  100 %  -  -  -  -   Rating of Perceived Exertion (Exercise)  11  -  12  12  13    Symptoms  none  -  none  none  none   Comments  walk test results  -  -  -  -   Duration  -  -  Continue with 45 min of aerobic exercise without signs/symptoms of physical distress.  Continue with 45 min of aerobic exercise without signs/symptoms of physical distress.  Continue with 45 min of aerobic exercise without signs/symptoms of physical distress.   Intensity  -  -  THRR  unchanged  THRR unchanged  THRR unchanged     Progression   Progression  -  -  Continue to progress workloads to maintain intensity without signs/symptoms of physical distress.  Continue to progress workloads to maintain intensity without signs/symptoms of physical distress.  Continue to progress workloads to maintain intensity without signs/symptoms of physical distress.   Average METs  -  -  3.19  2.88  3.31     Resistance Training   Training Prescription  -  -  Yes  Yes  Yes   Weight  -  -  4 lbs  4 lbs  4 lbs   Reps  -  -  10-15  10-15  10-15     Interval Training   Interval Training  -  -  No  No  No     Treadmill   MPH  -  -  2.5  2.5  3   Grade  -  -  1  1  2    Minutes  -  -  15  15  15    METs  -  -  3.26  3.26  4.12     Recumbant Bike   Level  -  -  4  -  -   Minutes  -  -  15  -  -   METs  -  -  4.1  -  -     Elliptical   Level  -  -  -  1  8   Speed  -  -  -  3  3   Minutes  -  -  -  15  15     T5 Nustep   Level  -  -  4  4  6    Minutes  -  -  15  15  15    METs  -  -  2.2  2.5  2.5     Home Exercise Plan   Plans to continue exercise at  -  Home (comment) 2 days a week  Home (comment) 2 days a week  Home (comment) 2 days a week  Home (comment) 2 days a week   Frequency  -  Add 2 additional days to program exercise sessions.  Add 2 additional days to program exercise sessions.  Add 2 additional days to program exercise sessions.  Add 2 additional days to program exercise sessions.   Initial Home Exercises Provided  -  08/24/18  08/24/18  08/24/18  08/24/18      Exercise Comments:  Exercise Comments    Row Name 08/19/18 1020           Exercise Comments  First full day of exercise!  Patient was oriented to gym and equipment including functions, settings, policies, and procedures.  Patient's individual exercise prescription and treatment plan were reviewed.  All starting workloads were established based on the results of the 6 minute walk test done at initial  orientation visit.  The plan for exercise progression  was also introduced and progression will be customized based on patient's performance and goals.          Exercise Goals and Review:  Exercise Goals    Row Name 08/15/18 1254             Exercise Goals   Increase Physical Activity  Yes       Intervention  Provide advice, education, support and counseling about physical activity/exercise needs.;Develop an individualized exercise prescription for aerobic and resistive training based on initial evaluation findings, risk stratification, comorbidities and participant's personal goals.       Expected Outcomes  Short Term: Attend rehab on a regular basis to increase amount of physical activity.;Long Term: Add in home exercise to make exercise part of routine and to increase amount of physical activity.;Long Term: Exercising regularly at least 3-5 days a week.       Increase Strength and Stamina  Yes       Intervention  Develop an individualized exercise prescription for aerobic and resistive training based on initial evaluation findings, risk stratification, comorbidities and participant's personal goals.;Provide advice, education, support and counseling about physical activity/exercise needs.       Expected Outcomes  Short Term: Increase workloads from initial exercise prescription for resistance, speed, and METs.;Short Term: Perform resistance training exercises routinely during rehab and add in resistance training at home;Long Term: Improve cardiorespiratory fitness, muscular endurance and strength as measured by increased METs and functional capacity (6MWT)       Able to understand and use rate of perceived exertion (RPE) scale  Yes       Intervention  Provide education and explanation on how to use RPE scale       Expected Outcomes  Short Term: Able to use RPE daily in rehab to express subjective intensity level;Long Term:  Able to use RPE to guide intensity level when exercising independently        Knowledge and understanding of Target Heart Rate Range (THRR)  Yes       Intervention  Provide education and explanation of THRR including how the numbers were predicted and where they are located for reference       Expected Outcomes  Short Term: Able to state/look up THRR;Short Term: Able to use daily as guideline for intensity in rehab;Long Term: Able to use THRR to govern intensity when exercising independently       Able to check pulse independently  Yes       Intervention  Provide education and demonstration on how to check pulse in carotid and radial arteries.;Review the importance of being able to check your own pulse for safety during independent exercise       Expected Outcomes  Short Term: Able to explain why pulse checking is important during independent exercise;Long Term: Able to check pulse independently and accurately       Understanding of Exercise Prescription  Yes       Intervention  Provide education, explanation, and written materials on patient's individual exercise prescription       Expected Outcomes  Short Term: Able to explain program exercise prescription;Long Term: Able to explain home exercise prescription to exercise independently          Exercise Goals Re-Evaluation : Exercise Goals Re-Evaluation    Row Name 08/24/18 0909 08/29/18 1021 09/14/18 1502 09/16/18 0835 09/28/18 1419     Exercise Goal Re-Evaluation   Exercise Goals Review  Increase Physical Activity;Increase Strength and Stamina;Able to understand and use  rate of perceived exertion (RPE) scale;Able to understand and use Dyspnea scale;Knowledge and understanding of Target Heart Rate Range (THRR);Able to check pulse independently;Understanding of Exercise Prescription  Increase Physical Activity;Increase Strength and Stamina;Understanding of Exercise Prescription  Increase Physical Activity;Increase Strength and Stamina;Understanding of Exercise Prescription  Increase Physical Activity;Increase Strength  and Stamina;Understanding of Exercise Prescription  Increase Physical Activity;Increase Strength and Stamina;Understanding of Exercise Prescription   Comments  Reviewed home exercise with pt today.  Pt plans to walk at home an extra two days a week for exercise.  Reviewed THR, pulse, RPE, sign and symptoms, NTG use, and when to call 911 or MD.  Also discussed weather considerations and indoor options.  Pt voiced understanding.  Koston is off to a good start in rehab.  He is enjoying it more than he thought he would.  So far, he is tolerating the group atmosphere okay.  He is ready to start to increase his workloads.  We will continue to monitor his progress.   Jahree continues to do well in rehab. He is enjoying coming to class.  He is only level 4 of the NuStep at 2.5 METs.  We will continue to monitor his progression.   Mancil has been doing well in rehab.  He has been doing some of his walking at home as the weather allows.  He is feeling like his strength and stamina are getting better overall.   Mort continues to do well in rehab.  He is now up to level 6 on the T5 and level 8 on the elliptical.  We talked about adding in intervals to the treadmill as he is good at 3.0 mph.  We will continue to monitor his progress.    Expected Outcomes  Short: add two extra days of walking at home. Long: maintain home exercise independently  Short: Begin to increase workloads.  Long: Continue to exercise on his own at home.   Short: Increase treadmill  Long: Continue to improve strength and stamina.   Short: Continue to try to walk at home.  Long: Continue to improve strength and stamina.   Short: Try intervals on treadmill.  Long: Continue to exercise more      Discharge Exercise Prescription (Final Exercise Prescription Changes): Exercise Prescription Changes - 09/28/18 1400      Response to Exercise   Blood Pressure (Admit)  104/60    Blood Pressure (Exercise)  164/70    Blood Pressure (Exit)  108/62    Heart  Rate (Admit)  72 bpm    Heart Rate (Exercise)  130 bpm    Heart Rate (Exit)  83 bpm    Rating of Perceived Exertion (Exercise)  13    Symptoms  none    Duration  Continue with 45 min of aerobic exercise without signs/symptoms of physical distress.    Intensity  THRR unchanged      Progression   Progression  Continue to progress workloads to maintain intensity without signs/symptoms of physical distress.    Average METs  3.31      Resistance Training   Training Prescription  Yes    Weight  4 lbs    Reps  10-15      Interval Training   Interval Training  No      Treadmill   MPH  3    Grade  2    Minutes  15    METs  4.12      Elliptical   Level  8  Speed  3    Minutes  15      T5 Nustep   Level  6    Minutes  15    METs  2.5      Home Exercise Plan   Plans to continue exercise at  Home (comment)   2 days a week   Frequency  Add 2 additional days to program exercise sessions.    Initial Home Exercises Provided  08/24/18       Nutrition:  Target Goals: Understanding of nutrition guidelines, daily intake of sodium <1560m, cholesterol <2076m calories 30% from fat and 7% or less from saturated fats, daily to have 5 or more servings of fruits and vegetables.  Biometrics: Pre Biometrics - 08/15/18 1254      Pre Biometrics   Height  5' 8.9" (1.75 m)    Weight  140 lb 9.6 oz (63.8 kg)    Waist Circumference  32.5 inches    Hip Circumference  35.5 inches    Waist to Hip Ratio  0.92 %    BMI (Calculated)  20.82    Single Leg Stand  22.56 seconds        Nutrition Therapy Plan and Nutrition Goals: Nutrition Therapy & Goals - 08/22/18 0926      Nutrition Therapy   Diet  TLC    Drug/Food Interactions  Statins/Certain Fruits    Protein (specify units)  8-9oz    Fiber  30 grams    Whole Grain Foods  3 servings   does not eat grains regularly but will occasionally choose whole grain bread for sandwhiches   Saturated Fats  15 max. grams    Fruits and Vegetables   4 servings/day   does not eat fruits or vegetables regularly   Sodium  1500 grams      Personal Nutrition Goals   Nutrition Goal  Eat a meal or snack during the first part of the day at least one day per week consistently.    Personal Goal #2  Continue to work on drinking water in place of [place] and Sweet tea    Personal Goal #3  Eat one additional serving of fruit or vegetables each week    Comments  He typically eats 1 meal / day, sometimes 2, and does not typcially eat fruit d/t disliking it. Eats green beans and corn for vegetables and does not usually eat grains. He drinks very little water; drinks mostly [place] Dew and sweet tea. He has trouble gaining wt per his report. Eats mostly meat which he bakes in the oven. Uses salt "depending on how he's preparing the meat."      Intervention Plan   Intervention  Prescribe, educate and counsel regarding individualized specific dietary modifications aiming towards targeted core components such as weight, hypertension, lipid management, diabetes, heart failure and other comorbidities.    Expected Outcomes  Short Term Goal: Understand basic principles of dietary content, such as calories, fat, sodium, cholesterol and nutrients.;Short Term Goal: A plan has been developed with personal nutrition goals set during dietitian appointment.;Long Term Goal: Adherence to prescribed nutrition plan.       Nutrition Assessments: Nutrition Assessments - 10/03/18 0757      MEDFICTS Scores   Pre Score  65    Post Score  51    Score Difference  -14       Nutrition Goals Re-Evaluation: Nutrition Goals Re-Evaluation    Ro[place]ame 08/22/18 [phone]  Goals   Nutrition Goal  Eat a meal or snack during the first part of the day at least one day per week consistently.       Comment  He usually only eats dinner but states he has trouble gaining weight. He has never been a breakfast eater and is not willing to start       Expected Outcome  He will eat a  small meal or snack at lunch time at least one day/week. Long term goal: eat 2 meals/day consistently         Personal Goal #2 Re-Evaluation   Personal Goal #2  Continue to work on drinking water in place of [place]. Dew and Sweet tea         Personal Goal #3 Re-Evaluation   Personal Goal #3  Eat one additional serving of fruit or vegetables each week          Nutrition Goals Discharge (Final Nutrition Goals Re-Evaluation): Nutrition Goals Re-Evaluation - 08/22/18 0947      Goals   Nutrition Goal  Eat a meal or snack during the first part of the day at least one day per week consistently.    Comment  He usually only eats dinner but states he has trouble gaining weight. He has never been a breakfast eater and is not willing to start    Expected Outcome  He will eat a small meal or snack at lunch time at least one day/week. Long term goal: eat 2 meals/day consistently      Personal Goal #2 Re-Evaluation   Personal Goal #2  Continue to work on drinking water in place of [place]. Dew and Sweet tea      Personal Goal #3 Re-Evaluation   Personal Goal #3  Eat one additional serving of fruit or vegetables each week       Psychosocial: Target Goals: Acknowledge presence or absence of significant depression and/or stress, maximize coping skills, provide positive support system. Participant is able to verbalize types and ability to use techniques and skills needed for reducing stress and depression.   Initial Review & Psychosocial Screening: Initial Psych Review & Screening - 08/15/18 1242      Initial Review   Current issues with  History of Depression;Current Anxiety/Panic;Current Sleep Concerns;Current Stress Concerns    Source of Stress Concerns  Poor Coping Skills;Retirement/disability    Comments  Lyall is on disability related to mental health. He reports anxiety, manic depression, history of panic attacks. He stopped taking all medications related to these issues in November 14, 2003 once both his parents  died. He says he knows how to cope when he feels an "episode" coming on. usually he just leaves the situation (left AMA from hospital after stent because he wanted a cig). He dropped out of high school at 16 and taught himself how to read and write after he dropped out of school. He doesn't do much during the day due to back pain (which he does not take anything for).       Family Dynamics   Good Support System?  Yes   wife     Barriers   Psychosocial barriers to participate in program  The patient should benefit from training in stress management and relaxation.      Screening Interventions   Interventions  Encouraged to exercise;Program counselor consult;To provide support and resources with identified psychosocial needs;Provide feedback about the scores to participant    Expected Outcomes  Short Term goal: Utilizing psychosocial counselor,  staff and physician to assist with identification of specific Stressors or current issues interfering with healing process. Setting desired goal for each stressor or current issue identified.;Long Term Goal: Stressors or current issues are controlled or eliminated.;Short Term goal: Identification and review with participant of any Quality of Life or Depression concerns found by scoring the questionnaire.;Long Term goal: The participant improves quality of Life and PHQ9 Scores as seen by post scores and/or verbalization of changes       Quality of Life Scores:  Quality of Life - 10/03/18 0757      Quality of Life   Select  Quality of Life      Quality of Life Scores   Health/Function Pre  21.47 %    Health/Function Post  24 %    Health/Function % Change  11.78 %    Socioeconomic Pre  21.64 %    Socioeconomic Post  21.92 %    Socioeconomic % Change   1.29 %    Psych/Spiritual Pre  20.5 %    Psych/Spiritual Post  28.5 %    Psych/Spiritual % Change  39.02 %    Family Pre  25.2 %    Family Post  27.6 %    Family % Change  9.52 %    GLOBAL Pre  21.89  %    GLOBAL Post  24.93 %    GLOBAL % Change  13.89 %      Scores of 19 and below usually indicate a poorer quality of life in these areas.  A difference of  2-3 points is a clinically meaningful difference.  A difference of 2-3 points in the total score of the Quality of Life Index has been associated with significant improvement in overall quality of life, self-image, physical symptoms, and general health in studies assessing change in quality of life.  PHQ-9: Recent Review Flowsheet Data    Depression screen [facility] 2/9 10/03/2018 08/15/2018   Decreased Interest 0 0   Down, Depressed, Hopeless 0 1   PHQ - 2 Score 0 1   Altered sleeping 1 2   Tired, decreased energy 0 1   Change in appetite 0 0   Feeling bad or failure about yourself  0 1   Trouble concentrating 0 0   Moving slowly or fidgety/restless 0 0   Suicidal thoughts 0 0   PHQ-9 Score 1 5   Difficult doing work/chores Not difficult at all Not difficult at all     Interpretation of Total Score  Total Score Depression Severity:  1-4 = Minimal depression, 5-9 = Mild depression, 10-14 = Moderate depression, 15-19 = Moderately severe depression, 20-27 = Severe depression   Psychosocial Evaluation and Intervention: Psychosocial Evaluation - 09/05/18 0920      Psychosocial Evaluation & Interventions   Interventions  Stress management education;Relaxation education;Encouraged to exercise with the program and follow exercise prescription    Comments  Counselor met with Mr. Driskill Navarette) today for initial psychosocial evaluation.  He is a 53 year old who had a heart attack and a stent inserted in November.  He has a strong support system with a spouse; her children and his sister all living in the same home.  He has a son who lives in [place] that is also very supportive.  Damani reports being healthy other than his heart and "mental health issues."  He admits to chronic sleep problem with maybe 2-3 hours of sleep nightly. He also admits to  being diagnosed with Bipolar  in 1998 and has refused medications since 2005.  He also reports having psychosis daily and he just "ignores it."  Jovane reports having an "okay" appetite and typically being in a positive mood.  He states that his primary stressors are relationship conflicts with adult "steps" who live in the same home.  He also has some financial concerns with being on disability since 1998 due to his mental health condition.  He denies current homicidal or suicidal ideations at this time.  Counselor recommended a sleep study to [place] since he reports his sleeping problems have been ongoing since he was a child.  he states that he is "claustrophobic" and would not wear a mask if he was prescribed one so will likely not comply with this recommendation.  Rawad has a goal for this program to be heart healthier.  He will be followed by staff.      Expected Outcomes  Short:  Carsin is encouraged to exercise consistently for his health and mental health.  He is encouraged to check with his Dr. about a sleep study - but is reluctant to do so with his mental health issues.  Long:  Labrandon will continue to take his medications for his heart and abide by his Dr's orders concerning exercise and diet.      Continue Psychosocial Services   Follow up required by staff       Psychosocial Re-Evaluation: Psychosocial Re-Evaluation    [place] Name 09/16/18 0840 09/28/18 0900           Psychosocial Re-Evaluation   Current issues with  Current Psychotropic Meds;Current Sleep Concerns;Current Stress Concerns;Current Anxiety/Panic  Current Sleep Concerns;Current Stress Concerns;Current Anxiety/Panic;History of Depression      Comments  Rasean has been doing well in rehab.  He is still smoking as it helps keep him sane and level headed.  He has been able to tolerate the group setting better than he thought he could.    He feels pretty good mentally.  He has been able to control his panic attacks and keep from  feeling too crowded in.    He is able to notice that he is working out and feeling better.   Counselor follow with Julious today reporting his stress has decreased as he has moved into an outbuilding to get away from his adult step children in the main home and this is working well for him.  His spouse has joined him out there and other than running water they have everything they need.  He continues to sleep only ~2 hours per night and continues to stress that he will not have a sleep study nor treatment for this issue.  He reports feeling stronger physically since coming into this program.  His mental health concerns remain stable with him "handling it" by ignoring any psychosis and states this is working for him.  Tylerjames continues to smoke and that is his primary coping strategy as well as isolating himself and avoiding stress.  He continues to have financial problems but is able to make it from disability check to disability check.  He has done surprisingly well in this program in a group setting considering all of these concerns.  Staff will continue to follow with him.      Expected Outcomes  Short: Continue to attend classes regularly.   Long: Continue to cope with anxiety positively.   Short:  Keaten will continue to exercise consistently to increase his stamina and strength and cope with stress in  his life.  Long:  Grantley would benefit from a sleep study and treatment - but he refuses - so continued exercise will be beneficial for his health and mental health.       Interventions  Stress management education;Encouraged to attend Cardiac Rehabilitation for the exercise  -      Continue Psychosocial Services   Follow up required by staff  Follow up required by staff        Initial Review   Source of Stress Concerns  Poor Coping Skills;Retirement/disability  -         Psychosocial Discharge (Final Psychosocial Re-Evaluation): Psychosocial Re-Evaluation - 09/28/18 0900      Psychosocial  Re-Evaluation   Current issues with  Current Sleep Concerns;Current Stress Concerns;Current Anxiety/Panic;History of Depression    Comments  Counselor follow with Maejor today reporting his stress has decreased as he has moved into an outbuilding to get away from his adult step children in the main home and this is working well for him.  His spouse has joined him out there and other than running water they have everything they need.  He continues to sleep only ~2 hours per night and continues to stress that he will not have a sleep study nor treatment for this issue.  He reports feeling stronger physically since coming into this program.  His mental health concerns remain stable with him "handling it" by ignoring any psychosis and states this is working for him.  Larnce continues to smoke and that is his primary coping strategy as well as isolating himself and avoiding stress.  He continues to have financial problems but is able to make it from disability check to disability check.  He has done surprisingly well in this program in a group setting considering all of these concerns.  Staff will continue to follow with him.    Expected Outcomes  Short:  Marian will continue to exercise consistently to increase his stamina and strength and cope with stress in his life.  Long:  Antwine would benefit from a sleep study and treatment - but he refuses - so continued exercise will be beneficial for his health and mental health.     Continue Psychosocial Services   Follow up required by staff       Vocational Rehabilitation: Provide vocational rehab assistance to qualifying candidates.   Vocational Rehab Evaluation & Intervention: Vocational Rehab - 08/15/18 1242      Initial Vocational Rehab Evaluation & Intervention   Assessment shows need for Vocational Rehabilitation  No       Education: Education Goals: Education classes will be provided on a variety of topics geared toward better understanding of  heart health and risk factor modification. Participant will state understanding/return demonstration of topics presented as noted by education test scores.  Learning Barriers/Preferences: Learning Barriers/Preferences - 08/15/18 1242      Learning Barriers/Preferences   Learning Barriers  Reading    Learning Preferences  None       Education Topics:  AED/CPR: - Group verbal and written instruction with the use of models to demonstrate the basic use of the AED with the basic ABC's of resuscitation.   Cardiac Rehab from 10/05/2018 in [facility] Cardiac and Pulmonary Rehab  Date  09/05/18  Educator  CE  Instruction Review Code  1- Verbalizes Understanding      General Nutrition Guidelines/Fats and Fiber: -Group instruction provided by verbal, written material, models and posters to present the general guidelines for heart healthy nutrition.  Gives an explanation and review of dietary fats and fiber.   Cardiac Rehab from 10/05/2018 in [facility] Cardiac and Pulmonary Rehab  Date  09/19/18  Educator  LB  Instruction Review Code  1- Verbalizes Understanding      Controlling Sodium/Reading Food Labels: -Group verbal and written material supporting the discussion of sodium use in heart healthy nutrition. Review and explanation with models, verbal and written materials for utilization of the food label.   Cardiac Rehab from 10/05/2018 in [facility] Cardiac and Pulmonary Rehab  Date  09/21/18  Educator  LB  Instruction Review Code  1- Verbalizes Understanding      Exercise Physiology & General Exercise Guidelines: - Group verbal and written instruction with models to review the exercise physiology of the cardiovascular system and associated critical values. Provides general exercise guidelines with specific guidelines to those with heart or lung disease.    Cardiac Rehab from 10/05/2018 in [facility] Cardiac and Pulmonary Rehab  Date  09/26/18  Educator  [facility]  Instruction Review Code  1- Verbalizes Understanding       Aerobic Exercise & Resistance Training: - Gives group verbal and written instruction on the various components of exercise. Focuses on aerobic and resistive training programs and the benefits of this training and how to safely progress through these programs..   Cardiac Rehab from 10/05/2018 in [facility] Cardiac and Pulmonary Rehab  Date  09/28/18  Educator  [facility]  Instruction Review Code  1- Verbalizes Understanding      Flexibility, Balance, Mind/Body Relaxation: Provides group verbal/written instruction on the benefits of flexibility and balance training, including mind/body exercise modes such as yoga, pilates and tai chi.  Demonstration and skill practice provided.   Cardiac Rehab from 10/05/2018 in [facility] Cardiac and Pulmonary Rehab  Date  10/03/18  Educator  [facility]  Instruction Review Code  1- Verbalizes Understanding      Stress and Anxiety: - Provides group verbal and written instruction about the health risks of elevated stress and causes of high stress.  Discuss the correlation between heart/lung disease and anxiety and treatment options. Review healthy ways to manage with stress and anxiety.   Depression: - Provides group verbal and written instruction on the correlation between heart/lung disease and depressed mood, treatment options, and the stigmas associated with seeking treatment.   Cardiac Rehab from 10/05/2018 in [facility] Cardiac and Pulmonary Rehab  Date  09/14/18  Educator  [facility]  Instruction Review Code  1- Verbalizes Understanding      Anatomy & Physiology of the Heart: - Group verbal and written instruction and models provide basic cardiac anatomy and physiology, with the coronary electrical and arterial systems. Review of Valvular disease and Heart Failure   Cardiac Procedures: - Group verbal and written instruction to review commonly prescribed medications for heart disease. Reviews the medication, class of the drug, and side effects. Includes the steps to properly  store meds and maintain the prescription regimen. (beta blockers and nitrates)   Cardiac Medications I: - Group verbal and written instruction to review commonly prescribed medications for heart disease. Reviews the medication, class of the drug, and side effects. Includes the steps to properly store meds and maintain the prescription regimen.   Cardiac Rehab from 10/05/2018 in [facility] Cardiac and Pulmonary Rehab  Date  08/22/18  Educator  CE  Instruction Review Code  1- Verbalizes Understanding      Cardiac Medications II: -Group verbal and written instruction to review commonly prescribed medications for heart disease. Reviews the medication,  class of the drug, and side effects. (all other drug classes)   Cardiac Rehab from 10/05/2018 in [facility] Cardiac and Pulmonary Rehab  Date  10/05/18  Educator  [facility]  Instruction Review Code  1- Verbalizes Understanding       Go Sex-Intimacy & Heart Disease, Get SMART - Goal Setting: - Group verbal and written instruction through game format to discuss heart disease and the return to sexual intimacy. Provides group verbal and written material to discuss and apply goal setting through the application of the S.M.A.R.T. Method.   Other Matters of the Heart: - Provides group verbal, written materials and models to describe Stable Angina and Peripheral Artery. Includes description of the disease process and treatment options available to the cardiac patient.   Exercise & Equipment Safety: - Individual verbal instruction and demonstration of equipment use and safety with use of the equipment.   Cardiac Rehab from 10/05/2018 in [facility] Cardiac and Pulmonary Rehab  Date  08/15/18  Educator  [facility]  Instruction Review Code  1- Verbalizes Understanding      Infection Prevention: - Provides verbal and written material to individual with discussion of infection control including proper hand washing and proper equipment cleaning during exercise session.   Cardiac  Rehab from 10/05/2018 in [facility] Cardiac and Pulmonary Rehab  Date  08/15/18  Educator  [facility]  Instruction Review Code  1- Verbalizes Understanding      Falls Prevention: - Provides verbal and written material to individual with discussion of falls prevention and safety.   Cardiac Rehab from 10/05/2018 in [facility] Cardiac and Pulmonary Rehab  Date  08/15/18  Educator  [facility]  Instruction Review Code  1- Verbalizes Understanding      Diabetes: - Individual verbal and written instruction to review signs/symptoms of diabetes, desired ranges of glucose level fasting, after meals and with exercise. Acknowledge that pre and post exercise glucose checks will be done for 3 sessions at entry of program.   Know Your Numbers and Risk Factors: -Group verbal and written instruction about important numbers in your health.  Discussion of what are risk factors and how they play a role in the disease process.  Review of Cholesterol, Blood Pressure, Diabetes, and BMI and the role they play in your overall health.   Cardiac Rehab from 10/05/2018 in [facility] Cardiac and Pulmonary Rehab  Date  10/05/18  Educator  [facility]  Instruction Review Code  1- Verbalizes Understanding      Sleep Hygiene: -Provides group verbal and written instruction about how sleep can affect your health.  Define sleep hygiene, discuss sleep cycles and impact of sleep habits. Review good sleep hygiene tips.    Other: -Provides group and verbal instruction on various topics (see comments)   Knowledge Questionnaire Score: Knowledge Questionnaire Score - 10/03/18 0758      Knowledge Questionnaire Score   Pre Score  16/26    Post Score  21/26   test reviewed with pt today      Core Components/Risk Factors/Patient Goals at Admission: Personal Goals and Risk Factors at Admission - 08/15/18 1241      Core Components/Risk Factors/Patient Goals on Admission    Weight Management  Yes;Weight Maintenance    Intervention  Weight Management: Develop a  combined nutrition and exercise program designed to reach desired caloric intake, while maintaining appropriate intake of nutrient and fiber, sodium and fats, and appropriate energy expenditure required for the weight goal.;Weight Management: Provide education and appropriate resources to help participant work on and  attain dietary goals.    Admit Weight  140 lb 9.6 oz (63.8 kg)    Expected Outcomes  Short Term: Continue to assess and modify interventions until short term weight is achieved;Long Term: Adherence to nutrition and physical activity/exercise program aimed toward attainment of established weight goal;Weight Maintenance: Understanding of the daily nutrition guidelines, which includes 25-35% calories from fat, 7% or less cal from saturated fats, less than 234m cholesterol, less than 1.5gm of sodium, & 5 or more servings of fruits and vegetables daily;Understanding recommendations for meals to include 15-35% energy as protein, 25-35% energy from fat, 35-60% energy from carbohydrates, less than 2038mof dietary cholesterol, 20-35 gm of total fiber daily;Understanding of distribution of calorie intake throughout the day with the consumption of 4-5 meals/snacks    Tobacco Cessation  Yes    Number of packs per day  1.5   patient stated he has smoked for the past 45 years and does not plan to quit   Intervention  Assist the participant in steps to quit. Provide individualized education and counseling about committing to Tobacco Cessation, relapse prevention, and pharmacological support that can be provided by physician.;OfAdvice workerassist with locating and accessing local/national Quit Smoking programs, and support quit date choice.    Expected Outcomes  Short Term: Will demonstrate readiness to quit, by selecting a quit date.;Short Term: Will quit all tobacco product use, adhering to prevention of relapse plan.;Long Term: Complete abstinence from all tobacco products for at least 12  months from quit date.    Hypertension  Yes    Intervention  Monitor prescription use compliance.;Provide education on lifestyle modifcations including regular physical activity/exercise, weight management, moderate sodium restriction and increased consumption of fresh fruit, vegetables, and low fat dairy, alcohol moderation, and smoking cessation.    Expected Outcomes  Short Term: Continued assessment and intervention until BP is < 140/9042mG in hypertensive participants. < 130/41m53m in hypertensive participants with diabetes, heart failure or chronic kidney disease.;Long Term: Maintenance of blood pressure at goal levels.       Core Components/Risk Factors/Patient Goals Review:  Goals and Risk Factor Review    Row Name 09/16/18 0836             Core Components/Risk Factors/Patient Goals Review   Personal Goals Review  Weight Management/Obesity;Tobacco Cessation;Hypertension       Review  Ghazi's weight has been staying steady around 140-147 lbs which is his normal.  He is still smoking and does not wish to quit as it helps control his anxiety and keeps him controlled.  He has ordered a new blood pressure cuff and scale for free to track his pressure  and weight at home.  We talked about the improtance of recording it.         Expected Outcomes  Short: Start checking and recording pressures when he gets his new cuff.  Long: Continue to monitor his risk factors.           Core Components/Risk Factors/Patient Goals at Discharge (Final Review):  Goals and Risk Factor Review - 09/16/18 0836      Core Components/Risk Factors/Patient Goals Review   Personal Goals Review  Weight Management/Obesity;Tobacco Cessation;Hypertension    Review  Angeles's weight has been staying steady around 140-147 lbs which is his normal.  He is still smoking and does not wish to quit as it helps control his anxiety and keeps him controlled.  He has ordered a new blood pressure cuff and scale for  free to track  his pressure  and weight at home.  We talked about the improtance of recording it.      Expected Outcomes  Short: Start checking and recording pressures when he gets his new cuff.  Long: Continue to monitor his risk factors.        ITP Comments: ITP Comments    Row Name 08/15/18 1237 09/06/18 0614 10/05/18 0904       ITP Comments  Med Review completed. Initial ITP created. Diagnosis can be found in [facility] 11/17  30 Day Review. Continue with ITP unless directed changes per Medical Director review New to program  30 Day Review. Continue with ITP unless directed changes per Medical Director review.        Comments: 30 day review

## 2018-10-05 NOTE — Progress Notes (Signed)
Daily Session Note  Patient Details  Name: Thomas Romero MRN: [id number] Date of Birth: Oct 16, 1965 Referring Provider:     Cardiac Rehab from 08/15/2018 in [facility] Cardiac and Pulmonary Rehab  Referring Provider  Kathlyn Sacramento MD      Encounter Date: 10/05/2018  Check In: Session Check In - 10/05/18 0742      Check-In   Supervising physician immediately available to respond to emergencies  See telemetry face sheet for immediately available ER MD    Location  [facility]-Cardiac & Pulmonary Rehab    Staff Present  Alberteen Sam, MA, RCEP, CCRP, Exercise Physiologist;Leslie Castrejon RN, BSN;Jevan Gaunt RCP,RRT,BSRT    Medication changes reported      No    Fall or balance concerns reported     No    Warm-up and Cool-down  Performed as group-led Higher education careers adviser Performed  Yes    VAD Patient?  No    PAD/SET Patient?  No      Pain Assessment   Currently in Pain?  No/denies          Social History   Tobacco Use  Smoking Status Current Every Day Smoker  . Packs/day: 2.00  . Years: 36.00  . Pack years: 72.00  . Types: Cigarettes  Smokeless Tobacco Never Used  Tobacco Comment   Patient 5 PPD; recently cut back to 1.5 daily    Goals Met:  Independence with exercise equipment Exercise tolerated well No report of cardiac concerns or symptoms Strength training completed today  Goals Unmet:  Not Applicable  Comments: Pt able to follow exercise prescription today without complaint.  Will continue to monitor for progression.    Dr. Emily Filbert is Medical Director for [facility] and [name].

## 2018-10-07 ENCOUNTER — Encounter: Payer: Medicare HMO | Admitting: *Deleted

## 2018-10-07 DIAGNOSIS — I214 Non-ST elevation (NSTEMI) myocardial infarction: Secondary | ICD-10-CM | POA: Diagnosis not present

## 2018-10-07 DIAGNOSIS — Z955 Presence of coronary angioplasty implant and graft: Secondary | ICD-10-CM

## 2018-10-07 NOTE — Progress Notes (Signed)
Daily Session Note  Patient Details  Name: Thomas Romero MRN: [id number] Date of Birth: 1965-10-26 Referring Provider:     Cardiac Rehab from 08/15/2018 in [facility] Cardiac and Pulmonary Rehab  Referring Provider  Kathlyn Sacramento MD      Encounter Date: 10/07/2018  Check In: Session Check In - 10/07/18 0747      Check-In   Supervising physician immediately available to respond to emergencies  See telemetry face sheet for immediately available ER MD    Location  [facility]-Cardiac & Pulmonary Rehab    Staff Present  Alberteen Sam, MA, RCEP, CCRP, Exercise Physiologist;Amanda Oletta Darter, BA, ACSM CEP, Exercise Physiologist;Meredith Sherryll Burger, RN BSN    Medication changes reported      No    Fall or balance concerns reported     No    Warm-up and Cool-down  Performed as group-led Higher education careers adviser Performed  Yes    VAD Patient?  No    PAD/SET Patient?  No      Pain Assessment   Currently in Pain?  No/denies          Social History   Tobacco Use  Smoking Status Current Every Day Smoker  . Packs/day: 2.00  . Years: 36.00  . Pack years: 72.00  . Types: Cigarettes  Smokeless Tobacco Never Used  Tobacco Comment   Patient 5 PPD; recently cut back to 1.5 daily    Goals Met:  Independence with exercise equipment Exercise tolerated well No report of cardiac concerns or symptoms Strength training completed today  Goals Unmet:  Not Applicable  Comments: Pt able to follow exercise prescription today without complaint.  Will continue to monitor for progression.    Dr. Emily Filbert is Medical Director for [place] and [name].

## 2018-10-10 ENCOUNTER — Encounter: Payer: Medicare HMO | Attending: Cardiovascular Disease | Admitting: *Deleted

## 2018-10-10 DIAGNOSIS — I2510 Atherosclerotic heart disease of native coronary artery without angina pectoris: Secondary | ICD-10-CM | POA: Diagnosis not present

## 2018-10-10 DIAGNOSIS — F410 Panic disorder [episodic paroxysmal anxiety] without agoraphobia: Secondary | ICD-10-CM | POA: Insufficient documentation

## 2018-10-10 DIAGNOSIS — I214 Non-ST elevation (NSTEMI) myocardial infarction: Secondary | ICD-10-CM | POA: Diagnosis not present

## 2018-10-10 DIAGNOSIS — F17210 Nicotine dependence, cigarettes, uncomplicated: Secondary | ICD-10-CM | POA: Diagnosis not present

## 2018-10-10 DIAGNOSIS — Z955 Presence of coronary angioplasty implant and graft: Secondary | ICD-10-CM

## 2018-10-10 DIAGNOSIS — E785 Hyperlipidemia, unspecified: Secondary | ICD-10-CM | POA: Diagnosis not present

## 2018-10-10 DIAGNOSIS — F339 Major depressive disorder, recurrent, unspecified: Secondary | ICD-10-CM | POA: Insufficient documentation

## 2018-10-10 NOTE — Progress Notes (Signed)
Daily Session Note  Patient Details  Name: Thomas Romero MRN: [id number] Date of Birth: [birth year] Referring Provider:     Cardiac Rehab from 08/15/2018 in [facility] Cardiac and Pulmonary Rehab  Referring Provider  Kathlyn Sacramento MD      Encounter Date: 10/10/2018  Check In: Session Check In - 10/10/18 0741      Check-In   Supervising physician immediately available to respond to emergencies  See telemetry face sheet for immediately available ER MD    Location  [facility]-Cardiac & Pulmonary Rehab    Staff Present  Earlean Shawl, BS, ACSM CEP, Exercise Physiologist;Susanne Bice, RN, BSN, CCRP;Jessica [place], MA, RCEP, CCRP, Exercise Physiologist    Medication changes reported      No    Fall or balance concerns reported     No    Tobacco Cessation  No Change    Warm-up and Cool-down  Performed as group-led instruction    Resistance Training Performed  Yes    VAD Patient?  No    PAD/SET Patient?  No      Pain Assessment   Currently in Pain?  No/denies    Multiple Pain Sites  No          Social History   Tobacco Use  Smoking Status Current Every Day Smoker  . Packs/day: 2.00  . Years: 36.00  . Pack years: 72.00  . Types: Cigarettes  Smokeless Tobacco Never Used  Tobacco Comment   Patient 5 PPD; recently cut back to 1.5 daily    Goals Met:  Independence with exercise equipment Exercise tolerated well No report of cardiac concerns or symptoms Strength training completed today  Goals Unmet:  Not Applicable  Comments: Pt able to follow exercise prescription today without complaint.  Will continue to monitor for progression.    Dr. Emily Filbert is Medical Director for [name] and [name].

## 2018-10-10 NOTE — Patient Instructions (Signed)
Discharge Patient Instructions  Patient Details  Name: Thomas Romero MRN: [id number] Date of Birth: 08/26/1966 Referring Provider:  Wellington Hampshire, MD   Number of Visits: 38  Reason for Discharge:  Patient reached a stable level of exercise. Patient independent in their exercise. Patient has met program and personal goals.  Smoking History:  Social History   Tobacco Use  Smoking Status Current Every Day Smoker  . Packs/day: 2.00  . Years: 36.00  . Pack years: 72.00  . Types: Cigarettes  Smokeless Tobacco Never Used  Tobacco Comment   Patient 5 PPD; recently cut back to 1.5 daily    Diagnosis:  NSTEMI (non-ST elevated myocardial infarction) ([place])  Status post coronary artery stent placement  Initial Exercise Prescription: Initial Exercise Prescription - 08/15/18 1200      Date of Initial Exercise RX and Referring Provider   Date  08/15/18    Referring Provider  Kathlyn Sacramento MD      Treadmill   MPH  2.5    Grade  1    Minutes  15    METs  3.26      Recumbant Bike   Level  4    RPM  50    Watts  49    Minutes  15    METs  3.2      T5 Nustep   Level  4    SPM  80    Minutes  15    METs  3      Prescription Details   Frequency (times per week)  3    Duration  Progress to 45 minutes of aerobic exercise without signs/symptoms of physical distress      Intensity   THRR 40-80% of Max Heartrate  104-147    Ratings of Perceived Exertion  11-13    Perceived Dyspnea  0-4      Progression   Progression  Continue to progress workloads to maintain intensity without signs/symptoms of physical distress.      Resistance Training   Training Prescription  Yes    Weight  4 lbs    Reps  10-15       Discharge Exercise Prescription (Final Exercise Prescription Changes): Exercise Prescription Changes - 09/28/18 1400      Response to Exercise   Blood Pressure (Admit)  104/60    Blood Pressure (Exercise)  164/70    Blood Pressure (Exit)  108/62    Heart  Rate (Admit)  72 bpm    Heart Rate (Exercise)  130 bpm    Heart Rate (Exit)  83 bpm    Rating of Perceived Exertion (Exercise)  13    Symptoms  none    Duration  Continue with 45 min of aerobic exercise without signs/symptoms of physical distress.    Intensity  THRR unchanged      Progression   Progression  Continue to progress workloads to maintain intensity without signs/symptoms of physical distress.    Average METs  3.31      Resistance Training   Training Prescription  Yes    Weight  4 lbs    Reps  10-15      Interval Training   Interval Training  No      Treadmill   MPH  3    Grade  2    Minutes  15    METs  4.12      Elliptical   Level  8    Speed  3  Minutes  15      T5 Nustep   Level  6    Minutes  15    METs  2.5      Home Exercise Plan   Plans to continue exercise at  Home (comment)   2 days a week   Frequency  Add 2 additional days to program exercise sessions.    Initial Home Exercises Provided  08/24/18       Functional Capacity: 6 Minute Walk    Row Name 08/15/18 1251 10/05/18 1427       6 Minute Walk   Phase  Initial  Discharge    Distance  1300 feet  1565 feet    Distance % Change  -  20.4 %    Distance Feet Change  -  265 ft    Walk Time  6 minutes  6 minutes    # of Rest Breaks  0  0    MPH  2.46  2.96    METS  4.43  4.82    RPE  11  13    VO2 Peak  15.5  16.87    Symptoms  No  No    Resting HR  61 bpm  81 bpm    Resting BP  134/60  108/66    Resting Oxygen Saturation   99 %  -    Exercise Oxygen Saturation  during 6 min walk  100 %  -    Max Ex. HR  100 bpm  90 bpm    Max Ex. BP  138/62  148/66    2 Minute Post BP  122/62  -       Quality of Life: Quality of Life - 10/03/18 0757      Quality of Life   Select  Quality of Life      Quality of Life Scores   Health/Function Pre  21.47 %    Health/Function Post  24 %    Health/Function % Change  11.78 %    Socioeconomic Pre  21.64 %    Socioeconomic Post  21.92 %     Socioeconomic % Change   1.29 %    Psych/Spiritual Pre  20.5 %    Psych/Spiritual Post  28.5 %    Psych/Spiritual % Change  39.02 %    Family Pre  25.2 %    Family Post  27.6 %    Family % Change  9.52 %    GLOBAL Pre  21.89 %    GLOBAL Post  24.93 %    GLOBAL % Change  13.89 %       Personal Goals: Goals established at orientation with interventions provided to work toward goal. Personal Goals and Risk Factors at Admission - 08/15/18 1241      Core Components/Risk Factors/Patient Goals on Admission    Weight Management  Yes;Weight Maintenance    Intervention  Weight Management: Develop a combined nutrition and exercise program designed to reach desired caloric intake, while maintaining appropriate intake of nutrient and fiber, sodium and fats, and appropriate energy expenditure required for the weight goal.;Weight Management: Provide education and appropriate resources to help participant work on and attain dietary goals.    Admit Weight  140 lb 9.6 oz (63.8 kg)    Expected Outcomes  Short Term: Continue to assess and modify interventions until short term weight is achieved;Long Term: Adherence to nutrition and physical activity/exercise program aimed toward attainment of established weight goal;Weight Maintenance:  Understanding of the daily nutrition guidelines, which includes 25-35% calories from fat, 7% or less cal from saturated fats, less than 266m cholesterol, less than 1.5gm of sodium, & 5 or more servings of fruits and vegetables daily;Understanding recommendations for meals to include 15-35% energy as protein, 25-35% energy from fat, 35-60% energy from carbohydrates, less than 2055mof dietary cholesterol, 20-35 gm of total fiber daily;Understanding of distribution of calorie intake throughout the day with the consumption of 4-5 meals/snacks    Tobacco Cessation  Yes    Number of packs per day  1.5   patient stated he has smoked for the past 45 years and does not plan to quit    Intervention  Assist the participant in steps to quit. Provide individualized education and counseling about committing to Tobacco Cessation, relapse prevention, and pharmacological support that can be provided by physician.;OfAdvice workerassist with locating and accessing local/national Quit Smoking programs, and support quit date choice.    Expected Outcomes  Short Term: Will demonstrate readiness to quit, by selecting a quit date.;Short Term: Will quit all tobacco product use, adhering to prevention of relapse plan.;Long Term: Complete abstinence from all tobacco products for at least 12 months from quit date.    Hypertension  Yes    Intervention  Monitor prescription use compliance.;Provide education on lifestyle modifcations including regular physical activity/exercise, weight management, moderate sodium restriction and increased consumption of fresh fruit, vegetables, and low fat dairy, alcohol moderation, and smoking cessation.    Expected Outcomes  Short Term: Continued assessment and intervention until BP is < 140/9064mG in hypertensive participants. < 130/66m71m in hypertensive participants with diabetes, heart failure or chronic kidney disease.;Long Term: Maintenance of blood pressure at goal levels.        Personal Goals Discharge: Goals and Risk Factor Review - 09/16/18 0836      Core Components/Risk Factors/Patient Goals Review   Personal Goals Review  Weight Management/Obesity;Tobacco Cessation;Hypertension    Review  Thomas Romero's weight has been staying steady around 140-147 lbs which is his normal.  He is still smoking and does not wish to quit as it helps control his anxiety and keeps him controlled.  He has ordered a new blood pressure cuff and scale for free to track his pressure  and weight at home.  We talked about the improtance of recording it.      Expected Outcomes  Short: Start checking and recording pressures when he gets his new cuff.  Long: Continue to  monitor his risk factors.        Exercise Goals and Review: Exercise Goals    Row Name 08/15/18 1254             Exercise Goals   Increase Physical Activity  Yes       Intervention  Provide advice, education, support and counseling about physical activity/exercise needs.;Develop an individualized exercise prescription for aerobic and resistive training based on initial evaluation findings, risk stratification, comorbidities and participant's personal goals.       Expected Outcomes  Short Term: Attend rehab on a regular basis to increase amount of physical activity.;Long Term: Add in home exercise to make exercise part of routine and to increase amount of physical activity.;Long Term: Exercising regularly at least 3-5 days a week.       Increase Strength and Stamina  Yes       Intervention  Develop an individualized exercise prescription for aerobic and resistive training based on initial evaluation findings, risk  stratification, comorbidities and participant's personal goals.;Provide advice, education, support and counseling about physical activity/exercise needs.       Expected Outcomes  Short Term: Increase workloads from initial exercise prescription for resistance, speed, and METs.;Short Term: Perform resistance training exercises routinely during rehab and add in resistance training at home;Long Term: Improve cardiorespiratory fitness, muscular endurance and strength as measured by increased METs and functional capacity (6MWT)       Able to understand and use rate of perceived exertion (RPE) scale  Yes       Intervention  Provide education and explanation on how to use RPE scale       Expected Outcomes  Short Term: Able to use RPE daily in rehab to express subjective intensity level;Long Term:  Able to use RPE to guide intensity level when exercising independently       Knowledge and understanding of Target Heart Rate Range (THRR)  Yes       Intervention  Provide education and explanation  of THRR including how the numbers were predicted and where they are located for reference       Expected Outcomes  Short Term: Able to state/look up THRR;Short Term: Able to use daily as guideline for intensity in rehab;Long Term: Able to use THRR to govern intensity when exercising independently       Able to check pulse independently  Yes       Intervention  Provide education and demonstration on how to check pulse in carotid and radial arteries.;Review the importance of being able to check your own pulse for safety during independent exercise       Expected Outcomes  Short Term: Able to explain why pulse checking is important during independent exercise;Long Term: Able to check pulse independently and accurately       Understanding of Exercise Prescription  Yes       Intervention  Provide education, explanation, and written materials on patient's individual exercise prescription       Expected Outcomes  Short Term: Able to explain program exercise prescription;Long Term: Able to explain home exercise prescription to exercise independently          Exercise Goals Re-Evaluation: Exercise Goals Re-Evaluation    Row Name 08/24/18 0909 08/29/18 1021 09/14/18 1502 09/16/18 0835 09/28/18 1419     Exercise Goal Re-Evaluation   Exercise Goals Review  Increase Physical Activity;Increase Strength and Stamina;Able to understand and use rate of perceived exertion (RPE) scale;Able to understand and use Dyspnea scale;Knowledge and understanding of Target Heart Rate Range (THRR);Able to check pulse independently;Understanding of Exercise Prescription  Increase Physical Activity;Increase Strength and Stamina;Understanding of Exercise Prescription  Increase Physical Activity;Increase Strength and Stamina;Understanding of Exercise Prescription  Increase Physical Activity;Increase Strength and Stamina;Understanding of Exercise Prescription  Increase Physical Activity;Increase Strength and Stamina;Understanding of  Exercise Prescription   Comments  Reviewed home exercise with pt today.  Pt plans to walk at home an extra two days a week for exercise.  Reviewed THR, pulse, RPE, sign and symptoms, NTG use, and when to call 911 or MD.  Also discussed weather considerations and indoor options.  Pt voiced understanding.  Thomas Romero is off to a good start in rehab.  He is enjoying it more than he thought he would.  So far, he is tolerating the group atmosphere okay.  He is ready to start to increase his workloads.  We will continue to monitor his progress.   Thomas Romero continues to do well in rehab. He is enjoying coming  to class.  He is only level 4 of the NuStep at 2.5 METs.  We will continue to monitor his progression.   Thomas Romero has been doing well in rehab.  He has been doing some of his walking at home as the weather allows.  He is feeling like his strength and stamina are getting better overall.   Thomas Romero continues to do well in rehab.  He is now up to level 6 on the T5 and level 8 on the elliptical.  We talked about adding in intervals to the treadmill as he is good at 3.0 mph.  We will continue to monitor his progress.    Expected Outcomes  Short: add two extra days of walking at home. Long: maintain home exercise independently  Short: Begin to increase workloads.  Long: Continue to exercise on his own at home.   Short: Increase treadmill  Long: Continue to improve strength and stamina.   Short: Continue to try to walk at home.  Long: Continue to improve strength and stamina.   Short: Try intervals on treadmill.  Long: Continue to exercise more      Nutrition & Weight - Outcomes: Pre Biometrics - 08/15/18 1254      Pre Biometrics   Height  5' 8.9" (1.75 m)    Weight  140 lb 9.6 oz (63.8 kg)    Waist Circumference  32.5 inches    Hip Circumference  35.5 inches    Waist to Hip Ratio  0.92 %    BMI (Calculated)  20.82    Single Leg Stand  22.56 seconds      Post Biometrics - 10/05/18 1428       Post  Biometrics    Height  5' 8.9" (1.75 m)    Weight  144 lb 4.8 oz (65.5 kg)    Waist Circumference  33 inches    Hip Circumference  35.5 inches    Waist to Hip Ratio  0.93 %    BMI (Calculated)  21.37    Single Leg Stand  12.9 seconds       Nutrition: Nutrition Therapy & Goals - 08/22/18 0926      Nutrition Therapy   Diet  TLC    Drug/Food Interactions  Statins/Certain Fruits    Protein (specify units)  8-9oz    Fiber  30 grams    Whole Grain Foods  3 servings   does not eat grains regularly but will occasionally choose whole grain bread for sandwhiches   Saturated Fats  15 max. grams    Fruits and Vegetables  4 servings/day   does not eat fruits or vegetables regularly   Sodium  1500 grams      Personal Nutrition Goals   Nutrition Goal  Eat a meal or snack during the first part of the day at least one day per week consistently.    Personal Goal #2  Continue to work on drinking water in place of [place]. Dew and Sweet tea    Personal Goal #3  Eat one additional serving of fruit or vegetables each week    Comments  He typically eats 1 meal / day, sometimes 2, and does not typcially eat fruit d/t disliking it. Eats green beans and corn for vegetables and does not usually eat grains. He drinks very little water; drinks mostly [place] Dew and sweet tea. He has trouble gaining wt per his report. Eats mostly meat which he bakes in the oven. Uses salt "depending on how  he's preparing the meat."      Intervention Plan   Intervention  Prescribe, educate and counsel regarding individualized specific dietary modifications aiming towards targeted core components such as weight, hypertension, lipid management, diabetes, heart failure and other comorbidities.    Expected Outcomes  Short Term Goal: Understand basic principles of dietary content, such as calories, fat, sodium, cholesterol and nutrients.;Short Term Goal: A plan has been developed with personal nutrition goals set during dietitian appointment.;Long Term Goal:  Adherence to prescribed nutrition plan.       Nutrition Discharge: Nutrition Assessments - 10/03/18 0757      MEDFICTS Scores   Pre Score  65    Post Score  51    Score Difference  -14       Education Questionnaire Score: Knowledge Questionnaire Score - 10/03/18 0758      Knowledge Questionnaire Score   Pre Score  16/26    Post Score  21/26   test reviewed with pt today      Goals reviewed with patient; copy given to patient.

## 2018-10-12 DIAGNOSIS — I214 Non-ST elevation (NSTEMI) myocardial infarction: Secondary | ICD-10-CM

## 2018-10-12 NOTE — Progress Notes (Signed)
Discharge Progress Report  Patient Details  Name: Thomas Romero MRN: [id number] Date of Birth: 1965/11/13 Referring Provider:     Cardiac Rehab from 08/15/2018 in [facility] Cardiac [facility] Pulmonary [facility]  Referring Provider  Kathlyn Sacramento MD       Number of Visits: 36/36  Reason for Discharge:  Patient reached a stable level of exercise. Patient independent in their exercise. Patient has met program and personal goals.  Smoking History:  Social History   Tobacco Use  Smoking Status Current Every Day Smoker  . Packs/day: 2.00  . Years: 36.00  . Pack years: 72.00  . Types: Cigarettes  Smokeless Tobacco Never Used  Tobacco Comment   Patient 5 PPD; recently cut back to 1.5 daily    Diagnosis:  NSTEMI (non-ST elevated myocardial infarction) ([employer])  ADL [facility]:   Initial Exercise Prescription: Initial Exercise Prescription - 08/15/18 1200      Date of Initial Exercise RX and Referring Provider   Date  08/15/18    Referring Provider  Kathlyn Sacramento MD      Treadmill   MPH  2.5    Grade  1    Minutes  15    METs  3.26      Recumbant Bike   Level  4    RPM  50    Watts  49    Minutes  15    METs  3.2      T5 Nustep   Level  4    SPM  80    Minutes  15    METs  3      Prescription Details   Frequency (times per week)  3    Duration  Progress to 45 minutes of aerobic exercise without signs/symptoms of physical distress      Intensity   THRR 40-80% of Max Heartrate  104-147    Ratings of Perceived Exertion  11-13    Perceived Dyspnea  0-4      Progression   Progression  Continue to progress workloads to maintain intensity without signs/symptoms of physical distress.      Resistance Training   Training Prescription  Yes    Weight  4 lbs    Reps  10-15       Discharge Exercise Prescription (Final Exercise Prescription Changes): Exercise Prescription Changes - 09/28/18 1400      Response to Exercise   Blood Pressure (Admit)  104/60    Blood Pressure  (Exercise)  164/70    Blood Pressure (Exit)  108/62    Heart Rate (Admit)  72 bpm    Heart Rate (Exercise)  130 bpm    Heart Rate (Exit)  83 bpm    Rating of Perceived Exertion (Exercise)  13    Symptoms  none    Duration  Continue with 45 min of aerobic exercise without signs/symptoms of physical distress.    Intensity  THRR unchanged      Progression   Progression  Continue to progress workloads to maintain intensity without signs/symptoms of physical distress.    Average METs  3.31      Resistance Training   Training Prescription  Yes    Weight  4 lbs    Reps  10-15      Interval Training   Interval Training  No      Treadmill   MPH  3    Grade  2    Minutes  15    METs  4.12  Elliptical   Level  8    Speed  3    Minutes  15      T5 Nustep   Level  6    Minutes  15    METs  2.5      Home Exercise Plan   Plans to continue exercise at  Home (comment)   2 days a week   Frequency  Add 2 additional days to program exercise sessions.    Initial Home Exercises Provided  08/24/18       Functional Capacity: 6 Minute Walk    Row Name 08/15/18 1251 10/05/18 1427       6 Minute Walk   Phase  Initial  Discharge    Distance  1300 feet  1565 feet    Distance % Change  -  20.4 %    Distance Feet Change  -  265 ft    Walk Time  6 minutes  6 minutes    # of Rest Breaks  0  0    MPH  2.46  2.96    METS  4.43  4.82    RPE  11  13    VO2 Peak  15.5  16.87    Symptoms  No  No    Resting HR  61 bpm  81 bpm    Resting BP  134/60  108/66    Resting Oxygen Saturation   99 %  -    Exercise Oxygen Saturation  during 6 min walk  100 %  -    Max Ex. HR  100 bpm  90 bpm    Max Ex. BP  138/62  148/66    2 Minute Post BP  122/62  -       Psychological, QOL, Others - Outcomes: PHQ 2/9: Depression screen [facility] 2/9 10/03/2018 08/15/2018  Decreased Interest 0 0  Down, Depressed, Hopeless 0 1  PHQ - 2 Score 0 1  Altered sleeping 1 2  Tired, decreased energy 0 1  Change  in appetite 0 0  Feeling bad or failure about yourself  0 1  Trouble concentrating 0 0  Moving slowly or fidgety/restless 0 0  Suicidal thoughts 0 0  PHQ-9 Score 1 5  Difficult doing work/chores Not difficult at all Not difficult at all    Quality of Life: Quality of Life - 10/03/18 0757      Quality of Life   Select  Quality of Life      Quality of Life Scores   Health/Function Pre  21.47 %    Health/Function Post  24 %    Health/Function % Change  11.78 %    Socioeconomic Pre  21.64 %    Socioeconomic Post  21.92 %    Socioeconomic % Change   1.29 %    Psych/Spiritual Pre  20.5 %    Psych/Spiritual Post  28.5 %    Psych/Spiritual % Change  39.02 %    Family Pre  25.2 %    Family Post  27.6 %    Family % Change  9.52 %    GLOBAL Pre  21.89 %    GLOBAL Post  24.93 %    GLOBAL % Change  13.89 %       Personal Goals: Goals established at orientation with interventions provided to work toward goal. Personal Goals and Risk Factors at Admission - 08/15/18 1241      Core Components/Risk Factors/Patient Goals on Admission  Weight Management  Yes;Weight Maintenance    Intervention  Weight Management: Develop a combined nutrition and exercise program designed to reach desired caloric intake, while maintaining appropriate intake of nutrient and fiber, sodium and fats, and appropriate energy expenditure required for the weight goal.;Weight Management: Provide education and appropriate resources to help participant work on and attain dietary goals.    Admit Weight  140 lb 9.6 oz (63.8 kg)    Expected Outcomes  Short Term: Continue to assess and modify interventions until short term weight is achieved;Long Term: Adherence to nutrition and physical activity/exercise program aimed toward attainment of established weight goal;Weight Maintenance: Understanding of the daily nutrition guidelines, which includes 25-35% calories from fat, 7% or less cal from saturated fats, less than 226m  cholesterol, less than 1.5gm of sodium, & 5 or more servings of fruits and vegetables daily;Understanding recommendations for meals to include 15-35% energy as protein, 25-35% energy from fat, 35-60% energy from carbohydrates, less than 2099mof dietary cholesterol, 20-35 gm of total fiber daily;Understanding of distribution of calorie intake throughout the day with the consumption of 4-5 meals/snacks    Tobacco Cessation  Yes    Number of packs per day  1.5   patient stated he has smoked for the past 45 years and does not plan to quit   Intervention  Assist the participant in steps to quit. Provide individualized education and counseling about committing to Tobacco Cessation, relapse prevention, and pharmacological support that can be provided by physician.;OfAdvice workerassist with locating and accessing local/national Quit Smoking programs, and support quit date choice.    Expected Outcomes  Short Term: Will demonstrate readiness to quit, by selecting a quit date.;Short Term: Will quit all tobacco product use, adhering to prevention of relapse plan.;Long Term: Complete abstinence from all tobacco products for at least 12 months from quit date.    Hypertension  Yes    Intervention  Monitor prescription use compliance.;Provide education on lifestyle modifcations including regular physical activity/exercise, weight management, moderate sodium restriction and increased consumption of fresh fruit, vegetables, and low fat dairy, alcohol moderation, and smoking cessation.    Expected Outcomes  Short Term: Continued assessment and intervention until BP is < 140/9025mG in hypertensive participants. < 130/60m50m in hypertensive participants with diabetes, heart failure or chronic kidney disease.;Long Term: Maintenance of blood pressure at goal levels.        Personal Goals Discharge: Goals and Risk Factor Review    Row Name 09/16/18 [id number]         Core Components/Risk  Factors/Patient Goals Review   Personal Goals Review  Weight Management/Obesity;Tobacco Cessation;Hypertension       Review  Thomas Romero's weight has been staying steady around 140-147 lbs which is his normal.  He is still smoking and does not wish to quit as it helps control his anxiety and keeps him controlled.  He has ordered a new blood pressure cuff and scale for free to track his pressure  and weight at home.  We talked about the improtance of recording it.         Expected Outcomes  Short: Start checking and recording pressures when he gets his new cuff.  Long: Continue to monitor his risk factors.           Exercise Goals and Review: Exercise Goals    Row Name 08/15/18 1254             Exercise Goals   Increase Physical  Activity  Yes       Intervention  Provide advice, education, support and counseling about physical activity/exercise needs.;Develop an individualized exercise prescription for aerobic and resistive training based on initial evaluation findings, risk stratification, comorbidities and participant's personal goals.       Expected Outcomes  Short Term: Attend rehab on a regular basis to increase amount of physical activity.;Long Term: Add in home exercise to make exercise part of routine and to increase amount of physical activity.;Long Term: Exercising regularly at least 3-5 days a week.       Increase Strength and Stamina  Yes       Intervention  Develop an individualized exercise prescription for aerobic and resistive training based on initial evaluation findings, risk stratification, comorbidities and participant's personal goals.;Provide advice, education, support and counseling about physical activity/exercise needs.       Expected Outcomes  Short Term: Increase workloads from initial exercise prescription for resistance, speed, and METs.;Short Term: Perform resistance training exercises routinely during rehab and add in resistance training at home;Long Term: Improve  cardiorespiratory fitness, muscular endurance and strength as measured by increased METs and functional capacity (6MWT)       Able to understand and use rate of perceived exertion (RPE) scale  Yes       Intervention  Provide education and explanation on how to use RPE scale       Expected Outcomes  Short Term: Able to use RPE daily in rehab to express subjective intensity level;Long Term:  Able to use RPE to guide intensity level when exercising independently       Knowledge and understanding of Target Heart Rate Range (THRR)  Yes       Intervention  Provide education and explanation of THRR including how the numbers were predicted and where they are located for reference       Expected Outcomes  Short Term: Able to state/look up THRR;Short Term: Able to use daily as guideline for intensity in rehab;Long Term: Able to use THRR to govern intensity when exercising independently       Able to check pulse independently  Yes       Intervention  Provide education and demonstration on how to check pulse in carotid and radial arteries.;Review the importance of being able to check your own pulse for safety during independent exercise       Expected Outcomes  Short Term: Able to explain why pulse checking is important during independent exercise;Long Term: Able to check pulse independently and accurately       Understanding of Exercise Prescription  Yes       Intervention  Provide education, explanation, and written materials on patient's individual exercise prescription       Expected Outcomes  Short Term: Able to explain program exercise prescription;Long Term: Able to explain home exercise prescription to exercise independently          Exercise Goals Re-Evaluation: Exercise Goals Re-Evaluation    Row Name 08/24/18 0909 08/29/18 1021 09/14/18 1502 09/16/18 0835 09/28/18 1419     Exercise Goal Re-Evaluation   Exercise Goals Review  Increase Physical Activity;Increase Strength and Stamina;Able to  understand and use rate of perceived exertion (RPE) scale;Able to understand and use Dyspnea scale;Knowledge and understanding of Target Heart Rate Range (THRR);Able to check pulse independently;Understanding of Exercise Prescription  Increase Physical Activity;Increase Strength and Stamina;Understanding of Exercise Prescription  Increase Physical Activity;Increase Strength and Stamina;Understanding of Exercise Prescription  Increase Physical Activity;Increase Strength and Stamina;Understanding of  Exercise Prescription  Increase Physical Activity;Increase Strength and Stamina;Understanding of Exercise Prescription   Comments  Reviewed home exercise with pt today.  Pt plans to walk at home an extra two days a week for exercise.  Reviewed THR, pulse, RPE, sign and symptoms, NTG use, and when to call 911 or MD.  Also discussed weather considerations and indoor options.  Pt voiced understanding.  Thomas Romero is off to a good start in rehab.  He is enjoying it more than he thought he would.  So far, he is tolerating the group atmosphere okay.  He is ready to start to increase his workloads.  We will continue to monitor his progress.   Thomas Romero continues to do well in rehab. He is enjoying coming to class.  He is only level 4 of the NuStep at 2.5 METs.  We will continue to monitor his progression.   Thomas Romero has been doing well in rehab.  He has been doing some of his walking at home as the weather allows.  He is feeling like his strength and stamina are getting better overall.   Thomas Romero continues to do well in rehab.  He is now up to level 6 on the T5 and level 8 on the elliptical.  We talked about adding in intervals to the treadmill as he is good at 3.0 mph.  We will continue to monitor his progress.    Expected Outcomes  Short: add two extra days of walking at home. Long: maintain home exercise independently  Short: Begin to increase workloads.  Long: Continue to exercise on his own at home.   Short: Increase treadmill  Long:  Continue to improve strength and stamina.   Short: Continue to try to walk at home.  Long: Continue to improve strength and stamina.   Short: Try intervals on treadmill.  Long: Continue to exercise more      Nutrition & Weight - Outcomes: Pre Biometrics - 08/15/18 1254      Pre Biometrics   Height  5' 8.9" (1.75 m)    Weight  140 lb 9.6 oz (63.8 kg)    Waist Circumference  32.5 inches    Hip Circumference  35.5 inches    Waist to Hip Ratio  0.92 %    BMI (Calculated)  20.82    Single Leg Stand  22.56 seconds      Post Biometrics - 10/05/18 1428       Post  Biometrics   Height  5' 8.9" (1.75 m)    Weight  144 lb 4.8 oz (65.5 kg)    Waist Circumference  33 inches    Hip Circumference  35.5 inches    Waist to Hip Ratio  0.93 %    BMI (Calculated)  21.37    Single Leg Stand  12.9 seconds       Nutrition: Nutrition Therapy & Goals - 08/22/18 0926      Nutrition Therapy   Diet  TLC    Drug/Food Interactions  Statins/Certain Fruits    Protein (specify units)  8-9oz    Fiber  30 grams    Whole Grain Foods  3 servings   does not eat grains regularly but will occasionally choose whole grain bread for sandwhiches   Saturated Fats  15 max. grams    Fruits and Vegetables  4 servings/day   does not eat fruits or vegetables regularly   Sodium  1500 grams      Personal Nutrition Goals   Nutrition Goal  Eat a meal or snack during the first part of the day at least one day per week consistently.    Personal Goal #2  Continue to work on drinking water in place of [place]. Dew and Sweet tea    Personal Goal #3  Eat one additional serving of fruit or vegetables each week    Comments  He typically eats 1 meal / day, sometimes 2, and does not typcially eat fruit d/t disliking it. Eats green beans and corn for vegetables and does not usually eat grains. He drinks very little water; drinks mostly [place]. Dew and sweet tea. He has trouble gaining wt per his report. 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 Uses salt "depending on how he's preparing the meat."      Intervention Plan   Intervention  Prescribe, educate and counsel regarding individualized specific dietary modifications aiming towards targeted core components such as weight, hypertension, lipid management, diabetes, heart failure and other comorbidities.    Expected Outcomes  Short Term Goal: Understand basic principles of dietary content, such as calories, fat, sodium, cholesterol and nutrients.;Short Term Goal: A plan has been developed with personal nutrition goals set during dietitian appointment.;Long Term Goal: Adherence to prescribed nutrition plan.       Nutrition Discharge: Nutrition Assessments - 10/03/18 0757      MEDFICTS Scores   Pre Score  65    Post Score  51    Score Difference  -14       Education Questionnaire Score: Knowledge Questionnaire Score - 10/03/18 0758      Knowledge Questionnaire Score   Pre Score  16/26    Post Score  21/26   test reviewed with pt today      Goals reviewed with patient; copy given to patient.

## 2018-10-12 NOTE — Progress Notes (Signed)
Daily Session Note  Patient Details  Name: Thomas Romero MRN: [id number] Date of Birth: September 27, 1965 Referring Provider:     Cardiac Rehab from 08/15/2018 in [facility] Cardiac and Pulmonary Rehab  Referring Provider  Kathlyn Sacramento MD      Encounter Date: 10/12/2018  Check In: Session Check In - 10/12/18 1030      Check-In   Supervising physician immediately available to respond to emergencies  See telemetry face sheet for immediately available ER MD    Location  [facility]-Cardiac & Pulmonary Rehab    Staff Present  Justin Mend Lorre Nick, MA, RCEP, CCRP, Exercise Physiologist;Susanne Bice, RN, BSN, CCRP    Medication changes reported      No    Fall or balance concerns reported     No    Warm-up and Cool-down  Performed as group-led instruction    Resistance Training Performed  Yes    VAD Patient?  No    PAD/SET Patient?  No      Pain Assessment   Currently in Pain?  No/denies          Social History   Tobacco Use  Smoking Status Current Every Day Smoker  . Packs/day: 2.00  . Years: 36.00  . Pack years: 72.00  . Types: Cigarettes  Smokeless Tobacco Never Used  Tobacco Comment   Patient 5 PPD; recently cut back to 1.5 daily    Goals Met:  Independence with exercise equipment Exercise tolerated well No report of cardiac concerns or symptoms Strength training completed today  Goals Unmet:  Not Applicable  Comments:  Deavon graduated today from  rehab with 36 sessions completed.  Details of the patient's exercise prescription and what He needs to do in order to continue the prescription and progress were discussed with patient.  Patient was given a copy of prescription and goals.  Patient verbalized understanding.  Zenas plans to continue to exercise by walking at home.    Dr. Emily Filbert is Medical Director for [place] and [name].

## 2018-10-12 NOTE — Progress Notes (Signed)
Cardiac Individual Treatment Plan  Patient Details  Name: GEREMIAH FUSSELL MRN: [id number] Date of Birth: Aug 24, 1966 Referring Provider:     Cardiac Rehab from 08/15/2018 in [facility] Cardiac and Pulmonary Rehab  Referring Provider  Kathlyn Sacramento MD      Initial Encounter Date:    Cardiac Rehab from 08/15/2018 in [facility] Cardiac and Pulmonary Rehab  Date  08/15/18      Visit Diagnosis: NSTEMI (non-ST elevated myocardial infarction) [facility])  Patient's Home Medications on Admission:  Current Outpatient Medications:  .  aspirin 81 MG chewable tablet, Chew 1 tablet (81 mg total) by mouth daily., Disp: 90 tablet, Rfl: 0 .  atorvastatin (LIPITOR) 80 MG tablet, Take 1 tablet (80 mg total) by mouth daily at 6 PM., Disp: 30 tablet, Rfl: 6 .  ezetimibe (ZETIA) 10 MG tablet, Take 1 tablet (10 mg total) by mouth daily., Disp: 90 tablet, Rfl: 3 .  nitroGLYCERIN (NITROSTAT) 0.4 MG SL tablet, Place 1 tablet (0.4 mg total) under the tongue every 5 (five) minutes as needed for chest pain., Disp: 25 tablet, Rfl: 1 .  pantoprazole (PROTONIX) 40 MG tablet, Take 1 tablet (40 mg total) by mouth daily., Disp: 30 tablet, Rfl: 6 .  ticagrelor (BRILINTA) 90 MG TABS tablet, Take 1 tablet (90 mg total) by mouth 2 (two) times daily., Disp: 60 tablet, Rfl: 6  Past Medical History: Past Medical History:  Diagnosis Date  . Coronary artery disease    Non-ST elevation myocardial infarction in November 2019.  Cardiac catheterization showed 99% stenosis in the mid RCA treated successfully with PCI and drug-eluting stent placement in moderate mid left circumflex disease.  . Gastric ulcer   . Hyperlipidemia   . Manic depression ([place])   . Panic attacks   . Tobacco use     Tobacco Use: Social History   Tobacco Use  Smoking Status Current Every Day Smoker  . Packs/day: 2.00  . Years: 36.00  . Pack years: 72.00  . Types: Cigarettes  Smokeless Tobacco Never Used  Tobacco Comment   Patient 5 PPD; recently cut back to 1.5  daily    Labs: Recent Review Flowsheet Data    Labs for ITP Cardiac and Pulmonary Rehab Latest Ref Rng & Units 07/25/2018 09/28/2018   Cholestrol 100 - 199 mg/dL 203(H) 149   LDLCALC 0 - 99 mg/dL 152(H) 99   LDLDIRECT 0 - 99 mg/dL - 105(H)   HDL >39 mg/dL 24(L) 25(L)   Trlycerides 0 - 149 mg/dL 134 126       Exercise Target Goals: Exercise Program Goal: Individual exercise prescription set using results from initial 6 min walk test and THRR while considering  patient's activity barriers and safety.   Exercise Prescription Goal: Initial exercise prescription builds to 30-45 minutes a day of aerobic activity, 2-3 days per week.  Home exercise guidelines will be given to patient during program as part of exercise prescription that the participant will acknowledge.  Activity Barriers & Risk Stratification: Activity Barriers & Cardiac Risk Stratification - 08/15/18 1240      Activity Barriers & Cardiac Risk Stratification   Activity Barriers  Arthritis;Back Problems;Joint Problems;Deconditioning;Muscular Weakness   left knee injury from high school, still bothers him   Cardiac Risk Stratification  High       6 Minute Walk: 6 Minute Walk    Row Name 08/15/18 1251 10/05/18 1427       6 Minute Walk   Phase  Initial  Discharge    Distance  1300 feet  1565 feet    Distance % Change  -  20.4 %    Distance Feet Change  -  265 ft    Walk Time  6 minutes  6 minutes    # of Rest Breaks  0  0    MPH  2.46  2.96    METS  4.43  4.82    RPE  11  13    VO2 Peak  15.5  16.87    Symptoms  No  No    Resting HR  61 bpm  81 bpm    Resting BP  134/60  108/66    Resting Oxygen Saturation   99 %  -    Exercise Oxygen Saturation  during 6 min walk  100 %  -    Max Ex. HR  100 bpm  90 bpm    Max Ex. BP  138/62  148/66    2 Minute Post BP  122/62  -       Oxygen Initial Assessment:   Oxygen Re-Evaluation:   Oxygen Discharge (Final Oxygen Re-Evaluation):   Initial Exercise  Prescription: Initial Exercise Prescription - 08/15/18 1200      Date of Initial Exercise RX and Referring Provider   Date  08/15/18    Referring Provider  Kathlyn Sacramento MD      Treadmill   MPH  2.5    Grade  1    Minutes  15    METs  3.26      Recumbant Bike   Level  4    RPM  50    Watts  49    Minutes  15    METs  3.2      T5 Nustep   Level  4    SPM  80    Minutes  15    METs  3      Prescription Details   Frequency (times per week)  3    Duration  Progress to 45 minutes of aerobic exercise without signs/symptoms of physical distress      Intensity   THRR 40-80% of Max Heartrate  104-147    Ratings of Perceived Exertion  11-13    Perceived Dyspnea  0-4      Progression   Progression  Continue to progress workloads to maintain intensity without signs/symptoms of physical distress.      Resistance Training   Training Prescription  Yes    Weight  4 lbs    Reps  10-15       Perform Capillary Blood Glucose checks as needed.  Exercise Prescription Changes: Exercise Prescription Changes    Row Name 08/15/18 1200 08/24/18 0900 08/29/18 1000 09/14/18 1500 09/28/18 1400     Response to Exercise   Blood Pressure (Admit)  134/60  -  128/72  122/66  104/60   Blood Pressure (Exercise)  138/62  -  128/74  138/76  164/70   Blood Pressure (Exit)  122/62  -  132/70  98/58  108/62   Heart Rate (Admit)  61 bpm  -  75 bpm  88 bpm  72 bpm   Heart Rate (Exercise)  100 bpm  -  140 bpm  116 bpm  130 bpm   Heart Rate (Exit)  65 bpm  -  82 bpm  78 bpm  83 bpm   Oxygen Saturation (Admit)  99 %  -  -  -  -   Oxygen Saturation (  Exercise)  100 %  -  -  -  -   Rating of Perceived Exertion (Exercise)  11  -  12  12  13    Symptoms  none  -  none  none  none   Comments  walk test results  -  -  -  -   Duration  -  -  Continue with 45 min of aerobic exercise without signs/symptoms of physical distress.  Continue with 45 min of aerobic exercise without signs/symptoms of physical  distress.  Continue with 45 min of aerobic exercise without signs/symptoms of physical distress.   Intensity  -  -  THRR unchanged  THRR unchanged  THRR unchanged     Progression   Progression  -  -  Continue to progress workloads to maintain intensity without signs/symptoms of physical distress.  Continue to progress workloads to maintain intensity without signs/symptoms of physical distress.  Continue to progress workloads to maintain intensity without signs/symptoms of physical distress.   Average METs  -  -  3.19  2.88  3.31     Resistance Training   Training Prescription  -  -  Yes  Yes  Yes   Weight  -  -  4 lbs  4 lbs  4 lbs   Reps  -  -  10-15  10-15  10-15     Interval Training   Interval Training  -  -  No  No  No     Treadmill   MPH  -  -  2.5  2.5  3   Grade  -  -  1  1  2    Minutes  -  -  15  15  15    METs  -  -  3.26  3.26  4.12     Recumbant Bike   Level  -  -  4  -  -   Minutes  -  -  15  -  -   METs  -  -  4.1  -  -     Elliptical   Level  -  -  -  1  8   Speed  -  -  -  3  3   Minutes  -  -  -  15  15     T5 Nustep   Level  -  -  4  4  6    Minutes  -  -  15  15  15    METs  -  -  2.2  2.5  2.5     Home Exercise Plan   Plans to continue exercise at  -  Home (comment) 2 days a week  Home (comment) 2 days a week  Home (comment) 2 days a week  Home (comment) 2 days a week   Frequency  -  Add 2 additional days to program exercise sessions.  Add 2 additional days to program exercise sessions.  Add 2 additional days to program exercise sessions.  Add 2 additional days to program exercise sessions.   Initial Home Exercises Provided  -  08/24/18  08/24/18  08/24/18  08/24/18      Exercise Comments: Exercise Comments    Row Name 08/19/18 1020 10/12/18 5462         Exercise Comments  First full day of exercise!  Patient was oriented to gym and equipment including functions, settings, policies, and procedures.  Patient's individual exercise prescription and  treatment plan were reviewed.  All starting workloads were established based on the results of the 6 minute walk test done at initial orientation visit.  The plan for exercise progression was also introduced and progression will be customized based on patient's performance and goals.   Derak graduated today from  rehab with 36 sessions completed.  Details of the patient's exercise prescription and what He needs to do in order to continue the prescription and progress were discussed with patient.  Patient was given a copy of prescription and goals.  Patient verbalized understanding.  Emmaus plans to continue to exercise by walking at home.         Exercise Goals and Review: Exercise Goals    Row Name 08/15/18 1254             Exercise Goals   Increase Physical Activity  Yes       Intervention  Provide advice, education, support and counseling about physical activity/exercise needs.;Develop an individualized exercise prescription for aerobic and resistive training based on initial evaluation findings, risk stratification, comorbidities and participant's personal goals.       Expected Outcomes  Short Term: Attend rehab on a regular basis to increase amount of physical activity.;Long Term: Add in home exercise to make exercise part of routine and to increase amount of physical activity.;Long Term: Exercising regularly at least 3-5 days a week.       Increase Strength and Stamina  Yes       Intervention  Develop an individualized exercise prescription for aerobic and resistive training based on initial evaluation findings, risk stratification, comorbidities and participant's personal goals.;Provide advice, education, support and counseling about physical activity/exercise needs.       Expected Outcomes  Short Term: Increase workloads from initial exercise prescription for resistance, speed, and METs.;Short Term: Perform resistance training exercises routinely during rehab and add in resistance training  at home;Long Term: Improve cardiorespiratory fitness, muscular endurance and strength as measured by increased METs and functional capacity (6MWT)       Able to understand and use rate of perceived exertion (RPE) scale  Yes       Intervention  Provide education and explanation on how to use RPE scale       Expected Outcomes  Short Term: Able to use RPE daily in rehab to express subjective intensity level;Long Term:  Able to use RPE to guide intensity level when exercising independently       Knowledge and understanding of Target Heart Rate Range (THRR)  Yes       Intervention  Provide education and explanation of THRR including how the numbers were predicted and where they are located for reference       Expected Outcomes  Short Term: Able to state/look up THRR;Short Term: Able to use daily as guideline for intensity in rehab;Long Term: Able to use THRR to govern intensity when exercising independently       Able to check pulse independently  Yes       Intervention  Provide education and demonstration on how to check pulse in carotid and radial arteries.;Review the importance of being able to check your own pulse for safety during independent exercise       Expected Outcomes  Short Term: Able to explain why pulse checking is important during independent exercise;Long Term: Able to check pulse independently and accurately       Understanding of Exercise Prescription  Yes       Intervention  Provide  education, explanation, and written materials on patient's individual exercise prescription       Expected Outcomes  Short Term: Able to explain program exercise prescription;Long Term: Able to explain home exercise prescription to exercise independently          Exercise Goals Re-Evaluation : Exercise Goals Re-Evaluation    Row Name 08/24/18 0909 08/29/18 1021 09/14/18 1502 09/16/18 0835 09/28/18 1419     Exercise Goal Re-Evaluation   Exercise Goals Review  Increase Physical Activity;Increase Strength  and Stamina;Able to understand and use rate of perceived exertion (RPE) scale;Able to understand and use Dyspnea scale;Knowledge and understanding of Target Heart Rate Range (THRR);Able to check pulse independently;Understanding of Exercise Prescription  Increase Physical Activity;Increase Strength and Stamina;Understanding of Exercise Prescription  Increase Physical Activity;Increase Strength and Stamina;Understanding of Exercise Prescription  Increase Physical Activity;Increase Strength and Stamina;Understanding of Exercise Prescription  Increase Physical Activity;Increase Strength and Stamina;Understanding of Exercise Prescription   Comments  Reviewed home exercise with pt today.  Pt plans to walk at home an extra two days a week for exercise.  Reviewed THR, pulse, RPE, sign and symptoms, NTG use, and when to call 911 or MD.  Also discussed weather considerations and indoor options.  Pt voiced understanding.  Gerren is off to a good start in rehab.  He is enjoying it more than he thought he would.  So far, he is tolerating the group atmosphere okay.  He is ready to start to increase his workloads.  We will continue to monitor his progress.   Ardis continues to do well in rehab. He is enjoying coming to class.  He is only level 4 of the NuStep at 2.5 METs.  We will continue to monitor his progression.   Antion has been doing well in rehab.  He has been doing some of his walking at home as the weather allows.  He is feeling like his strength and stamina are getting better overall.   Brinton continues to do well in rehab.  He is now up to level 6 on the T5 and level 8 on the elliptical.  We talked about adding in intervals to the treadmill as he is good at 3.0 mph.  We will continue to monitor his progress.    Expected Outcomes  Short: add two extra days of walking at home. Long: maintain home exercise independently  Short: Begin to increase workloads.  Long: Continue to exercise on his own at home.   Short:  Increase treadmill  Long: Continue to improve strength and stamina.   Short: Continue to try to walk at home.  Long: Continue to improve strength and stamina.   Short: Try intervals on treadmill.  Long: Continue to exercise more      Discharge Exercise Prescription (Final Exercise Prescription Changes): Exercise Prescription Changes - 09/28/18 1400      Response to Exercise   Blood Pressure (Admit)  104/60    Blood Pressure (Exercise)  164/70    Blood Pressure (Exit)  108/62    Heart Rate (Admit)  72 bpm    Heart Rate (Exercise)  130 bpm    Heart Rate (Exit)  83 bpm    Rating of Perceived Exertion (Exercise)  13    Symptoms  none    Duration  Continue with 45 min of aerobic exercise without signs/symptoms of physical distress.    Intensity  THRR unchanged      Progression   Progression  Continue to progress workloads to maintain intensity without  signs/symptoms of physical distress.    Average METs  3.31      Resistance Training   Training Prescription  Yes    Weight  4 lbs    Reps  10-15      Interval Training   Interval Training  No      Treadmill   MPH  3    Grade  2    Minutes  15    METs  4.12      Elliptical   Level  8    Speed  3    Minutes  15      T5 Nustep   Level  6    Minutes  15    METs  2.5      Home Exercise Plan   Plans to continue exercise at  Home (comment)   2 days a week   Frequency  Add 2 additional days to program exercise sessions.    Initial Home Exercises Provided  08/24/18       Nutrition:  Target Goals: Understanding of nutrition guidelines, daily intake of sodium <1572m, cholesterol <2040m calories 30% from fat and 7% or less from saturated fats, daily to have 5 or more servings of fruits and vegetables.  Biometrics: Pre Biometrics - 08/15/18 1254      Pre Biometrics   Height  5' 8.9" (1.75 m)    Weight  140 lb 9.6 oz (63.8 kg)    Waist Circumference  32.5 inches    Hip Circumference  35.5 inches    Waist to Hip Ratio   0.92 %    BMI (Calculated)  20.82    Single Leg Stand  22.56 seconds      Post Biometrics - 10/05/18 1428       Post  Biometrics   Height  5' 8.9" (1.75 m)    Weight  144 lb 4.8 oz (65.5 kg)    Waist Circumference  33 inches    Hip Circumference  35.5 inches    Waist to Hip Ratio  0.93 %    BMI (Calculated)  21.37    Single Leg Stand  12.9 seconds       Nutrition Therapy Plan and Nutrition Goals: Nutrition Therapy & Goals - 08/22/18 0926      Nutrition Therapy   Diet  TLC    Drug/Food Interactions  Statins/Certain Fruits    Protein (specify units)  8-9oz    Fiber  30 grams    Whole Grain Foods  3 servings   does not eat grains regularly but will occasionally choose whole grain bread for sandwhiches   Saturated Fats  15 max. grams    Fruits and Vegetables  4 servings/day   does not eat fruits or vegetables regularly   Sodium  1500 grams      Personal Nutrition Goals   Nutrition Goal  Eat a meal or snack during the first part of the day at least one day per week consistently.    Personal Goal #2  Continue to work on drinking water in place of [place] and Sweet tea    Personal Goal #3  Eat one additional serving of fruit or vegetables each week    Comments  He typically eats 1 meal / day, sometimes 2, and does not typcially eat fruit d/t disliking it. Eats green beans and corn for vegetables and does not usually eat grains. He drinks very little water; drinks mostly [place] Dew and sweet tea. He  has trouble gaining wt per his report. Eats mostly meat which he bakes in the oven. Uses salt "depending on how he's preparing the meat."      Intervention Plan   Intervention  Prescribe, educate and counsel regarding individualized specific dietary modifications aiming towards targeted core components such as weight, hypertension, lipid management, diabetes, heart failure and other comorbidities.    Expected Outcomes  Short Term Goal: Understand basic principles of dietary content, such as  calories, fat, sodium, cholesterol and nutrients.;Short Term Goal: A plan has been developed with personal nutrition goals set during dietitian appointment.;Long Term Goal: Adherence to prescribed nutrition plan.       Nutrition Assessments: Nutrition Assessments - 10/03/18 0757      MEDFICTS Scores   Pre Score  65    Post Score  51    Score Difference  -14       Nutrition Goals Re-Evaluation: Nutrition Goals Re-Evaluation    [place] Name 08/22/18 0947             Goals   Nutrition Goal  Eat a meal or snack during the first part of the day at least one day per week consistently.       Comment  He usually only eats dinner but states he has trouble gaining weight. He has never been a breakfast eater and is not willing to start       Expected Outcome  He will eat a small meal or snack at lunch time at least one day/week. Long term goal: eat 2 meals/day consistently         Personal Goal #2 Re-Evaluation   Personal Goal #2  Continue to work on drinking water in place of [place]. Dew and Sweet tea         Personal Goal #3 Re-Evaluation   Personal Goal #3  Eat one additional serving of fruit or vegetables each week          Nutrition Goals Discharge (Final Nutrition Goals Re-Evaluation): Nutrition Goals Re-Evaluation - 08/22/18 0947      Goals   Nutrition Goal  Eat a meal or snack during the first part of the day at least one day per week consistently.    Comment  He usually only eats dinner but states he has trouble gaining weight. He has never been a breakfast eater and is not willing to start    Expected Outcome  He will eat a small meal or snack at lunch time at least one day/week. Long term goal: eat 2 meals/day consistently      Personal Goal #2 Re-Evaluation   Personal Goal #2  Continue to work on drinking water in place of [place]. Dew and Sweet tea      Personal Goal #3 Re-Evaluation   Personal Goal #3  Eat one additional serving of fruit or vegetables each week        Psychosocial: Target Goals: Acknowledge presence or absence of significant depression and/or stress, maximize coping skills, provide positive support system. Participant is able to verbalize types and ability to use techniques and skills needed for reducing stress and depression.   Initial Review & Psychosocial Screening: Initial Psych Review & Screening - 08/15/18 1242      Initial Review   Current issues with  History of Depression;Current Anxiety/Panic;Current Sleep Concerns;Current Stress Concerns    Source of Stress Concerns  Poor Coping Skills;Retirement/disability    Comments  Jasean is on disability related to mental health. He reports anxiety,  manic depression, history of panic attacks. He stopped taking all medications related to these issues in 11-18-2003 once both his parents died. He says he knows how to cope when he feels an "episode" coming on. usually he just leaves the situation (left AMA from hospital after stent because he wanted a cig). He dropped out of high school at 16 and taught himself how to read and write after he dropped out of school. He doesn't do much during the day due to back pain (which he does not take anything for).       Family Dynamics   Good Support System?  Yes   wife     Barriers   Psychosocial barriers to participate in program  The patient should benefit from training in stress management and relaxation.      Screening Interventions   Interventions  Encouraged to exercise;Program counselor consult;To provide support and resources with identified psychosocial needs;Provide feedback about the scores to participant    Expected Outcomes  Short Term goal: Utilizing psychosocial counselor, staff and physician to assist with identification of specific Stressors or current issues interfering with healing process. Setting desired goal for each stressor or current issue identified.;Long Term Goal: Stressors or current issues are controlled or eliminated.;Short  Term goal: Identification and review with participant of any Quality of Life or Depression concerns found by scoring the questionnaire.;Long Term goal: The participant improves quality of Life and PHQ9 Scores as seen by post scores and/or verbalization of changes       Quality of Life Scores:  Quality of Life - 10/03/18 0757      Quality of Life   Select  Quality of Life      Quality of Life Scores   Health/Function Pre  21.47 %    Health/Function Post  24 %    Health/Function % Change  11.78 %    Socioeconomic Pre  21.64 %    Socioeconomic Post  21.92 %    Socioeconomic % Change   1.29 %    Psych/Spiritual Pre  20.5 %    Psych/Spiritual Post  28.5 %    Psych/Spiritual % Change  39.02 %    Family Pre  25.2 %    Family Post  27.6 %    Family % Change  9.52 %    GLOBAL Pre  21.89 %    GLOBAL Post  24.93 %    GLOBAL % Change  13.89 %      Scores of 19 and below usually indicate a poorer quality of life in these areas.  A difference of  2-3 points is a clinically meaningful difference.  A difference of 2-3 points in the total score of the Quality of Life Index has been associated with significant improvement in overall quality of life, self-image, physical symptoms, and general health in studies assessing change in quality of life.  PHQ-9: Recent Review Flowsheet Data    Depression screen [facility] 2/9 10/03/2018 08/15/2018   Decreased Interest 0 0   Down, Depressed, Hopeless 0 1   PHQ - 2 Score 0 1   Altered sleeping 1 2   Tired, decreased energy 0 1   Change in appetite 0 0   Feeling bad or failure about yourself  0 1   Trouble concentrating 0 0   Moving slowly or fidgety/restless 0 0   Suicidal thoughts 0 0   PHQ-9 Score 1 5   Difficult doing work/chores Not difficult at all Not difficult at  all     Interpretation of Total Score  Total Score Depression Severity:  1-4 = Minimal depression, 5-9 = Mild depression, 10-14 = Moderate depression, 15-19 = Moderately severe depression,  20-27 = Severe depression   Psychosocial Evaluation and Intervention: Psychosocial Evaluation - 09/05/18 0920      Psychosocial Evaluation & Interventions   Interventions  Stress management education;Relaxation education;Encouraged to exercise with the program and follow exercise prescription    Comments  Counselor met with Mr. Mackowski Remmers) today for initial psychosocial evaluation.  He is a 53 year old who had a heart attack and a stent inserted in November.  He has a strong support system with a spouse; her children and his sister all living in the same home.  He has a son who lives in [place] that is also very supportive.  Danh reports being healthy other than his heart and "mental health issues."  He admits to chronic sleep problem with maybe 2-3 hours of sleep nightly. He also admits to being diagnosed with Bipolar in 1998 and has refused medications since 2005.  He also reports having psychosis daily and he just "ignores it."  Kynan reports having an "okay" appetite and typically being in a positive mood.  He states that his primary stressors are relationship conflicts with adult "steps" who live in the same home.  He also has some financial concerns with being on disability since 1998 due to his mental health condition.  He denies current homicidal or suicidal ideations at this time.  Counselor recommended a sleep study to [place] since he reports his sleeping problems have been ongoing since he was a child.  he states that he is "claustrophobic" and would not wear a mask if he was prescribed one so will likely not comply with this recommendation.  Treyshon has a goal for this program to be heart healthier.  He will be followed by staff.      Expected Outcomes  Short:  Siddh is encouraged to exercise consistently for his health and mental health.  He is encouraged to check with his Dr. about a sleep study - but is reluctant to do so with his mental health issues.  Long:  Maciah will continue to take his  medications for his heart and abide by his Dr's orders concerning exercise and diet.      Continue Psychosocial Services   Follow up required by staff       Psychosocial Re-Evaluation: Psychosocial Re-Evaluation    [place] Name 09/16/18 0840 09/28/18 0900           Psychosocial Re-Evaluation   Current issues with  Current Psychotropic Meds;Current Sleep Concerns;Current Stress Concerns;Current Anxiety/Panic  Current Sleep Concerns;Current Stress Concerns;Current Anxiety/Panic;History of Depression      Comments  Whitfield has been doing well in rehab.  He is still smoking as it helps keep him sane and level headed.  He has been able to tolerate the group setting better than he thought he could.    He feels pretty good mentally.  He has been able to control his panic attacks and keep from feeling too crowded in.    He is able to notice that he is working out and feeling better.   Counselor follow with Cejay today reporting his stress has decreased as he has moved into an outbuilding to get away from his adult step children in the main home and this is working well for him.  His spouse has joined him out there and  other than running water they have everything they need.  He continues to sleep only ~2 hours per night and continues to stress that he will not have a sleep study nor treatment for this issue.  He reports feeling stronger physically since coming into this program.  His mental health concerns remain stable with him "handling it" by ignoring any psychosis and states this is working for him.  Damarion continues to smoke and that is his primary coping strategy as well as isolating himself and avoiding stress.  He continues to have financial problems but is able to make it from disability check to disability check.  He has done surprisingly well in this program in a group setting considering all of these concerns.  Staff will continue to follow with him.      Expected Outcomes  Short: Continue to attend  classes regularly.   Long: Continue to cope with anxiety positively.   Short:  Aldair will continue to exercise consistently to increase his stamina and strength and cope with stress in his life.  Long:  Johnney would benefit from a sleep study and treatment - but he refuses - so continued exercise will be beneficial for his health and mental health.       Interventions  Stress management education;Encouraged to attend Cardiac Rehabilitation for the exercise  -      Continue Psychosocial Services   Follow up required by staff  Follow up required by staff        Initial Review   Source of Stress Concerns  Poor Coping Skills;Retirement/disability  -         Psychosocial Discharge (Final Psychosocial Re-Evaluation): Psychosocial Re-Evaluation - 09/28/18 0900      Psychosocial Re-Evaluation   Current issues with  Current Sleep Concerns;Current Stress Concerns;Current Anxiety/Panic;History of Depression    Comments  Counselor follow with Maurio today reporting his stress has decreased as he has moved into an outbuilding to get away from his adult step children in the main home and this is working well for him.  His spouse has joined him out there and other than running water they have everything they need.  He continues to sleep only ~2 hours per night and continues to stress that he will not have a sleep study nor treatment for this issue.  He reports feeling stronger physically since coming into this program.  His mental health concerns remain stable with him "handling it" by ignoring any psychosis and states this is working for him.  Jaasiel continues to smoke and that is his primary coping strategy as well as isolating himself and avoiding stress.  He continues to have financial problems but is able to make it from disability check to disability check.  He has done surprisingly well in this program in a group setting considering all of these concerns.  Staff will continue to follow with him.    Expected  Outcomes  Short:  Samer will continue to exercise consistently to increase his stamina and strength and cope with stress in his life.  Long:  Dempsey would benefit from a sleep study and treatment - but he refuses - so continued exercise will be beneficial for his health and mental health.     Continue Psychosocial Services   Follow up required by staff       Vocational Rehabilitation: Provide vocational rehab assistance to qualifying candidates.   Vocational Rehab Evaluation & Intervention: Vocational Rehab - 08/15/18 1242      Initial Vocational  Rehab Evaluation & Intervention   Assessment shows need for Vocational Rehabilitation  No       Education: Education Goals: Education classes will be provided on a variety of topics geared toward better understanding of heart health and risk factor modification. Participant will state understanding/return demonstration of topics presented as noted by education test scores.  Learning Barriers/Preferences: Learning Barriers/Preferences - 08/15/18 1242      Learning Barriers/Preferences   Learning Barriers  Reading    Learning Preferences  None       Education Topics:  AED/CPR: - Group verbal and written instruction with the use of models to demonstrate the basic use of the AED with the basic ABC's of resuscitation.   Cardiac Rehab from 10/12/2018 in [facility] Cardiac and Pulmonary Rehab  Date  09/05/18  Educator  CE  Instruction Review Code  1- Verbalizes Understanding      General Nutrition Guidelines/Fats and Fiber: -Group instruction provided by verbal, written material, models and posters to present the general guidelines for heart healthy nutrition. Gives an explanation and review of dietary fats and fiber.   Cardiac Rehab from 10/12/2018 in [facility] Cardiac and Pulmonary Rehab  Date  09/19/18  Educator  LB  Instruction Review Code  1- Verbalizes Understanding      Controlling Sodium/Reading Food Labels: -Group verbal and written  material supporting the discussion of sodium use in heart healthy nutrition. Review and explanation with models, verbal and written materials for utilization of the food label.   Cardiac Rehab from 10/12/2018 in [facility] Cardiac and Pulmonary Rehab  Date  09/21/18  Educator  LB  Instruction Review Code  1- Verbalizes Understanding      Exercise Physiology & General Exercise Guidelines: - Group verbal and written instruction with models to review the exercise physiology of the cardiovascular system and associated critical values. Provides general exercise guidelines with specific guidelines to those with heart or lung disease.    Cardiac Rehab from 10/12/2018 in [facility] Cardiac and Pulmonary Rehab  Date  09/26/18  Educator  [facility]  Instruction Review Code  1- Verbalizes Understanding      Aerobic Exercise & Resistance Training: - Gives group verbal and written instruction on the various components of exercise. Focuses on aerobic and resistive training programs and the benefits of this training and how to safely progress through these programs..   Cardiac Rehab from 10/12/2018 in [facility] Cardiac and Pulmonary Rehab  Date  09/28/18  Educator  [facility]  Instruction Review Code  1- Verbalizes Understanding      Flexibility, Balance, Mind/Body Relaxation: Provides group verbal/written instruction on the benefits of flexibility and balance training, including mind/body exercise modes such as yoga, pilates and tai chi.  Demonstration and skill practice provided.   Cardiac Rehab from 10/12/2018 in [facility] Cardiac and Pulmonary Rehab  Date  10/03/18  Educator  [facility]  Instruction Review Code  1- Verbalizes Understanding      Stress and Anxiety: - Provides group verbal and written instruction about the health risks of elevated stress and causes of high stress.  Discuss the correlation between heart/lung disease and anxiety and treatment options. Review healthy ways to manage with stress and anxiety.   Depression: -  Provides group verbal and written instruction on the correlation between heart/lung disease and depressed mood, treatment options, and the stigmas associated with seeking treatment.   Cardiac Rehab from 10/12/2018 in [facility] Cardiac and Pulmonary Rehab  Date  09/14/18  Educator  [facility]  Instruction Review Code  1-  Verbalizes Understanding      Anatomy & Physiology of the Heart: - Group verbal and written instruction and models provide basic cardiac anatomy and physiology, with the coronary electrical and arterial systems. Review of Valvular disease and Heart Failure   Cardiac Rehab from 10/12/2018 in [facility] Cardiac and Pulmonary Rehab  Date  10/10/18  Educator  SB  Instruction Review Code  1- Verbalizes Understanding      Cardiac Procedures: - Group verbal and written instruction to review commonly prescribed medications for heart disease. Reviews the medication, class of the drug, and side effects. Includes the steps to properly store meds and maintain the prescription regimen. (beta blockers and nitrates)   Cardiac Medications I: - Group verbal and written instruction to review commonly prescribed medications for heart disease. Reviews the medication, class of the drug, and side effects. Includes the steps to properly store meds and maintain the prescription regimen.   Cardiac Rehab from 10/12/2018 in [facility] Cardiac and Pulmonary Rehab  Date  08/22/18  Educator  CE  Instruction Review Code  1- Verbalizes Understanding      Cardiac Medications II: -Group verbal and written instruction to review commonly prescribed medications for heart disease. Reviews the medication, class of the drug, and side effects. (all other drug classes)   Cardiac Rehab from 10/12/2018 in [facility] Cardiac and Pulmonary Rehab  Date  10/05/18  Educator  [facility]  Instruction Review Code  1- Verbalizes Understanding       Go Sex-Intimacy & Heart Disease, Get SMART - Goal Setting: - Group verbal and written instruction through game  format to discuss heart disease and the return to sexual intimacy. Provides group verbal and written material to discuss and apply goal setting through the application of the S.M.A.R.T. Method.   Other Matters of the Heart: - Provides group verbal, written materials and models to describe Stable Angina and Peripheral Artery. Includes description of the disease process and treatment options available to the cardiac patient.   Cardiac Rehab from 10/12/2018 in [facility] Cardiac and Pulmonary Rehab  Date  10/10/18  Educator  SB  Instruction Review Code  1- Verbalizes Understanding      Exercise & Equipment Safety: - Individual verbal instruction and demonstration of equipment use and safety with use of the equipment.   Cardiac Rehab from 10/12/2018 in [facility] Cardiac and Pulmonary Rehab  Date  08/15/18  Educator  [facility]  Instruction Review Code  1- Verbalizes Understanding      Infection Prevention: - Provides verbal and written material to individual with discussion of infection control including proper hand washing and proper equipment cleaning during exercise session.   Cardiac Rehab from 10/12/2018 in [facility] Cardiac and Pulmonary Rehab  Date  08/15/18  Educator  [facility]  Instruction Review Code  1- Verbalizes Understanding      Falls Prevention: - Provides verbal and written material to individual with discussion of falls prevention and safety.   Cardiac Rehab from 10/12/2018 in [facility] Cardiac and Pulmonary Rehab  Date  08/15/18  Educator  [facility]  Instruction Review Code  1- Verbalizes Understanding      Diabetes: - Individual verbal and written instruction to review signs/symptoms of diabetes, desired ranges of glucose level fasting, after meals and with exercise. Acknowledge that pre and post exercise glucose checks will be done for 3 sessions at entry of program.   Know Your Numbers and Risk Factors: -Group verbal and written instruction about important numbers in your health.  Discussion of what are  risk factors and how they play a role in the disease process.  Review of Cholesterol, Blood Pressure, Diabetes, and BMI and the role they play in your overall health.   Cardiac Rehab from 10/12/2018 in [facility] Cardiac and Pulmonary Rehab  Date  10/05/18  Educator  [facility]  Instruction Review Code  1- Verbalizes Understanding      Sleep Hygiene: -Provides group verbal and written instruction about how sleep can affect your health.  Define sleep hygiene, discuss sleep cycles and impact of sleep habits. Review good sleep hygiene tips.    Cardiac Rehab from 10/12/2018 in [facility] Cardiac and Pulmonary Rehab  Date  10/12/18  Educator  [facility]  Instruction Review Code  1- Verbalizes Understanding      Other: -Provides group and verbal instruction on various topics (see comments)   Knowledge Questionnaire Score: Knowledge Questionnaire Score - 10/03/18 0758      Knowledge Questionnaire Score   Pre Score  16/26    Post Score  21/26   test reviewed with pt today      Core Components/Risk Factors/Patient Goals at Admission: Personal Goals and Risk Factors at Admission - 08/15/18 1241      Core Components/Risk Factors/Patient Goals on Admission    Weight Management  Yes;Weight Maintenance    Intervention  Weight Management: Develop a combined nutrition and exercise program designed to reach desired caloric intake, while maintaining appropriate intake of nutrient and fiber, sodium and fats, and appropriate energy expenditure required for the weight goal.;Weight Management: Provide education and appropriate resources to help participant work on and attain dietary goals.    Admit Weight  140 lb 9.6 oz (63.8 kg)    Expected Outcomes  Short Term: Continue to assess and modify interventions until short term weight is achieved;Long Term: Adherence to nutrition and physical activity/exercise program aimed toward attainment of established weight goal;Weight Maintenance: Understanding of the daily nutrition guidelines,  which includes 25-35% calories from fat, 7% or less cal from saturated fats, less than 284m cholesterol, less than 1.5gm of sodium, & 5 or more servings of fruits and vegetables daily;Understanding recommendations for meals to include 15-35% energy as protein, 25-35% energy from fat, 35-60% energy from carbohydrates, less than 2048mof dietary cholesterol, 20-35 gm of total fiber daily;Understanding of distribution of calorie intake throughout the day with the consumption of 4-5 meals/snacks    Tobacco Cessation  Yes    Number of packs per day  1.5   patient stated he has smoked for the past 45 years and does not plan to quit   Intervention  Assist the participant in steps to quit. Provide individualized education and counseling about committing to Tobacco Cessation, relapse prevention, and pharmacological support that can be provided by physician.;OfAdvice workerassist with locating and accessing local/national Quit Smoking programs, and support quit date choice.    Expected Outcomes  Short Term: Will demonstrate readiness to quit, by selecting a quit date.;Short Term: Will quit all tobacco product use, adhering to prevention of relapse plan.;Long Term: Complete abstinence from all tobacco products for at least 12 months from quit date.    Hypertension  Yes    Intervention  Monitor prescription use compliance.;Provide education on lifestyle modifcations including regular physical activity/exercise, weight management, moderate sodium restriction and increased consumption of fresh fruit, vegetables, and low fat dairy, alcohol moderation, and smoking cessation.    Expected Outcomes  Short Term: Continued assessment and intervention until BP is < 140/90108mG in hypertensive participants. < 130/26m84m  HG in hypertensive participants with diabetes, heart failure or chronic kidney disease.;Long Term: Maintenance of blood pressure at goal levels.       Core Components/Risk Factors/Patient Goals  Review:  Goals and Risk Factor Review    Row Name 09/16/18 0836             Core Components/Risk Factors/Patient Goals Review   Personal Goals Review  Weight Management/Obesity;Tobacco Cessation;Hypertension       Review  Nymir's weight has been staying steady around 140-147 lbs which is his normal.  He is still smoking and does not wish to quit as it helps control his anxiety and keeps him controlled.  He has ordered a new blood pressure cuff and scale for free to track his pressure  and weight at home.  We talked about the improtance of recording it.         Expected Outcomes  Short: Start checking and recording pressures when he gets his new cuff.  Long: Continue to monitor his risk factors.           Core Components/Risk Factors/Patient Goals at Discharge (Final Review):  Goals and Risk Factor Review - 09/16/18 0836      Core Components/Risk Factors/Patient Goals Review   Personal Goals Review  Weight Management/Obesity;Tobacco Cessation;Hypertension    Review  Kaylem's weight has been staying steady around 140-147 lbs which is his normal.  He is still smoking and does not wish to quit as it helps control his anxiety and keeps him controlled.  He has ordered a new blood pressure cuff and scale for free to track his pressure  and weight at home.  We talked about the improtance of recording it.      Expected Outcomes  Short: Start checking and recording pressures when he gets his new cuff.  Long: Continue to monitor his risk factors.        ITP Comments: ITP Comments    Row Name 08/15/18 1237 09/06/18 0614 10/05/18 0904       ITP Comments  Med Review completed. Initial ITP created. Diagnosis can be found in [facility] 11/17  30 Day Review. Continue with ITP unless directed changes per Medical Director review New to program  30 Day Review. Continue with ITP unless directed changes per Medical Director review.        Comments: Discharge ITP

## 2018-10-24 ENCOUNTER — Encounter: Payer: Self-pay | Admitting: Emergency Medicine

## 2018-10-24 ENCOUNTER — Other Ambulatory Visit: Payer: Self-pay

## 2018-10-24 ENCOUNTER — Inpatient Hospital Stay
Admission: EM | Admit: 2018-10-24 | Discharge: 2018-10-26 | DRG: 392 | Disposition: A | Discharge status: Home / Self Care | Payer: Medicare Other | Attending: Surgery | Admitting: Surgery

## 2018-10-24 DIAGNOSIS — Z823 Family history of stroke: Secondary | ICD-10-CM

## 2018-10-24 DIAGNOSIS — Z886 Allergy status to analgesic agent status: Secondary | ICD-10-CM | POA: Diagnosis not present

## 2018-10-24 DIAGNOSIS — I700 Atherosclerosis of aorta: Secondary | ICD-10-CM | POA: Diagnosis present

## 2018-10-24 DIAGNOSIS — Z8249 Family history of ischemic heart disease and other diseases of the circulatory system: Secondary | ICD-10-CM | POA: Diagnosis not present

## 2018-10-24 DIAGNOSIS — Z7982 Long term (current) use of aspirin: Secondary | ICD-10-CM

## 2018-10-24 DIAGNOSIS — I252 Old myocardial infarction: Secondary | ICD-10-CM

## 2018-10-24 DIAGNOSIS — Z79899 Other long term (current) drug therapy: Secondary | ICD-10-CM | POA: Diagnosis not present

## 2018-10-24 DIAGNOSIS — F319 Bipolar disorder, unspecified: Secondary | ICD-10-CM | POA: Diagnosis present

## 2018-10-24 DIAGNOSIS — Z7902 Long term (current) use of antithrombotics/antiplatelets: Secondary | ICD-10-CM

## 2018-10-24 DIAGNOSIS — K810 Acute cholecystitis: Secondary | ICD-10-CM

## 2018-10-24 DIAGNOSIS — K259 Gastric ulcer, unspecified as acute or chronic, without hemorrhage or perforation: Secondary | ICD-10-CM | POA: Diagnosis present

## 2018-10-24 DIAGNOSIS — F17210 Nicotine dependence, cigarettes, uncomplicated: Secondary | ICD-10-CM | POA: Diagnosis present

## 2018-10-24 DIAGNOSIS — I2510 Atherosclerotic heart disease of native coronary artery without angina pectoris: Secondary | ICD-10-CM | POA: Diagnosis present

## 2018-10-24 DIAGNOSIS — R1032 Left lower quadrant pain: Secondary | ICD-10-CM | POA: Diagnosis present

## 2018-10-24 DIAGNOSIS — K219 Gastro-esophageal reflux disease without esophagitis: Secondary | ICD-10-CM | POA: Diagnosis present

## 2018-10-24 DIAGNOSIS — K8000 Calculus of gallbladder with acute cholecystitis without obstruction: Secondary | ICD-10-CM | POA: Diagnosis present

## 2018-10-24 DIAGNOSIS — K819 Cholecystitis, unspecified: Secondary | ICD-10-CM

## 2018-10-24 DIAGNOSIS — E785 Hyperlipidemia, unspecified: Secondary | ICD-10-CM | POA: Diagnosis present

## 2018-10-24 DIAGNOSIS — F410 Panic disorder [episodic paroxysmal anxiety] without agoraphobia: Secondary | ICD-10-CM | POA: Diagnosis present

## 2018-10-24 DIAGNOSIS — K5732 Diverticulitis of large intestine without perforation or abscess without bleeding: Secondary | ICD-10-CM | POA: Diagnosis present

## 2018-10-24 DIAGNOSIS — Z955 Presence of coronary angioplasty implant and graft: Secondary | ICD-10-CM | POA: Diagnosis not present

## 2018-10-24 DIAGNOSIS — F411 Generalized anxiety disorder: Secondary | ICD-10-CM | POA: Diagnosis present

## 2018-10-24 LAB — URINALYSIS, COMPLETE (UACMP) WITH MICROSCOPIC
Bacteria, UA: NONE SEEN
Bilirubin Urine: NEGATIVE
GLUCOSE, UA: NEGATIVE mg/dL
HGB URINE DIPSTICK: NEGATIVE
Ketones, ur: NEGATIVE mg/dL
Leukocytes,Ua: NEGATIVE
NITRITE: NEGATIVE
PROTEIN: NEGATIVE mg/dL
SPECIFIC GRAVITY, URINE: 1.017 (ref 1.005–1.030)
Squamous Epithelial / LPF: NONE SEEN (ref 0–5)
pH: 7 (ref 5.0–8.0)

## 2018-10-24 LAB — COMPREHENSIVE METABOLIC PANEL
ALBUMIN: 4.2 g/dL (ref 3.5–5.0)
ALT: 23 U/L (ref 0–44)
ANION GAP: 8 (ref 5–15)
AST: 22 U/L (ref 15–41)
Alkaline Phosphatase: 93 U/L (ref 38–126)
BILIRUBIN TOTAL: 0.8 mg/dL (ref 0.3–1.2)
BUN: 6 mg/dL (ref 6–20)
CO2: 29 mmol/L (ref 22–32)
Calcium: 9.1 mg/dL (ref 8.9–10.3)
Chloride: 102 mmol/L (ref 98–111)
Creatinine, Ser: 0.93 mg/dL (ref 0.61–1.24)
GFR calc Af Amer: >60 mL/min (ref >60)
GFR calc non Af Amer: >60 mL/min (ref >60)
Glucose, Bld: 120 mg/dL — ABNORMAL HIGH (ref 70–99)
POTASSIUM: 3.4 mmol/L — AB (ref 3.5–5.1)
SODIUM: 139 mmol/L (ref 135–145)
TOTAL PROTEIN: 7.8 g/dL (ref 6.5–8.1)

## 2018-10-24 LAB — LIPASE, BLOOD: LIPASE: 28 U/L (ref 11–51)

## 2018-10-24 LAB — CBC
HCT: 43.9 % (ref 39.0–52.0)
Hemoglobin: 15 g/dL (ref 13.0–17.0)
MCH: 30.5 pg (ref 26.0–34.0)
MCHC: 34.2 g/dL (ref 30.0–36.0)
MCV: 89.2 fL (ref 80.0–100.0)
NRBC: 0 % (ref 0.0–0.2)
Platelets: 493 10*3/uL — ABNORMAL HIGH (ref 150–400)
RBC: 4.92 MIL/uL (ref 4.22–5.81)
RDW: 12.7 % (ref 11.5–15.5)
WBC: 22.3 10*3/uL — AB (ref 4.0–10.5)

## 2018-10-24 MED ORDER — SODIUM CHLORIDE 0.9% FLUSH: 3.0000 mL | Freq: Once | INTRAVENOUS | Status: DC

## 2018-10-24 NOTE — ED Triage Notes (Signed)
Pt arrived to the ED accompanied by his wife for complaints of abdominal pain and vomiting since yesterday. Pt is not answering questions during triage his wife is giving all the information and she is a poor historian.Pt's wife states that she believes that the Pt has food poisoning since eating some "sausage links" yesterday. Pt is AOx4 in no apparent distress.

## 2018-10-25 ENCOUNTER — Encounter: Payer: Self-pay | Admitting: Radiology

## 2018-10-25 ENCOUNTER — Emergency Department: Payer: Medicare Other

## 2018-10-25 ENCOUNTER — Other Ambulatory Visit: Payer: Self-pay

## 2018-10-25 DIAGNOSIS — Z823 Family history of stroke: Secondary | ICD-10-CM | POA: Diagnosis not present

## 2018-10-25 DIAGNOSIS — Z7982 Long term (current) use of aspirin: Secondary | ICD-10-CM | POA: Diagnosis not present

## 2018-10-25 DIAGNOSIS — K8000 Calculus of gallbladder with acute cholecystitis without obstruction: Secondary | ICD-10-CM | POA: Diagnosis not present

## 2018-10-25 DIAGNOSIS — K5732 Diverticulitis of large intestine without perforation or abscess without bleeding: Secondary | ICD-10-CM | POA: Diagnosis not present

## 2018-10-25 DIAGNOSIS — F17210 Nicotine dependence, cigarettes, uncomplicated: Secondary | ICD-10-CM | POA: Diagnosis not present

## 2018-10-25 DIAGNOSIS — Z8249 Family history of ischemic heart disease and other diseases of the circulatory system: Secondary | ICD-10-CM | POA: Diagnosis not present

## 2018-10-25 DIAGNOSIS — R1032 Left lower quadrant pain: Secondary | ICD-10-CM | POA: Diagnosis present

## 2018-10-25 DIAGNOSIS — K259 Gastric ulcer, unspecified as acute or chronic, without hemorrhage or perforation: Secondary | ICD-10-CM | POA: Diagnosis not present

## 2018-10-25 DIAGNOSIS — F319 Bipolar disorder, unspecified: Secondary | ICD-10-CM | POA: Diagnosis not present

## 2018-10-25 DIAGNOSIS — K219 Gastro-esophageal reflux disease without esophagitis: Secondary | ICD-10-CM | POA: Diagnosis not present

## 2018-10-25 DIAGNOSIS — I252 Old myocardial infarction: Secondary | ICD-10-CM | POA: Diagnosis not present

## 2018-10-25 DIAGNOSIS — Z886 Allergy status to analgesic agent status: Secondary | ICD-10-CM | POA: Diagnosis not present

## 2018-10-25 DIAGNOSIS — Z79899 Other long term (current) drug therapy: Secondary | ICD-10-CM | POA: Diagnosis not present

## 2018-10-25 DIAGNOSIS — E785 Hyperlipidemia, unspecified: Secondary | ICD-10-CM | POA: Diagnosis not present

## 2018-10-25 DIAGNOSIS — Z955 Presence of coronary angioplasty implant and graft: Secondary | ICD-10-CM | POA: Diagnosis not present

## 2018-10-25 DIAGNOSIS — F411 Generalized anxiety disorder: Secondary | ICD-10-CM | POA: Diagnosis not present

## 2018-10-25 DIAGNOSIS — I700 Atherosclerosis of aorta: Secondary | ICD-10-CM | POA: Diagnosis not present

## 2018-10-25 DIAGNOSIS — Z7902 Long term (current) use of antithrombotics/antiplatelets: Secondary | ICD-10-CM | POA: Diagnosis not present

## 2018-10-25 DIAGNOSIS — F410 Panic disorder [episodic paroxysmal anxiety] without agoraphobia: Secondary | ICD-10-CM | POA: Diagnosis not present

## 2018-10-25 DIAGNOSIS — I2510 Atherosclerotic heart disease of native coronary artery without angina pectoris: Secondary | ICD-10-CM | POA: Diagnosis not present

## 2018-10-25 LAB — BASIC METABOLIC PANEL
Anion gap: 6 (ref 5–15)
BUN: 8 mg/dL (ref 6–20)
CALCIUM: 9.1 mg/dL (ref 8.9–10.3)
CO2: 30 mmol/L (ref 22–32)
Chloride: 101 mmol/L (ref 98–111)
Creatinine, Ser: 0.81 mg/dL (ref 0.61–1.24)
GFR calc Af Amer: >60 mL/min (ref >60)
GFR calc non Af Amer: >60 mL/min (ref >60)
Glucose, Bld: 121 mg/dL — ABNORMAL HIGH (ref 70–99)
Potassium: 3.8 mmol/L (ref 3.5–5.1)
Sodium: 137 mmol/L (ref 135–145)

## 2018-10-25 LAB — CBC
HCT: 41.6 % (ref 39.0–52.0)
Hemoglobin: 14.4 g/dL (ref 13.0–17.0)
MCH: 30.9 pg (ref 26.0–34.0)
MCHC: 34.6 g/dL (ref 30.0–36.0)
MCV: 89.3 fL (ref 80.0–100.0)
PLATELETS: 423 10*3/uL — AB (ref 150–400)
RBC: 4.66 MIL/uL (ref 4.22–5.81)
RDW: 12.9 % (ref 11.5–15.5)
WBC: 16.6 10*3/uL — AB (ref 4.0–10.5)
nRBC: 0 % (ref 0.0–0.2)

## 2018-10-25 MED ORDER — SODIUM CHLORIDE 0.9 % IV BOLUS
1000.0000 mL | Freq: Once | INTRAVENOUS | Status: AC
  Administered 2018-10-25: 1000 mL via INTRAVENOUS

## 2018-10-25 MED ORDER — PIPERACILLIN-TAZOBACTAM 4.5 G IVPB: 4.5000 g | Freq: Once | INTRAVENOUS | Status: DC

## 2018-10-25 MED ORDER — ONDANSETRON HCL 4 MG/2ML IJ SOLN
4.0000 mg | Freq: Once | INTRAMUSCULAR | Status: AC
  Administered 2018-10-25: 4 mg via INTRAVENOUS
  Filled 2018-10-25: qty 2

## 2018-10-25 MED ORDER — LACTATED RINGERS IV SOLN
INTRAVENOUS | Status: DC
  Administered 2018-10-25 – 2018-10-26 (×3): via INTRAVENOUS

## 2018-10-25 MED ORDER — TICAGRELOR 90 MG PO TABS
90.0000 mg | ORAL_TABLET | Freq: Two times a day (BID) | ORAL | Status: DC
  Administered 2018-10-25 – 2018-10-26 (×2): 90 mg via ORAL
  Filled 2018-10-25 (×4): qty 1

## 2018-10-25 MED ORDER — NITROGLYCERIN 0.4 MG SL SUBL: 0.4000 mg | SUBLINGUAL_TABLET | SUBLINGUAL | Status: DC | PRN

## 2018-10-25 MED ORDER — PIPERACILLIN-TAZOBACTAM 3.375 G IVPB 30 MIN
3.3750 g | Freq: Once | INTRAVENOUS | Status: AC
  Administered 2018-10-25: 3.375 g via INTRAVENOUS
  Filled 2018-10-25: qty 50

## 2018-10-25 MED ORDER — SODIUM CHLORIDE 0.9 % IV SOLN
INTRAVENOUS | Status: DC | PRN
  Administered 2018-10-25 – 2018-10-26 (×3): 250 mL via INTRAVENOUS

## 2018-10-25 MED ORDER — IOHEXOL 300 MG/ML  SOLN
100.0000 mL | Freq: Once | INTRAMUSCULAR | Status: AC | PRN
  Administered 2018-10-25: 100 mL via INTRAVENOUS

## 2018-10-25 MED ORDER — DICYCLOMINE HCL 10 MG/ML IM SOLN
20.0000 mg | Freq: Once | INTRAMUSCULAR | Status: AC
  Administered 2018-10-25: 20 mg via INTRAMUSCULAR
  Filled 2018-10-25 (×2): qty 2

## 2018-10-25 MED ORDER — ASPIRIN 81 MG PO CHEW
81.0000 mg | CHEWABLE_TABLET | Freq: Every day | ORAL | Status: DC
  Administered 2018-10-25 – 2018-10-26 (×2): 81 mg via ORAL
  Filled 2018-10-25 (×3): qty 1

## 2018-10-25 MED ORDER — ATORVASTATIN CALCIUM 20 MG PO TABS
80.0000 mg | ORAL_TABLET | Freq: Every day | ORAL | Status: DC
  Administered 2018-10-25: 80 mg via ORAL
  Filled 2018-10-25: qty 4

## 2018-10-25 MED ORDER — PIPERACILLIN-TAZOBACTAM 3.375 G IVPB
3.3750 g | Freq: Three times a day (TID) | INTRAVENOUS | Status: DC
  Administered 2018-10-25 – 2018-10-26 (×3): 3.375 g via INTRAVENOUS
  Filled 2018-10-25 (×3): qty 50

## 2018-10-25 MED ORDER — HEPARIN SODIUM (PORCINE) 5000 UNIT/ML IJ SOLN
5000.0000 [IU] | Freq: Three times a day (TID) | INTRAMUSCULAR | Status: DC
  Administered 2018-10-25 – 2018-10-26 (×3): 5000 [IU] via SUBCUTANEOUS
  Filled 2018-10-25 (×3): qty 1

## 2018-10-25 MED ORDER — EZETIMIBE 10 MG PO TABS
10.0000 mg | ORAL_TABLET | Freq: Every day | ORAL | Status: DC
  Administered 2018-10-25 – 2018-10-26 (×2): 10 mg via ORAL
  Filled 2018-10-25 (×2): qty 1

## 2018-10-25 MED ORDER — PANTOPRAZOLE SODIUM 40 MG PO TBEC
40.0000 mg | DELAYED_RELEASE_TABLET | Freq: Every day | ORAL | Status: DC
  Administered 2018-10-25 – 2018-10-26 (×2): 40 mg via ORAL
  Filled 2018-10-25 (×3): qty 1

## 2018-10-25 NOTE — H&P (Addendum)
SURGICAL HISTORY & PHYSICAL (cpt [phone])  HISTORY OF PRESENT ILLNESS (HPI):  53 y.o. Romero presented to [facility] ED tonight for abdominal pain. Patient reports he was awakened from sleep ~8 am yesterday morning due to nausea, which he attributes to sausage he hade for dinner the night prior to yesterday. Throughout the day, patient's nausea worsened, culminating yesterday afternoon with dry heaves, after which he says he developed moderate focal LLQ abdominal pain ~9 pm last night, which prompted him to seek further evaluation. He adamantly denies having experienced any current or prior RUQ, Right-sided, or epigastric abdominal pain and denies emesis, fever/chills, constipation, or diarrhea. He currently says his LLQ abdominal pain and has improved. Of note, patient remains on dual antiplatelet therapy for PCI with drug-eluting stent performed for NSTEMI within 3 months ago, though he currently denies any CP or SOB while at rest. He adds that his nerves won't let him quit smoking and he does not even want to cut back.  PAST MEDICAL HISTORY (PMH):  Past Medical History:  Diagnosis Date  . Coronary artery disease    Non-ST elevation myocardial infarction in November 2019.  Cardiac catheterization showed 99% stenosis in the mid RCA treated successfully with PCI and drug-eluting stent placement in moderate mid left circumflex disease.  . Gastric ulcer   . Hyperlipidemia   . Manic depression (HCC)   . Panic attacks   . Tobacco use     Reviewed. Otherwise negative.   PAST SURGICAL HISTORY (PSH):  Past Surgical History:  Procedure Laterality Date  . CORONARY/GRAFT ACUTE MI REVASCULARIZATION N/A 07/25/2018   Procedure: Coronary/Graft Acute MI Revascularization;  Surgeon: Iran Ouch, MD;  Location: [facility] INVASIVE CV LAB;  Service: Cardiovascular;  Laterality: N/A;  . LEFT HEART CATH AND CORONARY ANGIOGRAPHY N/A 07/25/2018   Procedure: LEFT HEART CATH AND CORONARY ANGIOGRAPHY;  Surgeon: Iran Ouch, MD;  Location: [facility] INVASIVE CV LAB;  Service: Cardiovascular;  Laterality: N/A;  . SMALL INTESTINE SURGERY      Reviewed. Otherwise negative.   MEDICATIONS:  Prior to Admission medications   Medication Sig Start Date End Date Taking? Authorizing Provider  aspirin 81 MG chewable tablet Chew 1 tablet (81 mg total) by mouth daily. 07/26/18  Yes Iran Ouch, MD  atorvastatin (LIPITOR) 80 MG tablet Take 1 tablet (80 mg total) by mouth daily at 6 PM. 07/25/18  Yes Iran Ouch, MD  ezetimibe (ZETIA) 10 MG tablet Take 1 tablet (10 mg total) by mouth daily. 09/29/18 12/28/18 Yes Dunn, Raymon Mutton, PA-C  nitroGLYCERIN (NITROSTAT) 0.4 MG SL tablet Place 1 tablet (0.4 mg total) under the tongue every 5 (five) minutes as needed for chest pain. 08/02/18  Yes Iran Ouch, MD  pantoprazole (PROTONIX) 40 MG tablet Take 1 tablet (40 mg total) by mouth daily. 07/26/18  Yes Iran Ouch, MD  ticagrelor (BRILINTA) 90 MG TABS tablet Take 1 tablet (90 mg total) by mouth 2 (two) times daily. 07/25/18  Yes Iran Ouch, MD    ALLERGIES:  Allergies  Allergen Reactions  . Ibuprofen Other (See Comments)    Gastric ulcers    SOCIAL HISTORY:  Social History   Socioeconomic History  . Marital status: Married    Spouse name: Not on file  . Number of children: Not on file  . Years of education: Not on file  . Highest education level: Not on file  Occupational History  . Not on file  Social Needs  . Physicist, medical  strain: Not on file  . Food insecurity:    Worry: Not on file    Inability: Not on file  . Transportation needs:    Medical: Not on file    Non-medical: Not on file  Tobacco Use  . Smoking status: Current Every Day Smoker    Packs/day: 2.00    Years: Thomas.00    Pack years: 72.00    Types: Cigarettes  . Smokeless tobacco: Never Used  . Tobacco comment: Patient 5 PPD; recently cut back to 1.5 daily  Substance and Sexual Activity  . Alcohol use: Yes  . Drug  use: No  . Sexual activity: Not on file  Lifestyle  . Physical activity:    Days per week: Not on file    Minutes per session: Not on file  . Stress: Not on file  Relationships  . Social connections:    Talks on phone: Not on file    Gets together: Not on file    Attends religious service: Not on file    Active member of club or organization: Not on file    Attends meetings of clubs or organizations: Not on file    Relationship status: Not on file  . Intimate partner violence:    Fear of current or ex partner: Not on file    Emotionally abused: Not on file    Physically abused: Not on file    Forced sexual activity: Not on file  Other Topics Concern  . Not on file  Social History Narrative  . Not on file    The patient currently resides (home / rehab facility / nursing home): Home The patient normally is (ambulatory / bedbound): Ambulatory  FAMILY HISTORY:  Family History  Problem Relation Age of Onset  . Heart attack Mother   . Stroke Father   . Heart attack Sister   . Heart attack Brother     Otherwise negative.   REVIEW OF SYSTEMS:  Constitutional: denies any other weight loss, fever, chills, or sweats  Eyes: denies any other vision changes, history of eye injury  ENT: denies sore throat, hearing problems  Respiratory: denies shortness of breath, wheezing  Cardiovascular: denies chest pain, palpitations  Gastrointestinal: abdominal pain, N/V, and bowel function as per HPI  Genitourinary: denies burning with urination or urinary frequency Musculoskeletal: denies any other joint pains or cramps  Skin: Denies any other rashes or skin discolorations  Neurological: denies any other headache, dizziness, weakness  Psychiatric: denies any other depression, anxiety   All other review of systems were otherwise negative.  VITAL SIGNS:  Temp:  [97.7 F (Thomas.5 C)] 97.7 F (Thomas.5 C) (02/17 2143) Pulse Rate:  [63-75] 63 (02/18 0430) Resp:  [18] 18 (02/17 2143) BP:  (150-173)/(75-96) 150/81 (02/18 0430) SpO2:  [96 %-99 %] 96 % (02/18 0430) Weight:  [63.5 kg] 63.5 kg (02/17 2144)     Height: 5\' 10"  (177.8 cm) Weight: 63.5 kg BMI (Calculated): 20.09   INTAKE/OUTPUT:  This shift: Total I/O In: 41.7 [IV Piggyback:41.7] Out: -   Last 2 shifts: @IOLAST2SHIFTS @  PHYSICAL EXAM:  Constitutional:  -- Normal body habitus  -- Awake, alert, and oriented x3, no apparent distress Eyes:  -- Pupils equally round and reactive to light  -- No scleral icterus, B/L no occular discharge Ear, nose, throat: -- Neck is FROM WNL -- No jugular venous distension  Pulmonary:  -- No wheezes or rhales -- Equal breath sounds bilaterally -- Breathing non-labored at rest Cardiovascular:  --  S1, S2 present  -- No pericardial rubs  Gastrointestinal:  -- Abdomen soft, nontender, non-distended, no guarding or rebound tenderness -- Mild focal LLQ abdominal tenderness to palpation, no RUQ or Right-sided abdominal pain -- No abdominal masses appreciated, pulsatile or otherwise  Musculoskeletal and Integumentary:  -- Wounds or skin discoloration: None appreciated -- Extremities: B/L UE and LE FROM, hands and feet warm, no edema  Neurologic:  -- Motor function: Intact and symmetric -- Sensation: Intact and symmetric Psychiatric:  -- Mood and affect WNL  Labs:  CBC Latest Ref Rng & Units 10/24/2018 09/28/2018 07/25/2018  WBC 4.0 - 10.5 K/uL 22.3(H) 7.3 10.9(H)  Hemoglobin 13.0 - 17.0 g/dL 74.1 63.8 45.3  Hematocrit 39.0 - 52.0 % 43.9 45.4 40.5  Platelets 150 - 400 K/uL 493(H) 232 171   CMP Latest Ref Rng & Units 10/24/2018 09/28/2018 07/25/2018  Glucose 70 - 99 mg/dL 646(O) 90 032(Z)  BUN 6 - 20 mg/dL 6 7 15   Creatinine 0.61 - 1.24 mg/dL 2.24 8.25 0.03  Sodium 135 - 145 mmol/L 139 140 138  Potassium 3.5 - 5.1 mmol/L 3.4(L) 4.1 3.3(L)  Chloride 98 - 111 mmol/L 102 103 106  CO2 22 - 32 mmol/L 29 22 26   Calcium 8.9 - 10.3 mg/dL 9.1 9.7 7.0(W)  Total Protein 6.5 - 8.1 g/dL  7.8 6.9 8.8(Q)  Total Bilirubin 0.3 - 1.2 mg/dL 0.8 0.3 0.8  Alkaline Phos 38 - 126 U/L 93 103 74  AST 15 - 41 U/L 22 26 72(H)  ALT 0 - 44 U/L 23 42 22   Imaging studies:  CT Abdomen and Pelvis with Contrast (10/25/2018) - personally reviewed and discussed with patient and ED physician 1. Subtle gallbladder wall thickening or edema suspicious for Acute Cholecystitis with possible gallstone in the neck. Right upper quadrant ultrasound may be confirmatory. 2. No other acute or inflammatory process identified in the abdomen or pelvis. Normal appendix. Right lower lobe atelectasis 3. Sigmoid colonic diverticulosis without diverticulitis 4. Aortic Atherosclerosis  Limited RUQ Abdominal Ultrasound (10/25/2018) Gallbladder: 2.1 centimeter gallstone lodged in the neck of the gallbladder with gallbladder wall thickening up to 5 millimeters. Trace pericholecystic fluid. Superimposed sludge.  Common bile duct: Diameter: 6 millimeters, upper limits of normal.  Assessment/Plan: (ICD-10's: R10.32, K81.0) 53 y.o. Romero with clinical presentation of LLQ abdominal pain and leukocytosis more consistent with diverticulitis, but mild cholecystitis on CT and RUQ abdominal ultrasound, complicated by pertinent comorbidities including HLD, CAD s/p PCI with drug-eluting stent placement for NSTEMI (07/2018) on chronic dual antiplatelet therapy, GERD with gastric PUD, generalized anxiety disorder, bipolar disorder, panic disdoer, and chronic ongoing tobacco abuse (smoking).    - NPO for now, IV fluids  - monitor abdominal exam and bowel function  - antibiotics started initially for cholecystitis with leukocytosis   - considering no RUQ abdominal symptoms, will reassess prior to consideration of any invasive procedures, surgery or perc chole tube  - follow up/trend WBC in morning  - DVT prophylaxis, ambulate  All of the above findings and recommendations were discussed with the patient and his wife, and all of  his and family's questions were answered to their expressed satisfaction.  -- Scherrie Gerlach Earlene Plater, MD, RPVI [place]: [facility] General Surgery - Partnering for exceptional care. Office: [phone]

## 2018-10-25 NOTE — ED Provider Notes (Signed)
[facility] Emergency Department Provider Note ____________________________________________   First MD Initiated Contact with Patient 10/25/18 0007     (approximate)  I have reviewed the triage vital signs and the nursing notes.   HISTORY  Chief Complaint Emesis and Abdominal Pain    HPI Thomas Romero is a 53 y.o. male with PMH as noted below who presents with vomiting since this morning, persistent course throughout the day with multiple nonbloody, nonbilious episodes.  It is associated with abdominal pain especially in the left side of his abdomen.  He does not have any diarrhea and states he had a normal bowel movement today.  The patient states he ate some sausage links yesterday which he is suspicious of.  Nobody else ate them.  He denies any other unusual foods or any travel.  He has not been on any antibiotics.   Past Medical History:  Diagnosis Date  . Coronary artery disease    Non-ST elevation myocardial infarction in November 2019.  Cardiac catheterization showed 99% stenosis in the mid RCA treated successfully with PCI and drug-eluting stent placement in moderate mid left circumflex disease.  . Gastric ulcer   . Hyperlipidemia   . Manic depression (HCC)   . Panic attacks   . Tobacco use     Patient Active Problem List   Diagnosis Date Noted  . Non-ST elevation (NSTEMI) myocardial infarction (HCC) 07/25/2018  . Anxiety 07/25/2018  . Depression 07/25/2018    Past Surgical History:  Procedure Laterality Date  . CORONARY/GRAFT ACUTE MI REVASCULARIZATION N/A 07/25/2018   Procedure: Coronary/Graft Acute MI Revascularization;  Surgeon: Iran OuchArida, Muhammad A, MD;  Location: [facility] INVASIVE CV LAB;  Service: Cardiovascular;  Laterality: N/A;  . LEFT HEART CATH AND CORONARY ANGIOGRAPHY N/A 07/25/2018   Procedure: LEFT HEART CATH AND CORONARY ANGIOGRAPHY;  Surgeon: Iran OuchArida, Muhammad A, MD;  Location: [facility] INVASIVE CV LAB;  Service: Cardiovascular;   Laterality: N/A;  . SMALL INTESTINE SURGERY      Prior to Admission medications   Medication Sig Start Date End Date Taking? Authorizing Provider  aspirin 81 MG chewable tablet Chew 1 tablet (81 mg total) by mouth daily. 07/26/18  Yes Iran OuchArida, Muhammad A, MD  atorvastatin (LIPITOR) 80 MG tablet Take 1 tablet (80 mg total) by mouth daily at 6 PM. 07/25/18  Yes Iran OuchArida, Muhammad A, MD  ezetimibe (ZETIA) 10 MG tablet Take 1 tablet (10 mg total) by mouth daily. 09/29/18 12/28/18 Yes Dunn, Raymon Muttonyan M, PA-C  nitroGLYCERIN (NITROSTAT) 0.4 MG SL tablet Place 1 tablet (0.4 mg total) under the tongue every 5 (five) minutes as needed for chest pain. 08/02/18  Yes Iran OuchArida, Muhammad A, MD  pantoprazole (PROTONIX) 40 MG tablet Take 1 tablet (40 mg total) by mouth daily. 07/26/18  Yes Iran OuchArida, Muhammad A, MD  ticagrelor (BRILINTA) 90 MG TABS tablet Take 1 tablet (90 mg total) by mouth 2 (two) times daily. 07/25/18  Yes Iran OuchArida, Muhammad A, MD    Allergies Ibuprofen  Family History  Problem Relation Age of Onset  . Heart attack Mother   . Stroke Father   . Heart attack Sister   . Heart attack Brother     Social History Social History   Tobacco Use  . Smoking status: Current Every Day Smoker    Packs/day: 2.00    Years: 36.00    Pack years: 72.00    Types: Cigarettes  . Smokeless tobacco: Never Used  . Tobacco comment: Patient 5 PPD; recently cut back to  1.5 daily  Substance Use Topics  . Alcohol use: Yes  . Drug use: No    Review of Systems  Constitutional: No fever. Eyes: No redness. ENT: No sore throat. Cardiovascular: Denies chest pain. Respiratory: Denies shortness of breath. Gastrointestinal: Positive for nausea vomiting. Genitourinary: Negative for dysuria.  Musculoskeletal: Negative for back pain. Skin: Negative for rash. Neurological: Negative for headaches.   ____________________________________________   PHYSICAL EXAM:  VITAL SIGNS: ED Triage Vitals  Enc Vitals Group     BP  10/24/18 2143 (!) 152/75     Pulse Rate 10/24/18 2143 69     Resp 10/24/18 2143 18     Temp 10/24/18 2143 97.7 F (36.5 C)     Temp Source 10/24/18 2143 Oral     SpO2 10/24/18 2143 99 %     Weight 10/24/18 2144 140 lb (63.5 kg)     Height 10/24/18 2144 5\' 10"  (1.778 m)     Head Circumference --      Peak Flow --      Pain Score 10/24/18 2156 8     Pain Loc --      Pain Edu? --      Excl. in GC? --     Constitutional: Alert and oriented.  Slightly uncomfortable appearing but in no acute distress. Eyes: Conjunctivae are normal.  No scleral icterus. Head: Atraumatic. Nose: No congestion/rhinnorhea. Mouth/Throat: Mucous membranes are dry.   Neck: Normal range of motion.  Cardiovascular: Good peripheral circulation. Respiratory: Normal respiratory effort.  No retractions. Gastrointestinal: Soft with left mid and lower quadrant abdominal tenderness.  No distention.  Genitourinary: No flank tenderness. Musculoskeletal:  Extremities warm and well perfused.  Neurologic:  Normal speech and language. No gross focal neurologic deficits are appreciated.  Skin:  Skin is warm and dry. No rash noted. Psychiatric: Mood and affect are normal. Speech and behavior are normal.  ____________________________________________   LABS (all labs ordered are listed, but only abnormal results are displayed)  Labs Reviewed  COMPREHENSIVE METABOLIC PANEL - Abnormal; Notable for the following components:      Result Value   Potassium 3.4 (*)    Glucose, Bld 120 (*)    All other components within normal limits  CBC - Abnormal; Notable for the following components:   WBC 22.3 (*)    Platelets 493 (*)    All other components within normal limits  URINALYSIS, COMPLETE (UACMP) WITH MICROSCOPIC - Abnormal; Notable for the following components:   Color, Urine YELLOW (*)    APPearance CLEAR (*)    All other components within normal limits  LIPASE, BLOOD    ____________________________________________  EKG   ____________________________________________  RADIOLOGY  CT abdomen: Gallbladder wall thickening and cholelithiasis.  Concerning for possible cholecystitis. [place] RUQ: Gallbladder wall thickening and pericholecystic fluid with stone in gallbladder neck, concerning for acute cholecystitis  ____________________________________________   PROCEDURES  Procedure(s) performed: No  Procedures  Critical Care performed: No ____________________________________________   INITIAL IMPRESSION / ASSESSMENT AND PLAN / ED COURSE  Pertinent labs & imaging results that were available during my care of the patient were reviewed by me and considered in my medical decision making (see chart for details).  53 year old male with PMH as noted above presents with acute onset of nausea and vomiting this morning which has persisted throughout the day and into the evening.  He reports associated left-sided abdominal pain, but no diarrhea or fever.  On exam the patient is overall somewhat tired appearing but in no  acute distress.  Abdomen is soft with some left-sided tenderness.  His vital signs are normal except for hypertension.  He states he did not take his evening medications today.  I reviewed the past medical records in epic.  The patient has a history of CAD.  He was admitted with chest pain and EKG changes last November and left AMA.  He has no prior history of presentations for symptoms similar to today.  Overall presentation is most consistent with acute gastroenteritis or foodborne illness, however given the lack of diarrhea, the tenderness on exam and the elevated WBC count, I will obtain CT to rule out diverticulitis, colitis, or other concerning acute cause that would require antibiotics or further intervention.  We will give fluids and symptomatic treatment with Zofran and Bentyl.  ----------------------------------------- 4:39 AM on  10/25/2018 -----------------------------------------  CT showed gallbladder findings concerning for cholecystitis although no diverticulitis or colonic findings.  The CT findings were somewhat unexpected given the location of the patient's pain so I obtained an ultrasound to further evaluate.  This confirmed findings concerning for acute cholecystitis.  I consulted Dr. Earlene Plater from general surgery who evaluated the patient.  Given the patient's atypical presentation and the fact that he is on double antiplatelet therapy, Dr. Earlene Plater advises that the patient will likely be more appropriate for antibiotics and possible percutaneous cholecystostomy rather than emergent cholecystectomy.  He will admit the patient.  ____________________________________________   FINAL CLINICAL IMPRESSION(S) / ED DIAGNOSES  Final diagnoses:  Cholecystitis  Acute cholecystitis      NEW MEDICATIONS STARTED DURING THIS VISIT:  New Prescriptions   No medications on file     Note:  This document was prepared using Dragon voice recognition software and may include unintentional dictation errors.    Dionne Bucy, MD 10/25/18 [phone]

## 2018-10-25 NOTE — ED Notes (Signed)
Pt is back from ultrasound.

## 2018-10-25 NOTE — ED Notes (Signed)
Pt is back from medical imaging. 

## 2018-10-25 NOTE — Progress Notes (Signed)
[facility] SURGICAL PROGRESS NOTE  Hospital Day(s): 0.   Post op day(s):  Thomas Romero   Interval History: Patient seen and examined, no acute events since admission earlier this morning. Patient reports some improvement in his abdominal pain which remains in his LLQ. Nausea has improved without any complaints of fever, chills, or emesis. Has been NPO since admission. + Flatus.   Review of Systems:  Constitutional: denies fever, chills  Gastrointestinal: + abdominal pain (improved), denied N/V, or diarrhea/and bowel function as per interval history  Vital signs in last 24 hours: [min-max] current  Temp:  [97.6 F (36.4 C)-97.7 F (36.5 C)] 97.6 F (36.4 C) (02/18 0808) Pulse Rate:  [63-75] 68 (02/18 0808) Resp:  [16-18] 17 (02/18 0808) BP: (145-173)/(75-96) 146/89 (02/18 0808) SpO2:  [94 %-99 %] 97 % (02/18 0808) Weight:  [63.5 kg] 63.5 kg (02/17 2144)     Height: 5\' 10"  (177.8 cm) Weight: 63.5 kg BMI (Calculated): 20.09   Intake/Output this shift:  No intake/output data recorded.     Physical Exam:  Constitutional: alert, cooperative and no distress  Respiratory: breathing non-labored at rest  Gastrointestinal: soft, mild LLQ pain, no RUQ tenderness, and non-distended, no rebound/guarding   Labs:  CBC Latest Ref Rng & Units 10/24/2018 09/28/2018 07/25/2018  WBC 4.0 - 10.5 K/uL 22.3(H) 7.3 10.9(H)  Hemoglobin 13.0 - 17.0 g/dL 44.9 67.5 91.6  Hematocrit 39.0 - 52.0 % 43.9 45.4 40.5  Platelets 150 - 400 K/uL 493(H) 232 171   CMP Latest Ref Rng & Units 10/24/2018 09/28/2018 07/25/2018  Glucose 70 - 99 mg/dL 384(Y) 90 659(D)  BUN 6 - 20 mg/dL 6 7 15   Creatinine 0.61 - 1.24 mg/dL 3.57 0.17 7.93  Sodium 135 - 145 mmol/L 139 140 138  Potassium 3.5 - 5.1 mmol/L 3.4(L) 4.1 3.3(L)  Chloride 98 - 111 mmol/L 102 103 106  CO2 22 - 32 mmol/L 29 22 26   Calcium 8.9 - 10.3 mg/dL 9.1 9.7 9.0(Z)  Total Protein 6.5 - 8.1 g/dL 7.8 6.9 0.0(P)  Total Bilirubin 0.3 - 1.2 mg/dL 0.8 0.3  0.8  Alkaline Phos 38 - 126 U/L 93 103 74  AST 15 - 41 U/L 22 26 72(H)  ALT 0 - 44 U/L 23 42 22    Imaging studies: No new pertinent imaging studies   Assessment/Plan: (ICD-10's: R10.32, K81.0) 53 y.o. male with mild improvement in LLQ abdominal pain which appears more consistent with clinical presentation of diverticulitis however was found to have mild cholecystitis by [place] which is complicated by pertinent comorbidities including HLD, CAD s/p PCI with drug-eluting stent placement for NSTEMI (07/2018) on chronic dual antiplatelet therapy, GERD with gastric PUD, generalized anxiety disorder, bipolar disorder, panic disdoer, and chronic ongoing tobacco abuse (smoking). .   - Start clear liquid diet, continue IVF, IV ABx (zosyn)  - Monitor abdominal examination  - pain control prn, antiemetics prn  - Will recheck CBC/CMP this afternoon and tomorrow morning  - Medical management of comorbidities  - DVT Prophylaxis    All of the above findings and recommendations were discussed with the patient, and the medical team, and all of patient's questions were answered to his expressed satisfaction.  -- Lynden Oxford, PA-C [facility] 10/25/2018, 9:06 AM [phone] M-F: 7am - 4pm

## 2018-10-26 DIAGNOSIS — K5732 Diverticulitis of large intestine without perforation or abscess without bleeding: Secondary | ICD-10-CM | POA: Diagnosis not present

## 2018-10-26 DIAGNOSIS — R1032 Left lower quadrant pain: Secondary | ICD-10-CM | POA: Diagnosis not present

## 2018-10-26 DIAGNOSIS — K5792 Diverticulitis of intestine, part unspecified, without perforation or abscess without bleeding: Secondary | ICD-10-CM

## 2018-10-26 LAB — BASIC METABOLIC PANEL
Anion gap: 7 (ref 5–15)
BUN: 8 mg/dL (ref 6–20)
CHLORIDE: 103 mmol/L (ref 98–111)
CO2: 28 mmol/L (ref 22–32)
Calcium: 8.7 mg/dL — ABNORMAL LOW (ref 8.9–10.3)
Creatinine, Ser: 0.82 mg/dL (ref 0.61–1.24)
GFR calc Af Amer: >60 mL/min (ref >60)
GFR calc non Af Amer: >60 mL/min (ref >60)
Glucose, Bld: 99 mg/dL (ref 70–99)
POTASSIUM: 3.2 mmol/L — AB (ref 3.5–5.1)
Sodium: 138 mmol/L (ref 135–145)

## 2018-10-26 LAB — CBC WITH DIFFERENTIAL/PLATELET
ABS IMMATURE GRANULOCYTES: 0.06 10*3/uL (ref 0.00–0.07)
Basophils Absolute: 0.1 10*3/uL (ref 0.0–0.1)
Basophils Relative: 1 %
Eosinophils Absolute: 0.2 10*3/uL (ref 0.0–0.5)
Eosinophils Relative: 1 %
HCT: 38.6 % — ABNORMAL LOW (ref 39.0–52.0)
Hemoglobin: 13.1 g/dL (ref 13.0–17.0)
Immature Granulocytes: 1 %
Lymphocytes Relative: 14 %
Lymphs Abs: 1.7 10*3/uL (ref 0.7–4.0)
MCH: 31 pg (ref 26.0–34.0)
MCHC: 33.9 g/dL (ref 30.0–36.0)
MCV: 91.5 fL (ref 80.0–100.0)
Monocytes Absolute: 0.9 10*3/uL (ref 0.1–1.0)
Monocytes Relative: 7 %
NEUTROS ABS: 9.3 10*3/uL — AB (ref 1.7–7.7)
Neutrophils Relative %: 76 %
Platelets: 379 10*3/uL (ref 150–400)
RBC: 4.22 MIL/uL (ref 4.22–5.81)
RDW: 13 % (ref 11.5–15.5)
WBC: 12.1 10*3/uL — ABNORMAL HIGH (ref 4.0–10.5)
nRBC: 0 % (ref 0.0–0.2)

## 2018-10-26 MED ORDER — CIPROFLOXACIN HCL 500 MG PO TABS: 500.0000 mg | ORAL_TABLET | Freq: Two times a day (BID) | ORAL | 0 refills | Status: AC

## 2018-10-26 MED ORDER — METRONIDAZOLE 500 MG PO TABS: 500.0000 mg | ORAL_TABLET | Freq: Three times a day (TID) | ORAL | 0 refills | Status: AC

## 2018-10-26 NOTE — Progress Notes (Signed)
Discharge instructions reviewed with patient including followup visits and new medications.  Understanding was verbalized and all questions were answered.  IV removed without complication; patient tolerated well.  Patient discharged home ambulatory in stable condition. 

## 2018-10-26 NOTE — Discharge Summary (Signed)
[facility] SURGICAL DISCHARGE SUMMARY (cpt: [phone])  Patient ID: Thomas Romero MRN: [id number] DOB/AGE: [birth year] 52 y.o.  Admit date: 10/24/2018 Discharge date: 10/26/2018  Discharge Diagnoses Left Lower Quadrant Abdominal Pain Clinical Acute Diverticulitis  Consultants None  Procedures None  HPI: 53 y.o. male presented to [facility] ED 02/18 for abdominal pain. Patient reports he was awakened from sleep ~8 am yesterday morning due to nausea, which he attributes to sausage he hade for dinner the night prior to yesterday. Throughout the day, patient's nausea worsened, culminating yesterday afternoon with dry heaves, after which he says he developed moderate focal LLQ abdominal pain ~9 pm last night, which prompted him to seek further evaluation. He adamantly denies having experienced any current or prior RUQ, Right-sided, or epigastric abdominal pain and denies emesis, fever/chills, constipation, or diarrhea. He currently says his LLQ abdominal pain and has improved. Of note, patient remains on dual antiplatelet therapy for PCI with drug-eluting stent performed for NSTEMI within 3 months ago, though he currently denies any CP or SOB while at rest  Hospital Course: Although imaging studies were concerning for cholelithiasis and possible cholecystitis the patient denied any pain in his RUQ and examination corroborates however he did have significant LLQ pain and tenderness. The decision was made to treat like clinical diverticulitis. He was admitted to general surgery and he was made NPO and started on IV ABx (zosyn). His pain and leukocytosis improved and he was discharged home on hospital day 2 with PO Cipro/Flagyl for 10 days and follow up in 1 week.   Discharge Condition: Good   Physical Examination:  Constitutional: alert, cooperative and no distress  Respiratory: breathing non-labored at rest  Gastrointestinal: soft, mild LLQ pain, no RUQ tenderness, and non-distended, no  rebound/guarding   Allergies as of 10/26/2018      Reactions   Ibuprofen Other (See Comments)   Gastric ulcers      Medication List    TAKE these medications   aspirin 81 MG chewable tablet Chew 1 tablet (81 mg total) by mouth daily.   atorvastatin 80 MG tablet Commonly known as:  LIPITOR Take 1 tablet (80 mg total) by mouth daily at 6 PM.   ciprofloxacin 500 MG tablet Commonly known as:  CIPRO Take 1 tablet (500 mg total) by mouth 2 (two) times daily for 10 days.   ezetimibe 10 MG tablet Commonly known as:  ZETIA Take 1 tablet (10 mg total) by mouth daily.   metroNIDAZOLE 500 MG tablet Commonly known as:  FLAGYL Take 1 tablet (500 mg total) by mouth 3 (three) times daily for 10 days.   nitroGLYCERIN 0.4 MG SL tablet Commonly known as:  NITROSTAT Place 1 tablet (0.4 mg total) under the tongue every 5 (five) minutes as needed for chest pain.   pantoprazole 40 MG tablet Commonly known as:  PROTONIX Take 1 tablet (40 mg total) by mouth daily.   ticagrelor 90 MG Tabs tablet Commonly known as:  BRILINTA Take 1 tablet (90 mg total) by mouth 2 (two) times daily.        Follow-up Information    Ancil Linsey, MD. Schedule an appointment as soon as possible for a visit in 1 week(s).   Specialty:  General Surgery Why:  1 week, hosp follow up, diverticulitis Contact information: [address] [place] [address] [phone]            -- Thomas Romero , PA-C [facility]  10/26/2018, 3:14 PM  [phone] M-F: 7am - 4pm

## 2018-10-27 LAB — HIV ANTIBODY (ROUTINE TESTING W REFLEX): HIV SCREEN 4TH GENERATION: NONREACTIVE

## 2018-11-03 ENCOUNTER — Ambulatory Visit: Payer: Medicare HMO | Admitting: Surgery

## 2018-11-03 ENCOUNTER — Encounter: Payer: Self-pay | Admitting: Surgery

## 2018-11-03 ENCOUNTER — Other Ambulatory Visit: Payer: Self-pay

## 2018-11-15 ENCOUNTER — Ambulatory Visit (INDEPENDENT_AMBULATORY_CARE_PROVIDER_SITE_OTHER): Payer: Medicare HMO | Admitting: Surgery

## 2018-11-15 ENCOUNTER — Encounter: Payer: Self-pay | Admitting: Surgery

## 2018-11-15 ENCOUNTER — Other Ambulatory Visit: Payer: Self-pay

## 2018-11-15 VITALS — BP 140/82 | HR 98 | Temp 97.5°F | Resp 14 | Ht 70.0 in | Wt 139.0 lb

## 2018-11-15 DIAGNOSIS — K5732 Diverticulitis of large intestine without perforation or abscess without bleeding: Secondary | ICD-10-CM

## 2018-11-15 NOTE — Progress Notes (Signed)
Surgical Clinic Progress/Follow-up Note   HPI:  53 y.o. Male presents to clinic for follow-up evaluation s/p recent [facility] admission for LLQ abdominal pain and leukocytosis clinically consistent with acute sigmoid colonic diverticulitis despite RUQ ultrasound suggestive of mild cholecystitis without any RUQ abdominal pain or even history of RUQ abdominal pain, recent or otherwise. Patient reports he completed the course of antibiotics he was prescribed and denies any LLQ abdominal pain either. He describes he has been tolerating a regular diet, denies constipation/diarrhea or blood per rectum, refuses smoking or alcohol cessation, and refuses a colonoscopy under any circumstances.  Review of Systems:  Constitutional: denies any other weight loss, fever, chills, or sweats  Eyes: denies any other vision changes, history of eye injury  ENT: denies sore throat, hearing problems  Respiratory: denies shortness of breath, wheezing  Cardiovascular: denies chest pain, palpitations  Gastrointestinal: abdominal pain, N/V, or diarrhea Musculoskeletal: denies any other joint pains or cramps  Skin: Denies any other rashes or skin discolorations  Neurological: denies any other headache, dizziness, weakness  Psychiatric: denies any other depression, anxiety  All other review of systems: otherwise negative   Vital Signs:  BP 140/82   Pulse 98   Temp (!) 97.5 F (36.4 C)   Resp 14   Ht 5\' 10"  (1.778 m)   Wt 139 lb (63 kg)   BMI 19.94 kg/m    Physical Exam:  Constitutional:  -- Normal body habitus  -- Awake, alert, and oriented x3  Eyes:  -- Pupils equally round and reactive to light  -- No scleral icterus  Ear, nose, throat:  -- No jugular venous distension  -- No nasal drainage, bleeding Pulmonary:  -- No crackles -- Equal breath sounds bilaterally -- Breathing non-labored at rest Cardiovascular:  -- S1, S2 present  -- No pericardial rubs  Gastrointestinal:  -- Soft, nontender,  non-distended, no guarding/rebound  -- No abdominal masses appreciated, pulsatile or otherwise  Musculoskeletal / Integumentary:  -- Wounds or skin discoloration: None appreciated  -- Extremities: B/L UE and LE FROM, hands and feet warm, no edema  Neurologic:  -- Motor function: intact and symmetric  -- Sensation: intact and symmetric   Laboratory studies:  CBC Latest Ref Rng & Units 10/26/2018 10/25/2018 10/24/2018  WBC 4.0 - 10.5 K/uL 12.1(H) 16.6(H) 22.3(H)  Hemoglobin 13.0 - 17.0 g/dL 10.0 71.2 19.7  Hematocrit 39.0 - 52.0 % 38.6(L) 41.6 43.9  Platelets 150 - 400 K/uL 379 423(H) 493(H)   CMP Latest Ref Rng & Units 10/26/2018 10/25/2018 10/24/2018  Glucose 70 - 99 mg/dL 99 588(T) 254(D)  BUN 6 - 20 mg/dL 8 8 6   Creatinine 0.61 - 1.24 mg/dL 8.26 4.15 8.30  Sodium 135 - 145 mmol/L 138 137 139  Potassium 3.5 - 5.1 mmol/L 3.2(L) 3.8 3.4(L)  Chloride 98 - 111 mmol/L 103 101 102  CO2 22 - 32 mmol/L 28 30 29   Calcium 8.9 - 10.3 mg/dL 9.4(M) 9.1 9.1  Total Protein 6.5 - 8.1 g/dL - - 7.8  Total Bilirubin 0.3 - 1.2 mg/dL - - 0.8  Alkaline Phos 38 - 126 U/L - - 93  AST 15 - 41 U/L - - 22  ALT 0 - 44 U/L - - 23   Imaging: No new pertinent imaging studies available for review at this time   Assessment:  53 y.o. yo Male with a problem list including...  Patient Active Problem List   Diagnosis Date Noted  . Left lower quadrant abdominal pain 10/25/2018  .  Non-ST elevation (NSTEMI) myocardial infarction (HCC) 07/25/2018  . Anxiety 07/25/2018  . Depression 07/25/2018    presents to clinic for follow-up evaluation, s/p recent [facility] admission for LLQ abdominal pain and leukocytosis clinically consistent with acute sigmoid colonic diverticulitis despite RUQ ultrasound suggestive of mild cholecystitis without any RUQ abdominal pain or even history of RUQ abdominal pain, recent or otherwise.   - natural history of colonic diverticulitis discussed  - discussed signs and symptoms of cholecystitis,  diverticulitis, and COPD  - maintain hydration with high fiber diet +/- stool softener as needed to reduce constipation  - overdue screening colonoscopy and cessation of tobacco abuse (smoking) and alcohol abuse were strongly recommended  - patient adamantly refuses colonoscopy or similar and refuses cessation or reduction of smoking or alcohol consumption  - return to clinic as needed, instructed to call office if any questions or concerns  All of the above recommendations were discussed with the patient, and all of patient's questions were answered to his expressed satisfaction.  -- Scherrie Gerlach Earlene Plater, MD, RPVI [place]: [facility] General Surgery - Partnering for exceptional care. Office: [phone]

## 2018-11-16 ENCOUNTER — Encounter: Payer: Self-pay | Admitting: Surgery

## 2018-12-09 ENCOUNTER — Telehealth: Payer: Self-pay | Admitting: Cardiovascular Disease

## 2018-12-09 DIAGNOSIS — E785 Hyperlipidemia, unspecified: Secondary | ICD-10-CM

## 2018-12-09 DIAGNOSIS — Z79899 Other long term (current) drug therapy: Secondary | ICD-10-CM

## 2018-12-09 NOTE — Telephone Encounter (Signed)
Lab orders changed.  

## 2018-12-09 NOTE — Telephone Encounter (Signed)
Lab appt cancelled in Clinic  [facility] aware to [facility] for fasting labs Please change orders

## 2018-12-12 ENCOUNTER — Other Ambulatory Visit: Payer: Medicare HMO

## 2018-12-12 ENCOUNTER — Other Ambulatory Visit
Admission: RE | Admit: 2018-12-12 | Discharge: 2018-12-12 | Disposition: A | Discharge status: Home / Self Care | Payer: Medicare HMO | Source: Ambulatory Visit | Attending: Cardiovascular Disease | Admitting: Cardiovascular Disease

## 2018-12-12 ENCOUNTER — Other Ambulatory Visit: Payer: Self-pay

## 2018-12-12 DIAGNOSIS — I214 Non-ST elevation (NSTEMI) myocardial infarction: Secondary | ICD-10-CM | POA: Insufficient documentation

## 2018-12-12 LAB — HEPATIC FUNCTION PANEL
ALT: 35 U/L (ref 0–44)
AST: 28 U/L (ref 15–41)
Albumin: 4.1 g/dL (ref 3.5–5.0)
Alkaline Phosphatase: 85 U/L (ref 38–126)
Bilirubin, Direct: 0.1 mg/dL (ref 0.0–0.2)
Indirect Bilirubin: 0.7 mg/dL (ref 0.3–0.9)
Total Bilirubin: 0.8 mg/dL (ref 0.3–1.2)
Total Protein: 7.1 g/dL (ref 6.5–8.1)

## 2018-12-12 LAB — LIPID PANEL
Cholesterol: 129 mg/dL (ref 0–200)
HDL: 25 mg/dL — ABNORMAL LOW (ref >40)
LDL Cholesterol: 83 mg/dL (ref 0–99)
Total CHOL/HDL Ratio: 5.2 RATIO
Triglycerides: 107 mg/dL (ref <150)
VLDL: 21 mg/dL (ref 0–40)

## 2018-12-22 ENCOUNTER — Telehealth: Payer: Self-pay | Admitting: *Deleted

## 2018-12-22 NOTE — Telephone Encounter (Signed)
Ok continue same meds for now and we can consider a PCSK9 I down the road. LDL is close to target and much better than before.

## 2018-12-22 NOTE — Telephone Encounter (Signed)
Patient made aware of results and verbalized understanding.  The patient is currently taking Atorvastatin 80 mg daily and is already on Zetia 10 mg daily. Zetia was started on 09/29/2018.  Message routed to the provider for any recommendations.    Notes recorded by Antonieta Iba, MD on 12/21/2018 at 10:11 PM EDT Thomas Romero patient LDL still above goal Would recommend he start zetia 10 mg daily to achieve LDL <70, Preferably <60

## 2018-12-22 NOTE — Telephone Encounter (Signed)
Attempted to reach the patient but there was no answer and voicemail was not available.

## 2018-12-27 NOTE — Telephone Encounter (Signed)
Attempted to reach the patient. There was no answer and no voicemail was available.

## 2018-12-29 NOTE — Telephone Encounter (Signed)
Left a message for the patient to call back.   This is the third attempt. The encounter will be closed.

## 2019-01-10 ENCOUNTER — Other Ambulatory Visit: Payer: Self-pay

## 2019-01-10 MED ORDER — ATORVASTATIN CALCIUM 80 MG PO TABS: 80.0000 mg | ORAL_TABLET | Freq: Every day | ORAL | 6 refills | Status: DC

## 2019-01-10 MED ORDER — TICAGRELOR 90 MG PO TABS: 90.0000 mg | ORAL_TABLET | Freq: Two times a day (BID) | ORAL | 6 refills | Status: DC

## 2019-01-10 MED ORDER — PANTOPRAZOLE SODIUM 40 MG PO TBEC: 40.0000 mg | DELAYED_RELEASE_TABLET | Freq: Every day | ORAL | 6 refills | Status: DC

## 2019-02-27 ENCOUNTER — Telehealth: Payer: Self-pay

## 2019-02-27 NOTE — Telephone Encounter (Signed)
Virtual Visit Pre-Appointment Phone Call  "Thomas Romero, I am calling you today to discuss your upcoming appointment. We are currently trying to limit exposure to the virus that causes COVID-19 by seeing patients at home rather than in the office."  1. "What is the BEST phone number to call the day of the visit?" - include this in appointment notes  2. "Do you have or have access to (through a family member/friend) a smartphone with video capability that we can use for your visit?" a. If yes - list this number in appt notes as "cell" (if different from BEST phone #) and list the appointment type as a VIDEO visit in appointment notes b. If no - list the appointment type as a PHONE visit in appointment notes  3. Confirm consent - "In the setting of the current Covid19 crisis, you are scheduled for a video visit with your provider on 04/04/2019 at 4:20PM.  Just as we do with many in-office visits, in order for you to participate in this visit, we must obtain consent.  If you'd like, I can send this to your mychart (if signed up) or email for you to review.  Otherwise, I can obtain your verbal consent now.  All virtual visits are billed to your insurance company just like a normal visit would be.  By agreeing to a virtual visit, we'd like you to understand that the technology does not allow for your provider to perform an examination, and thus may limit your provider's ability to fully assess your condition. If your provider identifies any concerns that need to be evaluated in person, we will make arrangements to do so.  Finally, though the technology is pretty good, we cannot assure that it will always work on either your or our end, and in the setting of a video visit, we may have to convert it to a phone-only visit.  In either situation, we cannot ensure that we have a secure connection.  Are you willing to proceed?" STAFF: Did the patient verbally acknowledge consent to telehealth visit? Document YES/NO  here: YES  4. Advise patient to be prepared - "Two hours prior to your appointment, go ahead and check your blood pressure, pulse, oxygen saturation, and your weight (if you have the equipment to check those) and write them all down. When your visit starts, your provider will ask you for this information. If you have an Apple Watch or Kardia device, please plan to have heart rate information ready on the day of your appointment. Please have a pen and paper handy nearby the day of the visit as well."  5. Give patient instructions for MyChart download to smartphone OR Doximity/Doxy.me as below if video visit (depending on what platform provider is using)  6. Inform patient they will receive a phone call 15 minutes prior to their appointment time (may be from unknown caller ID) so they should be prepared to answer    TELEPHONE CALL NOTE  MARSHA HILLMAN has been deemed a candidate for a follow-up tele-health visit to limit community exposure during the Covid-19 pandemic. I spoke with the patient via phone to ensure availability of phone/video source, confirm preferred email & phone number, and discuss instructions and expectations.  I reminded Thomas Romero to be prepared with any vital sign and/or heart rhythm information that could potentially be obtained via home monitoring, at the time of his visit. I reminded Thomas Romero to expect a phone call prior to his visit.  Thomas SamsBrandy L Newcomer Romero 02/27/2019 3:28 PM   INSTRUCTIONS FOR DOWNLOADING THE MYCHART APP TO SMARTPHONE  - The patient must first make sure to have activated MyChart and know their login information - If Apple, go to [employer] Store and type in MyChart in the search bar and download the app. If Android, ask patient to go to [employer] Play Store and type in [facility] in the search bar and download the app. The app is free but as with any other app downloads, their phone may require them to verify saved payment information or Apple/Android  password.  - The patient will need to then log into the app with their MyChart username and password, and select [place] as their healthcare provider to link the account. When it is time for your visit, go to the MyChart app, find appointments, and click Begin Video Visit. Be sure to Select Allow for your device to access the Microphone and Camera for your visit. You will then be connected, and your provider will be with you shortly.  **If they have any issues connecting, or need assistance please contact MyChart service desk [phone])**  **If using a computer, in order to ensure the best quality for their visit they will need to use either of the following Internet Browsers: [employer] Edge, or Google Chrome**  IF USING DOXIMITY or DOXY.ME - The patient will receive a link just prior to their visit by text.     FULL LENGTH CONSENT FOR TELE-HEALTH VISIT   I hereby voluntarily request, consent and authorize [facility] and its employed or contracted physicians, physician assistants, nurse practitioners or other licensed health care professionals (the Practitioner), to provide me with telemedicine health care services (the "Services") as deemed necessary by the treating Practitioner. I acknowledge and consent to receive the Services by the Practitioner via telemedicine. I understand that the telemedicine visit will involve communicating with the Practitioner through live audiovisual communication technology and the disclosure of certain medical information by electronic transmission. I acknowledge that I have been given the opportunity to request an in-person assessment or other available alternative prior to the telemedicine visit and am voluntarily participating in the telemedicine visit.  I understand that I have the right to withhold or withdraw my consent to the use of telemedicine in the course of my care at any time, without affecting my right to future care or treatment,  and that the Practitioner or I may terminate the telemedicine visit at any time. I understand that I have the right to inspect all information obtained and/or recorded in the course of the telemedicine visit and may receive copies of available information for a reasonable fee.  I understand that some of the potential risks of receiving the Services via telemedicine include:  Marland Kitchen. Delay or interruption in medical evaluation due to technological equipment failure or disruption; . Information transmitted may not be sufficient (e.g. poor resolution of images) to allow for appropriate medical decision making by the Practitioner; and/or  . In rare instances, security protocols could fail, causing a breach of personal health information.  Furthermore, I acknowledge that it is my responsibility to provide information about my medical history, conditions and care that is complete and accurate to the best of my ability. I acknowledge that Practitioner's advice, recommendations, and/or decision may be based on factors not within their control, such as incomplete or inaccurate data provided by me or distortions of diagnostic images or specimens that may result from electronic transmissions. I understand that  the practice of medicine is not an exact science and that Practitioner makes no warranties or guarantees regarding treatment outcomes. I acknowledge that I will receive a copy of this consent concurrently upon execution via email to the email address I last provided but may also request a printed copy by calling the office of [place].    I understand that my insurance will be billed for this visit.   I have read or had this consent read to me. . I understand the contents of this consent, which adequately explains the benefits and risks of the Services being provided via telemedicine.  . I have been provided ample opportunity to ask questions regarding this consent and the Services and have had my questions  answered to my satisfaction. . I give my informed consent for the services to be provided through the use of telemedicine in my medical care  By participating in this telemedicine visit I agree to the above.

## 2019-04-04 ENCOUNTER — Other Ambulatory Visit: Payer: Self-pay

## 2019-04-04 ENCOUNTER — Telehealth (INDEPENDENT_AMBULATORY_CARE_PROVIDER_SITE_OTHER): Payer: Medicare HMO | Admitting: Cardiovascular Disease

## 2019-04-04 ENCOUNTER — Encounter: Payer: Self-pay | Admitting: Cardiovascular Disease

## 2019-04-04 VITALS — Ht 70.0 in | Wt 145.0 lb

## 2019-04-04 DIAGNOSIS — I2510 Atherosclerotic heart disease of native coronary artery without angina pectoris: Secondary | ICD-10-CM | POA: Diagnosis not present

## 2019-04-04 DIAGNOSIS — E785 Hyperlipidemia, unspecified: Secondary | ICD-10-CM

## 2019-04-04 DIAGNOSIS — Z720 Tobacco use: Secondary | ICD-10-CM

## 2019-04-04 NOTE — Progress Notes (Signed)
Virtual Visit via Video Note   This visit type was conducted due to national recommendations for restrictions regarding the COVID-19 Pandemic (e.g. social distancing) in an effort to limit this patient's exposure and mitigate transmission in our community.  Due to his co-morbid illnesses, this patient is at least at moderate risk for complications without adequate follow up.  This format is felt to be most appropriate for this patient at this time.  All issues noted in this document were discussed and addressed.  A limited physical exam was performed with this format.  Please refer to the patient's chart for his consent to telehealth for [facility].   Date:  04/04/2019   ID:  Thomas Romero, DOB 1965-10-21, MRN [id number]  Patient Location: Home Provider Location: Office  PCP:  [facility]  Cardiologist:  Lorine Bears , MD  Electrophysiologist:  None   Evaluation Performed:  Follow-Up Visit  Chief Complaint: Doing well with no complaints  History of Present Illness:    Thomas Romero is a 53 y.o. male who was seen via video visit for follow-up regarding coronary artery disease.  He has prolonged history of tobacco use, previous gastric ulcer, anxiety and depression and hyperlipidemia. He had inferior ST elevation myocardial infarction in November 2019. Cardiac catheterization showed 99% stenosis in the mid RCA which was the culprit which was treated successfully with PCI and drug-eluting stent placement.  There was moderate mid left circumflex disease that was left to be treated medically.  He has been doing well with no recent chest pain, shortness of breath or palpitations.  He takes his medications regularly.  Unfortunately, he continues to smoke 1-1/2 pack/day and he reports inability to quit.  He started smoking when he was 53 years old.   The patient does not have symptoms concerning for COVID-19 infection (fever, chills, cough, or new shortness of  breath).    Past Medical History:  Diagnosis Date  . Coronary artery disease    Non-ST elevation myocardial infarction in November 2019.  Cardiac catheterization showed 99% stenosis in the mid RCA treated successfully with PCI and drug-eluting stent placement in moderate mid left circumflex disease.  . Gastric ulcer   . Hyperlipidemia   . Manic depression (HCC)   . Panic attacks   . Tobacco use    Past Surgical History:  Procedure Laterality Date  . CORONARY/GRAFT ACUTE MI REVASCULARIZATION N/A 07/25/2018   Procedure: Coronary/Graft Acute MI Revascularization;  Surgeon: Iran Ouch,  A, MD;  Location: [facility] INVASIVE CV LAB;  Service: Cardiovascular;  Laterality: N/A;  . LEFT HEART CATH AND CORONARY ANGIOGRAPHY N/A 07/25/2018   Procedure: LEFT HEART CATH AND CORONARY ANGIOGRAPHY;  Surgeon: Iran Ouch,  A, MD;  Location: [facility] INVASIVE CV LAB;  Service: Cardiovascular;  Laterality: N/A;  . SMALL INTESTINE SURGERY       Current Meds  Medication Sig  . aspirin 81 MG chewable tablet Chew 1 tablet (81 mg total) by mouth daily.  Marland Kitchen. atorvastatin (LIPITOR) 80 MG tablet Take 1 tablet (80 mg total) by mouth daily at 6 PM.  . ezetimibe (ZETIA) 10 MG tablet Take 1 tablet (10 mg total) by mouth daily.  . nitroGLYCERIN (NITROSTAT) 0.4 MG SL tablet Place 1 tablet (0.4 mg total) under the tongue every 5 (five) minutes as needed for chest pain.  . pantoprazole (PROTONIX) 40 MG tablet Take 1 tablet (40 mg total) by mouth daily.  . ticagrelor (BRILINTA) 90 MG TABS tablet Take 1 tablet (90 mg  total) by mouth 2 (two) times daily.     Allergies:   Ibuprofen   Social History   Tobacco Use  . Smoking status: Current Every Day Smoker    Packs/day: 2.00    Years: 36.00    Pack years: 72.00    Types: Cigarettes  . Smokeless tobacco: Never Used  . Tobacco comment: Patient 5 PPD; recently cut back to 1.5 daily  Substance Use Topics  . Alcohol use: Yes  . Drug use: No     Family Hx: The  patient's family history includes Heart attack in his brother, mother, and sister; Stroke in his father.  ROS:   Please see the history of present illness.     All other systems reviewed and are negative.   Prior CV studies:   The following studies were reviewed today:  Echocardiogram in December 2019: Normal ejection fraction with mild inferior wall hypokinesis.  Labs/Other Tests and Data Reviewed:    EKG:  No ECG reviewed.  Recent Labs: 07/25/2018: Magnesium 2.1 10/26/2018: BUN 8; Creatinine, Ser 0.82; Hemoglobin 13.1; Platelets 379; Potassium 3.2; Sodium 138 12/12/2018: ALT 35   Recent Lipid Panel Lab Results  Component Value Date/Time   CHOL 129 12/12/2018 08:58 AM   CHOL 149 09/28/2018 03:02 PM   TRIG 107 12/12/2018 08:58 AM   HDL 25 (L) 12/12/2018 08:58 AM   HDL 25 (L) 09/28/2018 03:02 PM   CHOLHDL 5.2 12/12/2018 08:58 AM   LDLCALC 83 12/12/2018 08:58 AM   LDLCALC 99 09/28/2018 03:02 PM   LDLDIRECT 105 (H) 09/28/2018 03:02 PM    Wt Readings from Last 3 Encounters:  04/04/19 145 lb (65.8 kg)  11/15/18 139 lb (63 kg)  10/24/18 140 lb (63.5 kg)     Objective:    Vital Signs:  Ht 5\' 10"  (1.778 m)   Wt 145 lb (65.8 kg)   BMI 20.81 kg/m    VITAL SIGNS:  reviewed GEN:  no acute distress EYES:  sclerae anicteric, EOMI - Extraocular Movements Intact RESPIRATORY:  normal respiratory effort, symmetric expansion SKIN:  no rash, lesions or ulcers. NEURO:  alert and oriented x 3, no obvious focal deficit PSYCH:  normal affect  ASSESSMENT & PLAN:    1.  Coronary artery disease involving native coronary arteries without angina: He is doing well overall with no anginal symptoms.  Continue medical therapy.  The plan is to use dual antiplatelet therapy for another 6 months and then discontinue Brilinta.   2.  Hyperlipidemia: Continue high-dose atorvastatin and Zetia.  Most recent lipid profile in April showed significant improvement in LDL from 1 52-83.  3.  Tobacco  use: I discussed with him the importance of smoking cessation.  COVID-19 Education: The signs and symptoms of COVID-19 were discussed with the patient and how to seek care for testing (follow up with PCP or arrange E-visit).  The importance of social distancing was discussed today.  Time:   Today, I have spent 8 minutes with the patient with telehealth technology discussing the above problems.     Medication Adjustments/Labs and Tests Ordered: Current medicines are reviewed at length with the patient today.  Concerns regarding medicines are outlined above.   Tests Ordered: No orders of the defined types were placed in this encounter.   Medication Changes: No orders of the defined types were placed in this encounter.   Follow Up:  In Person in 6 month(s)  Signed, Kathlyn Sacramento, MD  04/04/2019 4:20 PM    [facility]

## 2019-04-04 NOTE — Patient Instructions (Signed)
Medication Instructions:  Continue same medications If you need a refill on your cardiac medications before your next appointment, please call your pharmacy.   Lab work: None If you have labs (blood work) drawn today and your tests are completely normal, you will receive your results only by: . MyChart Message (if you have MyChart) OR . A paper copy in the mail If you have any lab test that is abnormal or we need to change your treatment, we will call you to review the results.  Testing/Procedures: None  Follow-Up: At [facility], you and your health needs are our priority.  As part of our continuing mission to provide you with exceptional heart care, we have created designated Provider Care Teams.  These Care Teams include your primary Cardiologist (physician) and Advanced Practice Providers (APPs -  Physician Assistants and Nurse Practitioners) who all work together to provide you with the care you need, when you need it. You will need a follow up appointment in 6 months.  Please call our office 2 months in advance to schedule this appointment.  You may see Chi Woodham, MD or one of the following Advanced Practice Providers on your designated Care Team:   Christopher Berge, NP Ryan Dunn, PA-C . Jacquelyn Visser, PA-C   

## 2019-07-07 ENCOUNTER — Other Ambulatory Visit: Payer: Self-pay | Admitting: Cardiovascular Disease

## 2019-09-20 ENCOUNTER — Other Ambulatory Visit: Payer: Self-pay

## 2019-09-20 MED ORDER — ATORVASTATIN CALCIUM 80 MG PO TABS: 80.0000 mg | ORAL_TABLET | Freq: Every day | ORAL | 0 refills | Status: DC

## 2019-10-03 IMAGING — CT CT ABD-PELV W/ CM
2 of 5 series · 15 of 46 positions shown, 17 images · IV contrast (omnipaque)
Comparison: Portable chest 07/24/2018.

CLINICAL DATA: 52-year-old male with abdominal pain and vomiting
since yesterday.

EXAM:
CT ABDOMEN AND PELVIS WITH CONTRAST
TECHNIQUE: Multidetector CT imaging of the abdomen and pelvis was performed
using the standard protocol following bolus administration of
intravenous contrast.
CONTRAST:  100mL OMNIPAQUE IOHEXOL 300 MG/ML  SOLN

[Series 2: routine abd/pel with · axial · 0.76mm/px · z∈[-662,-277]mm · 12 of 89 slices shown, 14 images]
[im 6/89  soft-tissue]
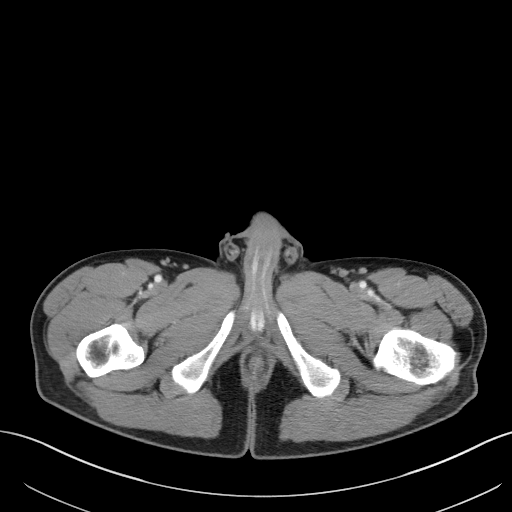
[im 6/89  bone]
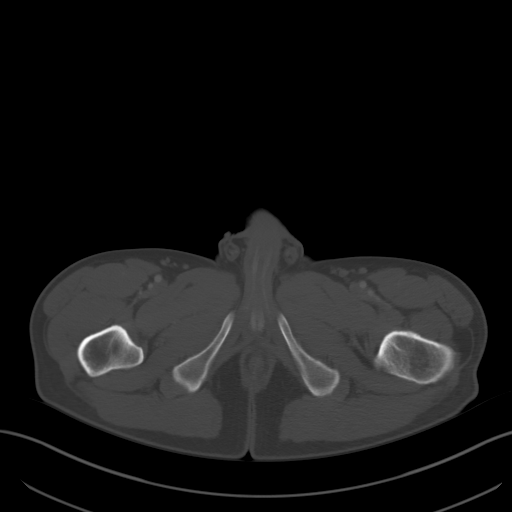
[im 16/89  soft-tissue]
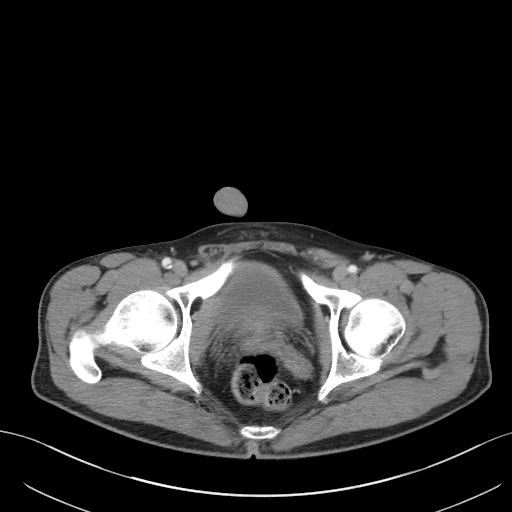
[im 21/89  soft-tissue]
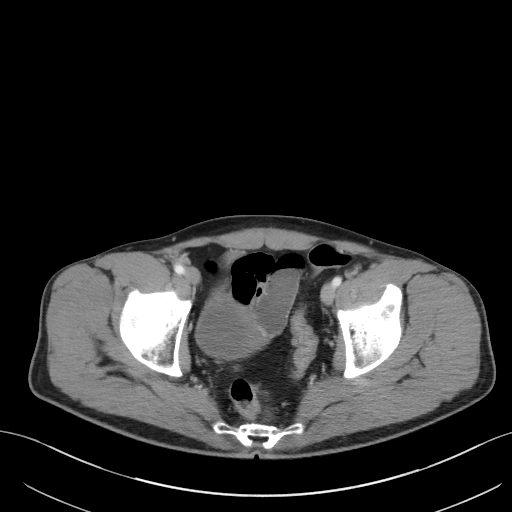
[im 26/89  soft-tissue]
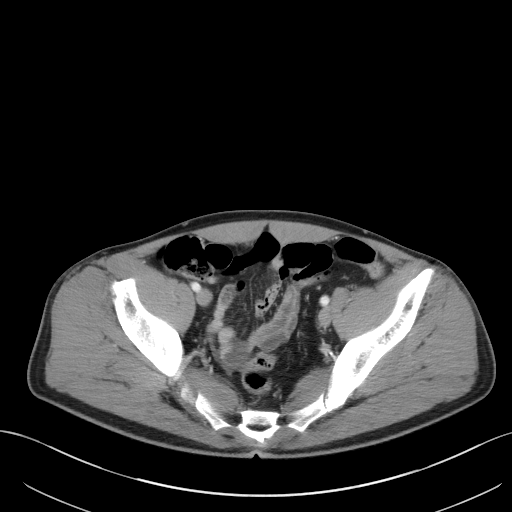
[im 37/89  soft-tissue]
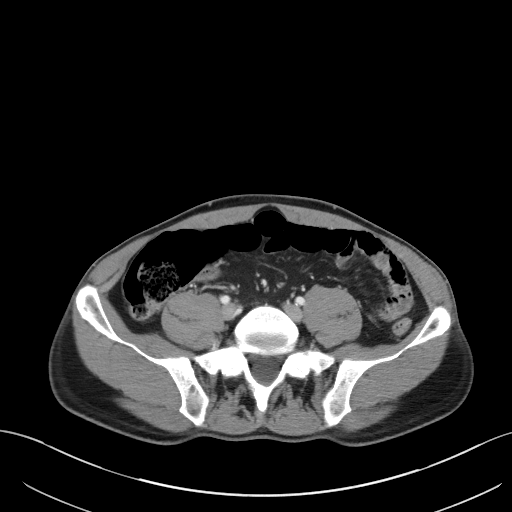
[im 42/89  soft-tissue]
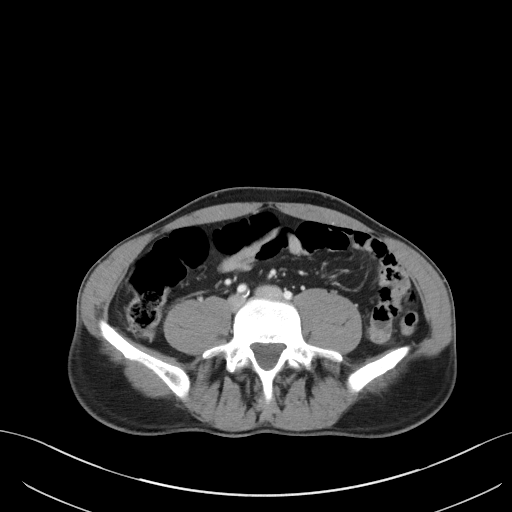
[im 47/89  soft-tissue]
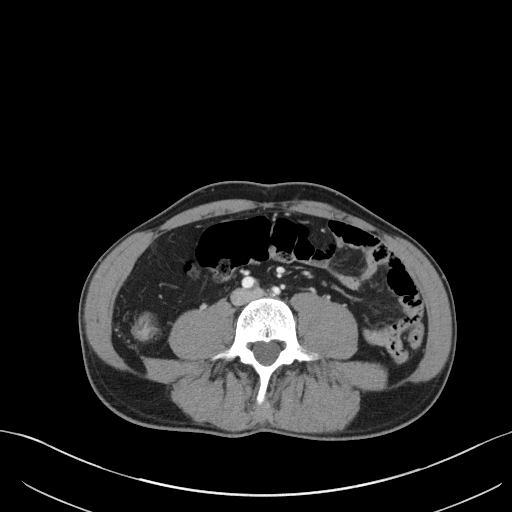
[im 57/89  soft-tissue]
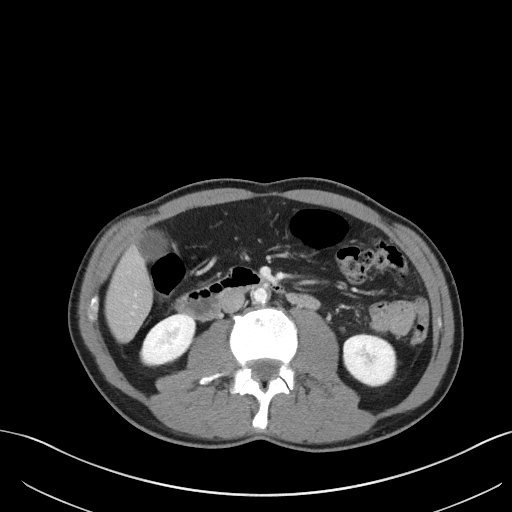
[im 63/89  soft-tissue]
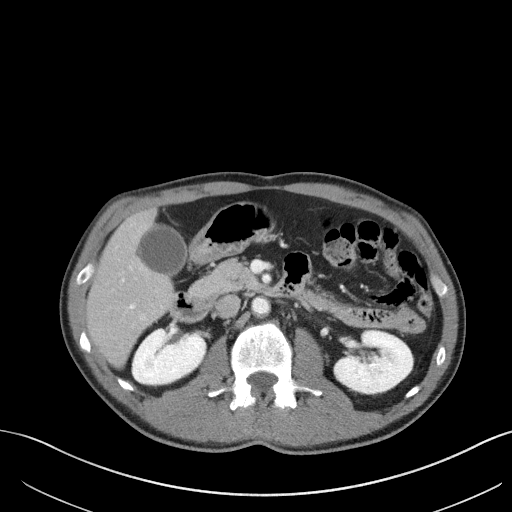
[im 63/89  bone]
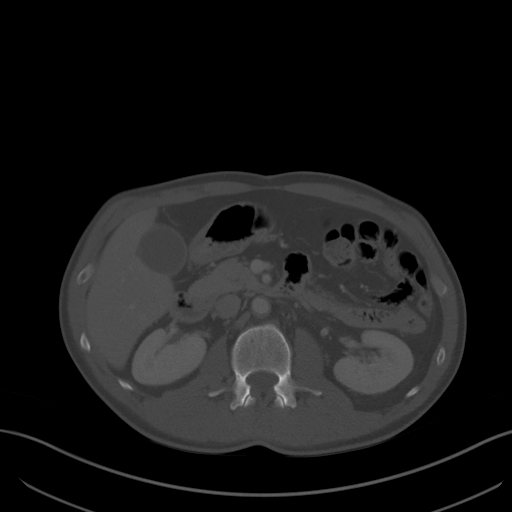
[im 68/89  soft-tissue]
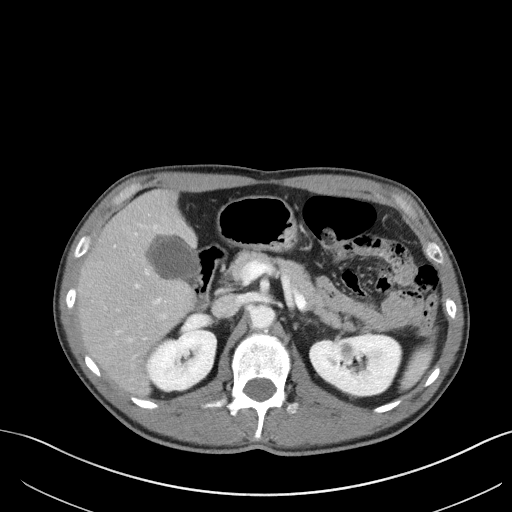
[im 78/89  soft-tissue]
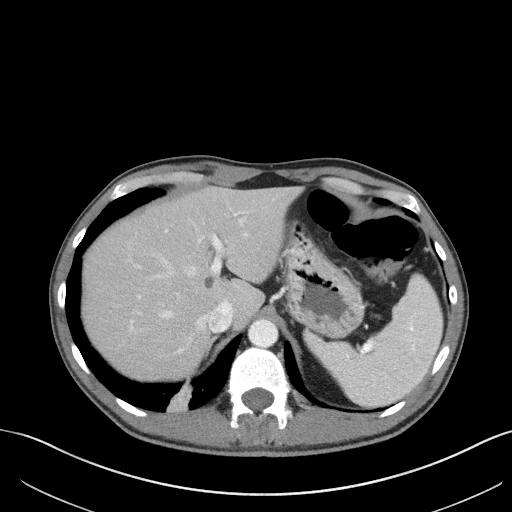
[im 83/89  soft-tissue]
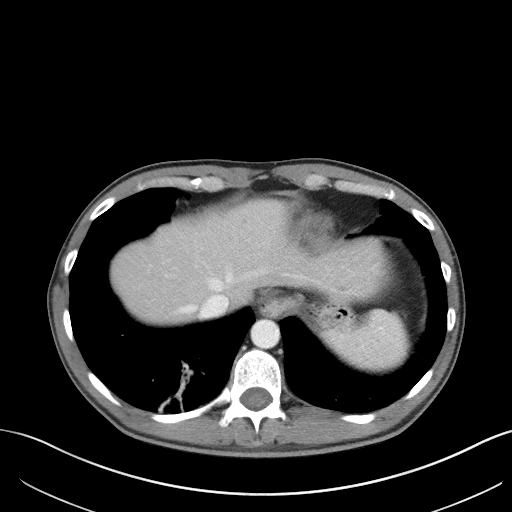

[Series 5: coronal st · coronal · 0.71mm/px · 3 of 71 slices shown]
[im 24/71  soft-tissue]
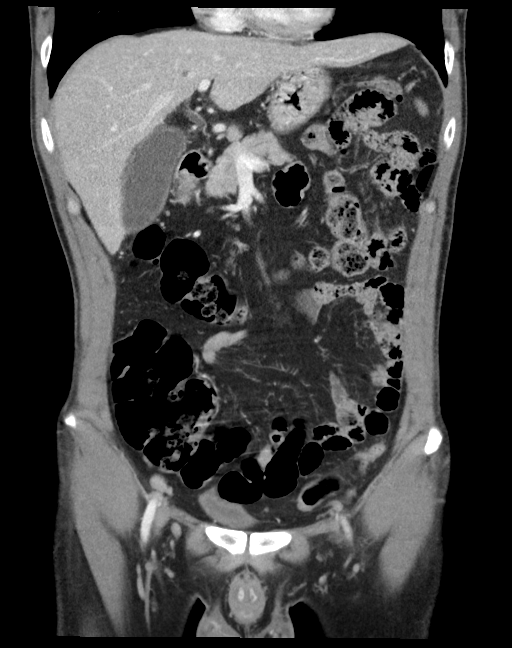
[im 32/71  soft-tissue]
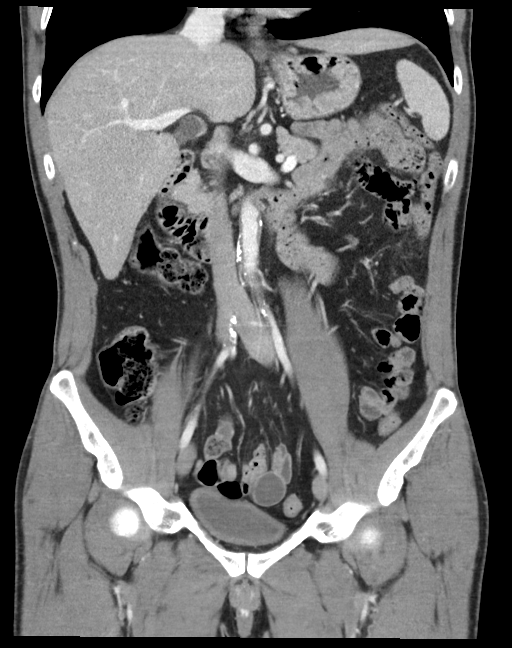
[im 39/71  soft-tissue]
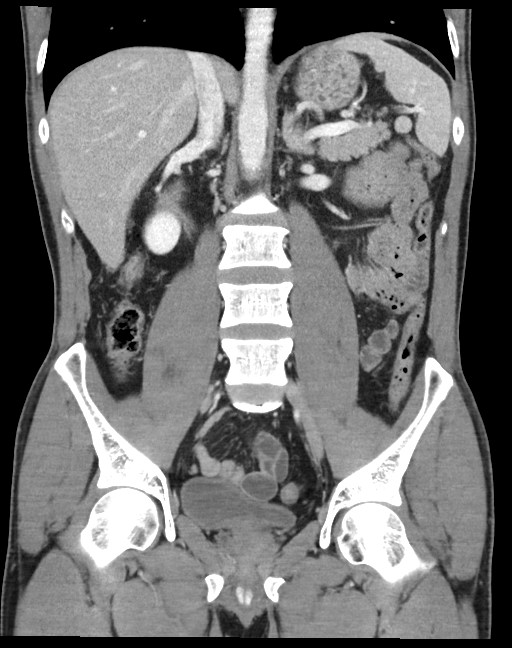

[15 of 46 positions shown; findings below may reference images not displayed]

FINDINGS: Lower chest: Dependent atelectasis at the lung bases. More confluent
right lower lobe basal segment pulmonary opacity also appears to be
enhancing compatible with atelectasis. No cardiomegaly. No
pericardial or pleural effusion.

Hepatobiliary:

There is subtle gallbladder wall thickening or edema on series 2,
image 26 and coronal image 20 with a possible gallstone in the neck
on series 2, image 18.

There is perhaps mild hyperenhancement of the liver along the
gallbladder fossa. There are several tiny low-density areas in the
liver which are likely benign cysts (the largest on series 2, image
12).

Pancreas: Negative.

Spleen: Negative.

Adrenals/Urinary Tract: Normal adrenal glands.

Several small benign appearing left renal cysts. Bilateral renal
enhancement and contrast excretion is symmetric and normal. Normal
proximal ureters. Unremarkable urinary bladder.

Stomach/Bowel: Decompressed and negative descending, rectosigmoid
colon. Redundant and mildly gas-filled transverse colon.
Decompressed ascending colon. Mildly gas-filled cecum. Normal
appendix tracking along the right pelvic side wall on series 2,
image 66. No large bowel inflammation. Negative terminal ileum.

Occasionally fluid-filled but nondilated small bowel loops. No small
bowel mesenteric stranding. The stomach and duodenum appear within
normal limits.

No free air, free fluid.

Vascular/Lymphatic: Aortoiliac calcified atherosclerosis. Major
arterial structures are patent. Portal venous system is patent.

No lymphadenopathy.

Reproductive: Negative.

Other: No pelvic free fluid.

Musculoskeletal: T10 benign vertebral body hemangioma partially
visible. No acute osseous abnormality identified.
IMPRESSION: 1. Subtle gallbladder wall thickening or edema suspicious for Acute
Cholecystitis with possible gallstone in the neck. Right upper
quadrant ultrasound may be confirmatory.
2. No other acute or inflammatory process identified in the abdomen
or pelvis. Normal appendix. Right lower lobe atelectasis.
3. Aortic Atherosclerosis ([id number]).

## 2019-10-12 ENCOUNTER — Other Ambulatory Visit: Payer: Self-pay | Admitting: Physician Assistant

## 2019-11-14 ENCOUNTER — Encounter: Payer: Self-pay | Admitting: Cardiovascular Disease

## 2019-11-14 ENCOUNTER — Other Ambulatory Visit: Payer: Self-pay

## 2019-11-14 ENCOUNTER — Ambulatory Visit (INDEPENDENT_AMBULATORY_CARE_PROVIDER_SITE_OTHER): Payer: Medicare HMO | Admitting: Cardiovascular Disease

## 2019-11-14 VITALS — BP 110/78 | HR 84 | Ht 70.0 in | Wt 151.5 lb

## 2019-11-14 DIAGNOSIS — Z720 Tobacco use: Secondary | ICD-10-CM

## 2019-11-14 DIAGNOSIS — E785 Hyperlipidemia, unspecified: Secondary | ICD-10-CM | POA: Diagnosis not present

## 2019-11-14 DIAGNOSIS — I2510 Atherosclerotic heart disease of native coronary artery without angina pectoris: Secondary | ICD-10-CM

## 2019-11-14 MED ORDER — ATORVASTATIN CALCIUM 80 MG PO TABS: 80.0000 mg | ORAL_TABLET | Freq: Every day | ORAL | 3 refills | Status: DC

## 2019-11-14 MED ORDER — EZETIMIBE 10 MG PO TABS: 10.0000 mg | ORAL_TABLET | Freq: Every day | ORAL | 3 refills | Status: DC

## 2019-11-14 NOTE — Progress Notes (Signed)
Cardiology Office Note   Date:  11/14/2019   ID:  Thomas Romero, DOB 02/08/1966, MRN [id number]  PCP:  [facility]  Cardiologist:   Lorine Bears, MD   Chief Complaint  Patient presents with  . office visit'    Pt has no complaints. Meds verbally reviewed w/ pt.      History of Present Illness: Thomas Romero is a 54 y.o. male who presents for a follow-up visit regarding coronary artery disease.  He has prolonged history of tobacco use, previous gastric ulcer, anxiety and depression and hyperlipidemia. He had inferior ST elevation myocardial infarction in November 2019. Cardiac catheterization showed 99% stenosis in the mid RCA which was the culprit which was treated successfully with PCI and drug-eluting stent placement.  There was moderate mid left circumflex disease that was left to be treated medically.  Unfortunately, he continues to smoke 1-1/2 pack/day and he reports inability to quit.  He started smoking when he was 53 years old.  He has been doing well with no recent chest pain, shortness of breath or palpitations.    Past Medical History:  Diagnosis Date  . Coronary artery disease    Non-ST elevation myocardial infarction in November 2019.  Cardiac catheterization showed 99% stenosis in the mid RCA treated successfully with PCI and drug-eluting stent placement in moderate mid left circumflex disease.  . Gastric ulcer   . Hyperlipidemia   . Manic depression (HCC)   . Panic attacks   . Tobacco use     Past Surgical History:  Procedure Laterality Date  . CORONARY/GRAFT ACUTE MI REVASCULARIZATION N/A 07/25/2018   Procedure: Coronary/Graft Acute MI Revascularization;  Surgeon: Iran Ouch, MD;  Location: [facility] INVASIVE CV LAB;  Service: Cardiovascular;  Laterality: N/A;  . LEFT HEART CATH AND CORONARY ANGIOGRAPHY N/A 07/25/2018   Procedure: LEFT HEART CATH AND CORONARY ANGIOGRAPHY;  Surgeon: Iran Ouch, MD;  Location: [facility]  INVASIVE CV LAB;  Service: Cardiovascular;  Laterality: N/A;  . SMALL INTESTINE SURGERY       Current Outpatient Medications  Medication Sig Dispense Refill  . aspirin 81 MG chewable tablet Chew 1 tablet (81 mg total) by mouth daily. 90 tablet 0  . atorvastatin (LIPITOR) 80 MG tablet Take 1 tablet (80 mg total) by mouth daily at 6 PM. Please call to schedule appointment for further refills. Thank you! 30 tablet 0  . ezetimibe (ZETIA) 10 MG tablet Take 1 tablet by mouth once daily 30 tablet 0  . nitroGLYCERIN (NITROSTAT) 0.4 MG SL tablet Place 1 tablet (0.4 mg total) under the tongue every 5 (five) minutes as needed for chest pain. 25 tablet 1  . pantoprazole (PROTONIX) 40 MG tablet Take 1 tablet by mouth once daily 90 tablet 0   No current facility-administered medications for this visit.    Allergies:   Ibuprofen    Social History:  The patient  reports that he has been smoking cigarettes. He has a 72.00 pack-year smoking history. He has never used smokeless tobacco. He reports current alcohol use. He reports that he does not use drugs.   Family History:  The patient's family history includes Heart attack in his brother, mother, and sister; Stroke in his father.    ROS:  Please see the history of present illness.   Otherwise, review of systems are positive for none.   All other systems are reviewed and negative.    PHYSICAL EXAM: VS:  BP 110/78 (BP Location:  Left Arm, Patient Position: Sitting, Cuff Size: Normal)   Pulse 84   Ht 5\' 10"  (1.778 m)   Wt 151 lb 8 oz (68.7 kg)   SpO2 97%   BMI 21.74 kg/m  , BMI Body mass index is 21.74 kg/m. GEN: Well nourished, well developed, in no acute distress  HEENT: normal  Neck: no JVD, carotid bruits, or masses Cardiac: RRR; no murmurs, rubs, or gallops,no edema  Respiratory:  clear to auscultation bilaterally, normal work of breathing GI: soft, nontender, nondistended, + BS MS: no deformity or atrophy  Skin: warm and dry, no  rash Neuro:  Strength and sensation are intact Psych: euthymic mood, full affect Right ulnar pulses normal with no hematoma   EKG:  EKG is ordered today. The ekg ordered today demonstrates normal sinus rhythm with old inferior infarct.   Recent Labs: 12/12/2018: ALT 35    Lipid Panel    Component Value Date/Time   CHOL 129 12/12/2018 0858   CHOL 149 09/28/2018 1502   TRIG 107 12/12/2018 0858   HDL 25 (L) 12/12/2018 0858   HDL 25 (L) 09/28/2018 1502   CHOLHDL 5.2 12/12/2018 0858   VLDL 21 12/12/2018 0858   LDLCALC 83 12/12/2018 0858   LDLCALC 99 09/28/2018 1502   LDLDIRECT 105 (H) 09/28/2018 1502      Wt Readings from Last 3 Encounters:  11/14/19 151 lb 8 oz (68.7 kg)  04/04/19 145 lb (65.8 kg)  11/15/18 139 lb (63 kg)      No flowsheet data found.    ASSESSMENT AND PLAN:  1.  Coronary artery disease involving native coronary arteries without angina: He is doing well overall with no anginal symptoms.  Continue medical therapy.  He finished treatment with Brilinta.  Continue aspirin indefinitely.  2.  Hyperlipidemia: Continue high-dose atorvastatin and Zetia.  I requested routine labs including lipid and liver profile.  3.  Tobacco use: I discussed with him the importance of smoking cessation.  He reports inability to quit.   Disposition:   FU with me in 6 months  Signed,  Kathlyn Sacramento, MD  11/14/2019 2:20 PM    [place]

## 2019-11-14 NOTE — Patient Instructions (Signed)
Medication Instructions:  Your physician has recommended you make the following change in your medication:   STOP Protonix  Continue your other medications as prescribed.  *If you need a refill on your cardiac medications before your next appointment, please call your pharmacy*   Lab Work: Cmet, Cbc, Lipid today. If you have labs (blood work) drawn today and your tests are completely normal, you will receive your results only by: Marland Kitchen MyChart Message (if you have MyChart) OR . A paper copy in the mail If you have any lab test that is abnormal or we need to change your treatment, we will call you to review the results.   Testing/Procedures: None ordered   Follow-Up: At [facility], you and your health needs are our priority.  As part of our continuing mission to provide you with exceptional heart care, we have created designated Provider Care Teams.  These Care Teams include your primary Cardiologist (physician) and Advanced Practice Providers (APPs -  Physician Assistants and Nurse Practitioners) who all work together to provide you with the care you need, when you need it.  We recommend signing up for the patient portal called "MyChart".  Sign up information is provided on this After Visit Summary.  MyChart is used to connect with patients for Virtual Visits (Telemedicine).  Patients are able to view lab/test results, encounter notes, upcoming appointments, etc.  Non-urgent messages can be sent to your provider as well.   To learn more about what you can do with MyChart, go to [email or web address].    Your next appointment:   6 month(s)  The format for your next appointment:   In Person  Provider:    You may see Lorine Bears, MD or one of the following Advanced Practice Providers on your designated Care Team:    Nicolasa Ducking, NP  Eula Listen, PA-C  Marisue Ivan, PA-C    Other Instructions N/A

## 2019-11-15 LAB — COMPREHENSIVE METABOLIC PANEL
ALT: 24 IU/L (ref 0–44)
AST: 23 IU/L (ref 0–40)
Albumin/Globulin Ratio: 2 (ref 1.2–2.2)
Albumin: 4.9 g/dL (ref 3.8–4.9)
Alkaline Phosphatase: 108 IU/L (ref 39–117)
BUN/Creatinine Ratio: 9 (ref 9–20)
BUN: 9 mg/dL (ref 6–24)
Bilirubin Total: 0.3 mg/dL (ref 0.0–1.2)
CO2: 19 mmol/L — ABNORMAL LOW (ref 20–29)
Calcium: 9.7 mg/dL (ref 8.7–10.2)
Chloride: 103 mmol/L (ref 96–106)
Creatinine, Ser: 1.05 mg/dL (ref 0.76–1.27)
GFR calc Af Amer: 94 mL/min/{1.73_m2} (ref >59)
GFR calc non Af Amer: 81 mL/min/{1.73_m2} (ref >59)
Globulin, Total: 2.4 g/dL (ref 1.5–4.5)
Glucose: 88 mg/dL (ref 65–99)
Potassium: 4.3 mmol/L (ref 3.5–5.2)
Sodium: 138 mmol/L (ref 134–144)
Total Protein: 7.3 g/dL (ref 6.0–8.5)

## 2019-11-15 LAB — CBC WITH DIFFERENTIAL/PLATELET
Basophils Absolute: 0.1 10*3/uL (ref 0.0–0.2)
Basos: 1 %
EOS (ABSOLUTE): 0.2 10*3/uL (ref 0.0–0.4)
Eos: 3 %
Hematocrit: 49 % (ref 37.5–51.0)
Hemoglobin: 16.9 g/dL (ref 13.0–17.7)
Immature Grans (Abs): 0 10*3/uL (ref 0.0–0.1)
Immature Granulocytes: 0 %
Lymphocytes Absolute: 1.9 10*3/uL (ref 0.7–3.1)
Lymphs: 30 %
MCH: 31.5 pg (ref 26.6–33.0)
MCHC: 34.5 g/dL (ref 31.5–35.7)
MCV: 91 fL (ref 79–97)
Monocytes Absolute: 0.5 10*3/uL (ref 0.1–0.9)
Monocytes: 7 %
Neutrophils Absolute: 3.6 10*3/uL (ref 1.4–7.0)
Neutrophils: 59 %
Platelets: 192 10*3/uL (ref 150–450)
RBC: 5.37 x10E6/uL (ref 4.14–5.80)
RDW: 13.1 % (ref 11.6–15.4)
WBC: 6.2 10*3/uL (ref 3.4–10.8)

## 2019-11-15 LAB — LIPID PANEL
Chol/HDL Ratio: 9.4 ratio — ABNORMAL HIGH (ref 0.0–5.0)
Cholesterol, Total: 225 mg/dL — ABNORMAL HIGH (ref 100–199)
HDL: 24 mg/dL — ABNORMAL LOW (ref >39)
LDL Chol Calc (NIH): 148 mg/dL — ABNORMAL HIGH (ref 0–99)
Triglycerides: 285 mg/dL — ABNORMAL HIGH (ref 0–149)
VLDL Cholesterol Cal: 53 mg/dL — ABNORMAL HIGH (ref 5–40)

## 2019-11-22 ENCOUNTER — Telehealth: Payer: Self-pay

## 2019-11-22 DIAGNOSIS — I2510 Atherosclerotic heart disease of native coronary artery without angina pectoris: Secondary | ICD-10-CM

## 2019-11-22 NOTE — Telephone Encounter (Signed)
-----   Message from Iran Ouch, MD sent at 11/22/2019  2:29 PM EDT ----- Inform patient that labs were normal. Cholesterol is very high.  His cholesterol was much better last year.  He might not be taking atorvastatin and Zetia every day.  Can you please check with him.

## 2019-11-22 NOTE — Telephone Encounter (Signed)
Spoke with patient regarding results and recommendations.  Patient verbalizes understanding and is agreeable to plan of care. He does state that he has been taking his atorvastatin and Zetia every day as prescribed.  Advised patient to call back with any issues or concerns.   Routing to Dr Kirke Corin for further recommendations.

## 2019-11-24 NOTE — Telephone Encounter (Signed)
Recommend adding Repatha 140 mg subcu every 2 weeks.

## 2019-11-24 NOTE — Telephone Encounter (Signed)
Spoke with the patient and made him aware of Dr. Jari Sportsman response and recommendation. Patient sts that he will not take any injectable medications. Patient sts that he was out of his cholesterol medication for a little while and that might be why his numbers were high.  Advised the patient that I will fwd the update to Dr. Kirke Corin.

## 2019-11-24 NOTE — Telephone Encounter (Signed)
Returned call to patient with recommendations from Dr. Kirke Corin.   Pt verbalized understanding and is agreeable to POC.   Lab order placed for fasting labs in 3 months.   No med changes.   Advised pt to call for any further questions or concerns.

## 2019-11-24 NOTE — Addendum Note (Signed)
Addended by: Efrain Sella on: 11/24/2019 12:12 PM   Modules accepted: Orders

## 2019-11-24 NOTE — Telephone Encounter (Signed)
Okay we can hold off on Repatha.  He should take Zetia and atorvastatin daily without interruption and repeat lipid and liver profile in 3 months.

## 2020-02-23 ENCOUNTER — Other Ambulatory Visit: Payer: Self-pay

## 2020-02-23 ENCOUNTER — Other Ambulatory Visit
Admission: RE | Admit: 2020-02-23 | Discharge: 2020-02-23 | Disposition: A | Discharge status: Home / Self Care | Payer: Medicare HMO | Attending: Cardiovascular Disease | Admitting: Cardiovascular Disease

## 2020-02-23 DIAGNOSIS — I2510 Atherosclerotic heart disease of native coronary artery without angina pectoris: Secondary | ICD-10-CM

## 2020-02-23 LAB — HEPATIC FUNCTION PANEL
ALT: 36 U/L (ref 0–44)
AST: 25 U/L (ref 15–41)
Albumin: 4.3 g/dL (ref 3.5–5.0)
Alkaline Phosphatase: 84 U/L (ref 38–126)
Bilirubin, Direct: <0.1 mg/dL (ref 0.0–0.2)
Total Bilirubin: 0.7 mg/dL (ref 0.3–1.2)
Total Protein: 6.8 g/dL (ref 6.5–8.1)

## 2020-02-23 LAB — LIPID PANEL
Cholesterol: 100 mg/dL (ref 0–200)
HDL: 18 mg/dL — ABNORMAL LOW (ref >40)
LDL Cholesterol: 58 mg/dL (ref 0–99)
Total CHOL/HDL Ratio: 5.6 RATIO
Triglycerides: 120 mg/dL (ref <150)
VLDL: 24 mg/dL (ref 0–40)

## 2020-05-16 ENCOUNTER — Encounter: Payer: Self-pay | Admitting: Cardiovascular Disease

## 2020-05-16 ENCOUNTER — Other Ambulatory Visit: Payer: Self-pay

## 2020-05-16 ENCOUNTER — Ambulatory Visit (INDEPENDENT_AMBULATORY_CARE_PROVIDER_SITE_OTHER): Payer: Medicare HMO | Admitting: Cardiovascular Disease

## 2020-05-16 VITALS — BP 108/60 | HR 56 | Ht 70.0 in | Wt 151.6 lb

## 2020-05-16 DIAGNOSIS — E785 Hyperlipidemia, unspecified: Secondary | ICD-10-CM | POA: Diagnosis not present

## 2020-05-16 DIAGNOSIS — Z720 Tobacco use: Secondary | ICD-10-CM

## 2020-05-16 DIAGNOSIS — I2510 Atherosclerotic heart disease of native coronary artery without angina pectoris: Secondary | ICD-10-CM | POA: Diagnosis not present

## 2020-05-16 MED ORDER — NITROGLYCERIN 0.4 MG SL SUBL: 0.4000 mg | SUBLINGUAL_TABLET | SUBLINGUAL | 1 refills | Status: DC | PRN

## 2020-05-16 MED ORDER — EZETIMIBE 10 MG PO TABS
10.0000 mg | ORAL_TABLET | Freq: Every day | ORAL | 3 refills | Status: DC
Start: 2020-05-16 — End: 2021-07-07

## 2020-05-16 MED ORDER — ATORVASTATIN CALCIUM 80 MG PO TABS
80.0000 mg | ORAL_TABLET | Freq: Every day | ORAL | 3 refills | Status: DC
Start: 2020-05-16 — End: 2021-07-07

## 2020-05-16 NOTE — Progress Notes (Signed)
Cardiology Office Note   Date:  05/16/2020   ID:  YANDRIEL BOENING, DOB 1966-03-26, MRN [id number]  PCP:  [facility]  Cardiologist:   Lorine Bears, MD   Chief Complaint  Patient presents with  . 6 month f/u (doing well)      History of Present Illness: Thomas Romero is a 54 y.o. male who presents for a follow-up visit regarding coronary artery disease.  He has prolonged history of tobacco use, previous heavy alcohol use, previous gastric ulcer, anxiety and depression and hyperlipidemia. He had inferior ST elevation myocardial infarction in November 2019. Cardiac catheterization showed 99% stenosis in the mid RCA which was the culprit which was treated successfully with PCI and drug-eluting stent placement.  There was moderate mid left circumflex disease that was left to be treated medically.  Unfortunately, he continues to smoke 1-1/2 pack/day and he reports inability to quit.  He started smoking when he was 54 years old.  He has been doing well with no recent chest pain, shortness of breath or palpitations.    Past Medical History:  Diagnosis Date  . Coronary artery disease    Non-ST elevation myocardial infarction in November 2019.  Cardiac catheterization showed 99% stenosis in the mid RCA treated successfully with PCI and drug-eluting stent placement in moderate mid left circumflex disease.  . Gastric ulcer   . Hyperlipidemia   . Manic depression (HCC)   . Panic attacks   . Tobacco use     Past Surgical History:  Procedure Laterality Date  . CORONARY/GRAFT ACUTE MI REVASCULARIZATION N/A 07/25/2018   Procedure: Coronary/Graft Acute MI Revascularization;  Surgeon: Iran Ouch, MD;  Location: [facility] INVASIVE CV LAB;  Service: Cardiovascular;  Laterality: N/A;  . LEFT HEART CATH AND CORONARY ANGIOGRAPHY N/A 07/25/2018   Procedure: LEFT HEART CATH AND CORONARY ANGIOGRAPHY;  Surgeon: Iran Ouch, MD;  Location: [facility] INVASIVE CV LAB;   Service: Cardiovascular;  Laterality: N/A;  . SMALL INTESTINE SURGERY       Current Outpatient Medications  Medication Sig Dispense Refill  . aspirin 81 MG chewable tablet Chew 1 tablet (81 mg total) by mouth daily. 90 tablet 0  . atorvastatin (LIPITOR) 80 MG tablet Take 1 tablet (80 mg total) by mouth daily at 6 PM. 90 tablet 3  . ezetimibe (ZETIA) 10 MG tablet Take 1 tablet (10 mg total) by mouth daily. 90 tablet 3  . metoprolol succinate (TOPROL-XL) 50 MG 24 hr tablet Take 50 mg by mouth daily. Take with or immediately following a meal.    . nitroGLYCERIN (NITROSTAT) 0.4 MG SL tablet Place 1 tablet (0.4 mg total) under the tongue every 5 (five) minutes as needed for chest pain. 25 tablet 1  . traZODone (DESYREL) 50 MG tablet Take 50 mg by mouth at bedtime.     No current facility-administered medications for this visit.    Allergies:   Ibuprofen    Social History:  The patient  reports that he has been smoking cigarettes. He has a 72.00 pack-year smoking history. He has never used smokeless tobacco. He reports current alcohol use. He reports that he does not use drugs.   Family History:  The patient's family history includes Heart attack in his brother, mother, and sister; Stroke in his father.    ROS:  Please see the history of present illness.   Otherwise, review of systems are positive for none.   All other systems are reviewed and negative.  PHYSICAL EXAM: VS:  BP 108/60   Pulse (!) 56   Ht 5\' 10"  (1.778 m)   Wt 151 lb 9.6 oz (68.8 kg)   BMI 21.75 kg/m  , BMI Body mass index is 21.75 kg/m. GEN: Well nourished, well developed, in no acute distress  HEENT: normal  Neck: no JVD, carotid bruits, or masses Cardiac: RRR; no murmurs, rubs, or gallops,no edema  Respiratory:  clear to auscultation bilaterally, normal work of breathing GI: soft, nontender, nondistended, + BS MS: no deformity or atrophy  Skin: warm and dry, no rash Neuro:  Strength and sensation are  intact Psych: euthymic mood, full affect Right ulnar pulses normal with no hematoma   EKG:  EKG is ordered today. The ekg ordered today demonstrates sinus bradycardia with no significant ST or T wave changes.   Recent Labs: 11/14/2019: BUN 9; Creatinine, Ser 1.05; Hemoglobin 16.9; Platelets 192; Potassium 4.3; Sodium 138 02/23/2020: ALT 36    Lipid Panel    Component Value Date/Time   CHOL 100 02/23/2020 1345   CHOL 225 (H) 11/14/2019 1434   TRIG 120 02/23/2020 1345   HDL 18 (L) 02/23/2020 1345   HDL 24 (L) 11/14/2019 1434   CHOLHDL 5.6 02/23/2020 1345   VLDL 24 02/23/2020 1345   LDLCALC 58 02/23/2020 1345   LDLCALC 148 (H) 11/14/2019 1434   LDLDIRECT 105 (H) 09/28/2018 1502      Wt Readings from Last 3 Encounters:  05/16/20 151 lb 9.6 oz (68.8 kg)  11/14/19 151 lb 8 oz (68.7 kg)  04/04/19 145 lb (65.8 kg)      No flowsheet data found.    ASSESSMENT AND PLAN:  1.  Coronary artery disease involving native coronary arteries without angina: He is doing well overall with no anginal symptoms.  Continue medical therapy.   Continue aspirin indefinitely.  2.  Hyperlipidemia: Continue high-dose atorvastatin and Zetia.  Most recent lipid profile showed an LDL of 58 and triglyceride of 120.  3.  Tobacco use: I discussed with him the importance of smoking cessation.  He reports inability to quit.   Disposition:   FU with me in 6 months  Signed,  04/06/19, MD  05/16/2020 1:55 PM    [facility]

## 2020-05-16 NOTE — Patient Instructions (Signed)

## 2020-07-07 IMAGING — US US ABDOMEN LIMITED
1 series · 14 of 25 positions shown · non-contrast
Comparison: CT Abdomen and Pelvis earlier today.

CLINICAL DATA: 52-year-old male with abdominal pain and vomiting
since yesterday. Suspicion of acute cholecystitis on earlier CT.

EXAM:
ULTRASOUND ABDOMEN LIMITED RIGHT UPPER QUADRANT

[Series 1: us abdomen limited · 0.26mm/px · 14 of 50 slices shown]
[im 1/50]
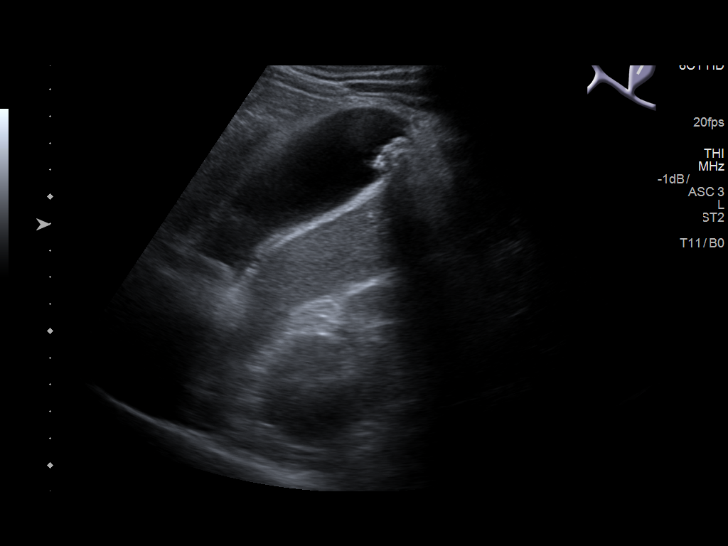
[im 5/50]
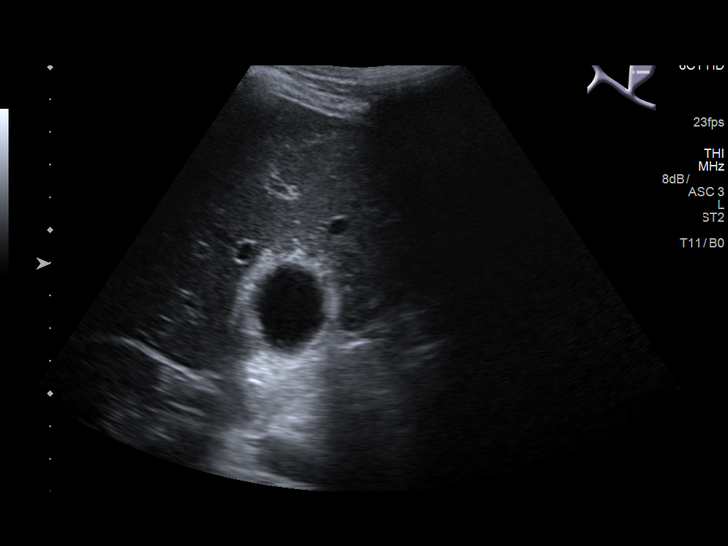
[im 9/50]
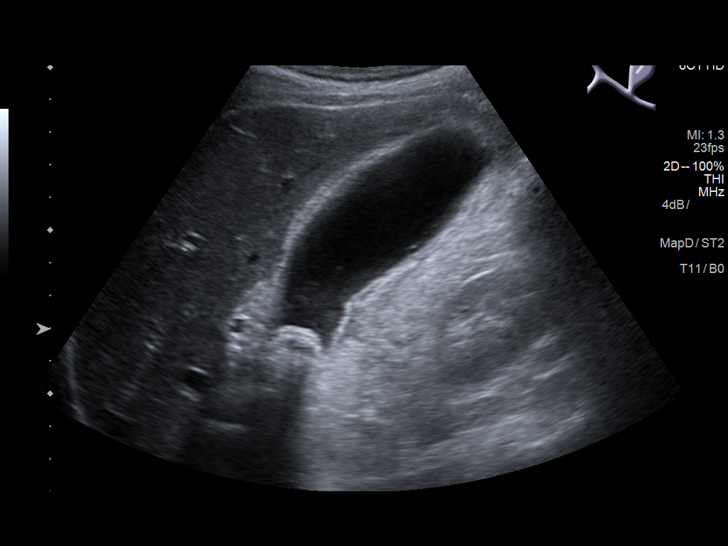
[im 13/50]
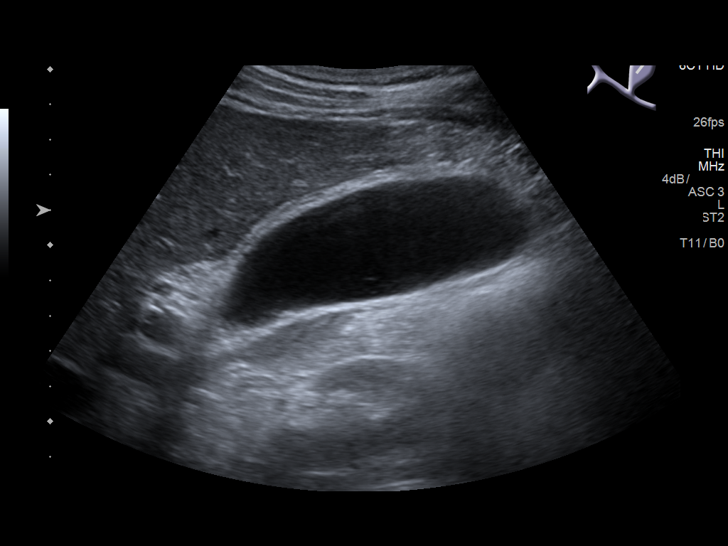
[im 17/50]
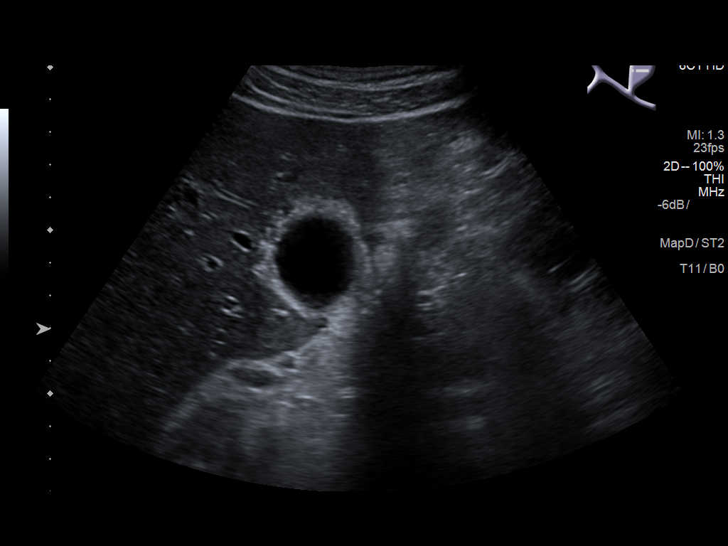
[im 19/50]
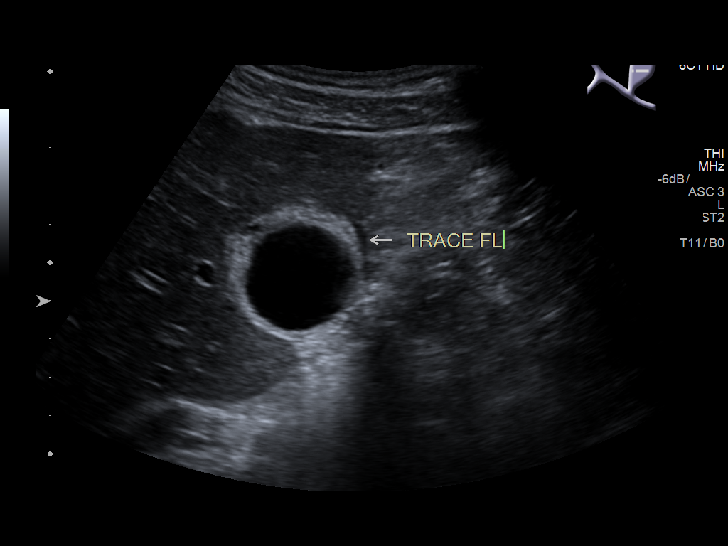
[im 23/50]
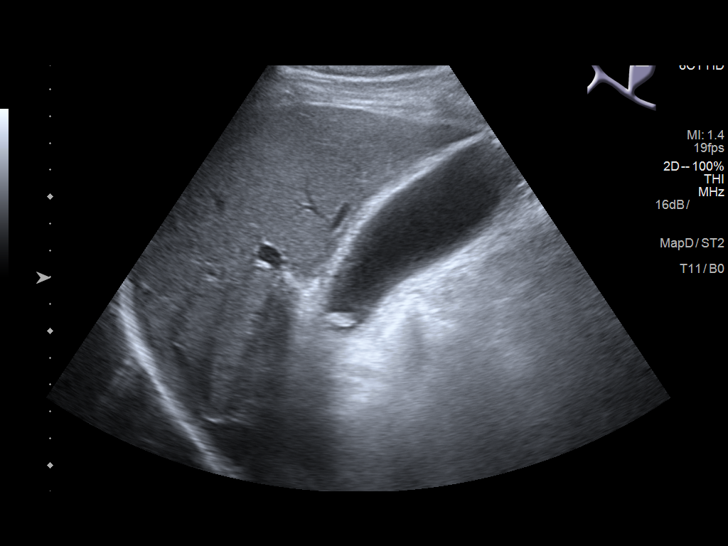
[im 27/50]
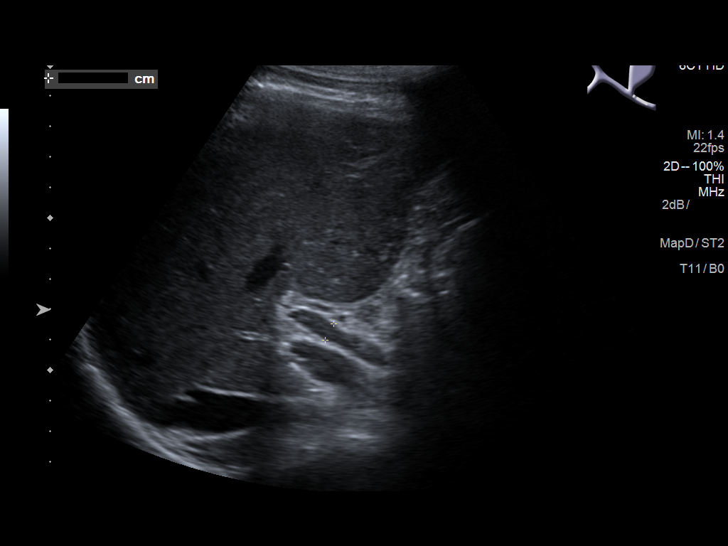
[im 31/50]
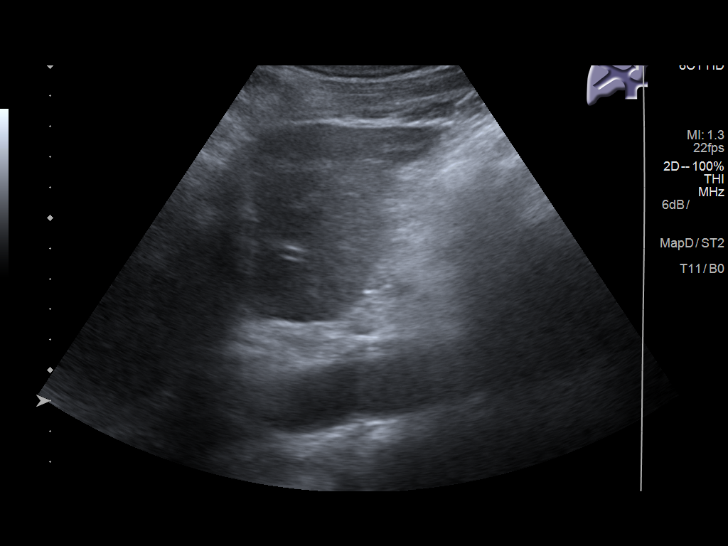
[im 33/50]
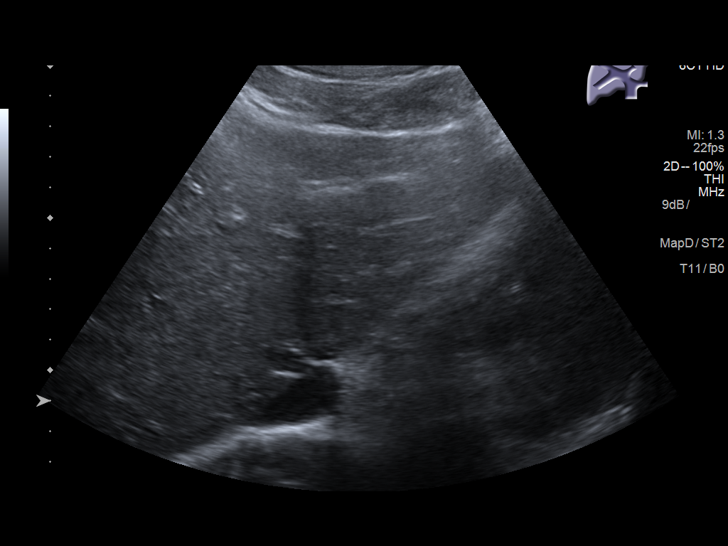
[im 37/50]
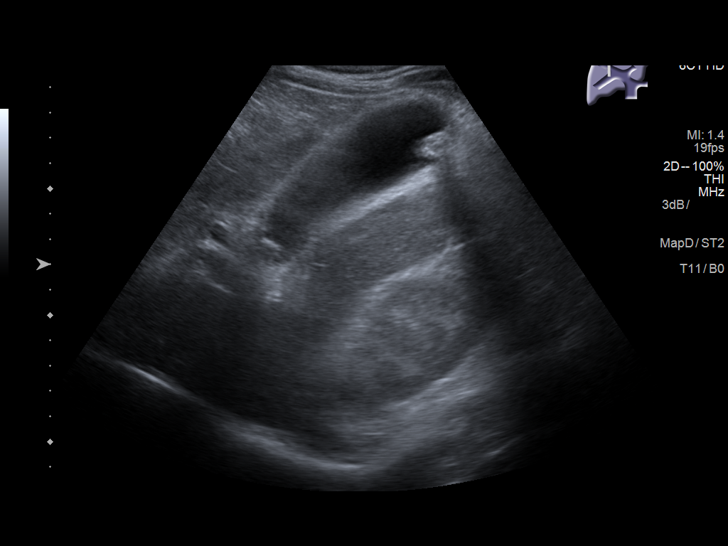
[im 41/50]
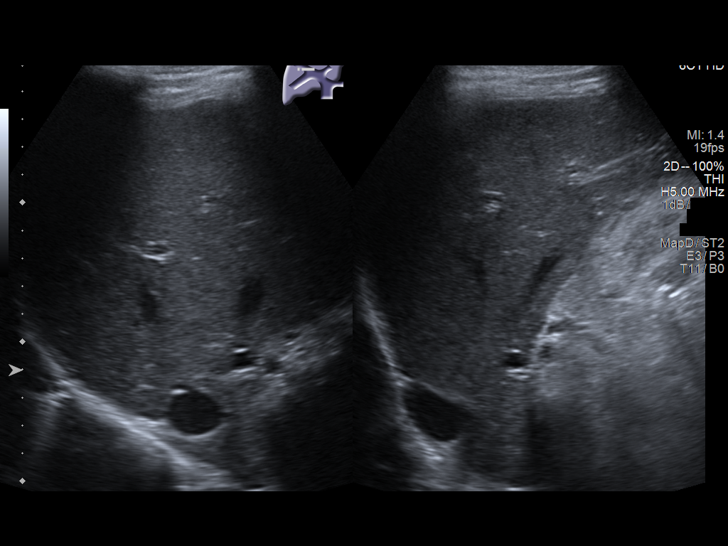
[im 45/50]
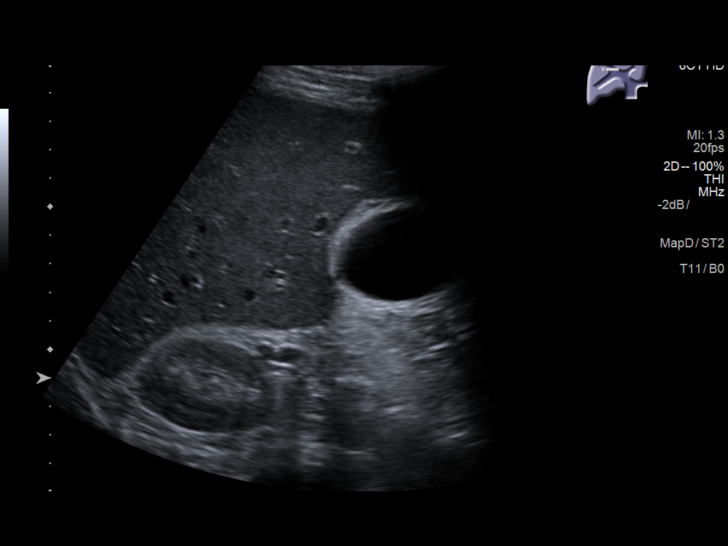
[im 50/50]
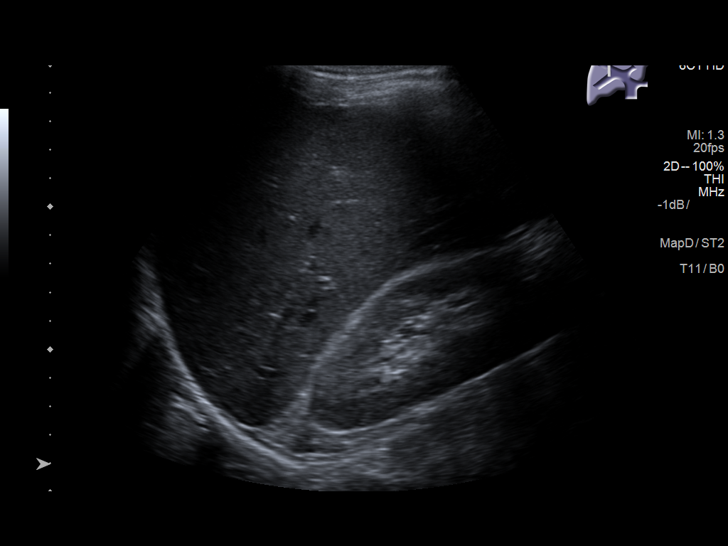

[14 of 25 positions shown; findings below may reference images not displayed]

FINDINGS: Gallbladder:

2.1 centimeter gallstone lodged in the neck of the gallbladder with
gallbladder wall thickening up to 5 millimeters. Trace
pericholecystic fluid. Superimposed sludge.

Common bile duct:

Diameter: 6 millimeters, upper limits of normal.

Liver:

Normal background liver echogenicity. Small central a patent cyst
redemonstrated measuring 9 millimeters on image 45. No intrahepatic
biliary ductal dilatation. Portal vein is patent on color Doppler
imaging with normal direction of blood flow towards the liver.

Other findings: Negative visible right kidney.
IMPRESSION: 1. Acute Cholecystitis with a 2.1 cm gallstone lodged in the neck of
the gallbladder. Trace pericholecystic fluid.
2. CBD at the upper limits of normal.

## 2021-07-07 ENCOUNTER — Other Ambulatory Visit: Payer: Self-pay | Admitting: Cardiovascular Disease

## 2021-09-09 ENCOUNTER — Other Ambulatory Visit: Payer: Self-pay | Admitting: Cardiovascular Disease

## 2021-09-09 NOTE — Telephone Encounter (Signed)
No Answer unable to leave a message

## 2021-09-09 NOTE — Telephone Encounter (Signed)
Please contact pt for future appointment. Pt overdue for 6 month f/u. Pt needing refills. 

## 2021-09-25 NOTE — Telephone Encounter (Signed)
Attempted to schedule.  

## 2021-10-17 ENCOUNTER — Encounter: Payer: Self-pay | Admitting: Cardiovascular Disease

## 2021-10-17 ENCOUNTER — Ambulatory Visit (INDEPENDENT_AMBULATORY_CARE_PROVIDER_SITE_OTHER): Payer: Medicare Other | Admitting: Cardiovascular Disease

## 2021-10-17 ENCOUNTER — Other Ambulatory Visit: Payer: Self-pay

## 2021-10-17 VITALS — BP 130/80 | HR 63 | Ht 70.0 in | Wt 160.0 lb

## 2021-10-17 DIAGNOSIS — E785 Hyperlipidemia, unspecified: Secondary | ICD-10-CM

## 2021-10-17 DIAGNOSIS — I2510 Atherosclerotic heart disease of native coronary artery without angina pectoris: Secondary | ICD-10-CM | POA: Diagnosis not present

## 2021-10-17 DIAGNOSIS — Z720 Tobacco use: Secondary | ICD-10-CM

## 2021-10-17 MED ORDER — EZETIMIBE 10 MG PO TABS: 10.0000 mg | ORAL_TABLET | Freq: Every day | ORAL | 3 refills | Status: DC

## 2021-10-17 MED ORDER — METOPROLOL SUCCINATE ER 50 MG PO TB24: 50.0000 mg | ORAL_TABLET | Freq: Every day | ORAL | 3 refills | Status: DC

## 2021-10-17 MED ORDER — ATORVASTATIN CALCIUM 80 MG PO TABS: 80.0000 mg | ORAL_TABLET | Freq: Every day | ORAL | 3 refills | Status: DC

## 2021-10-17 NOTE — Patient Instructions (Signed)
Medication Instructions:  Your physician recommends that you continue on your current medications as directed. Please refer to the Current Medication list given to you today.  You cardiac medications have been refilled today  *If you need a refill on your cardiac medications before your next appointment, please call your pharmacy*   Lab Work: Lipid and Cmp today  If you have labs (blood work) drawn today and your tests are completely normal, you will receive your results only by: MyChart Message (if you have MyChart) OR A paper copy in the mail If you have any lab test that is abnormal or we need to change your treatment, we will call you to review the results.   Testing/Procedures: None ordered   Follow-Up: At [facility], you and your health needs are our priority.  As part of our continuing mission to provide you with exceptional heart care, we have created designated Provider Care Teams.  These Care Teams include your primary Cardiologist (physician) and Advanced Practice Providers (APPs -  Physician Assistants and Nurse Practitioners) who all work together to provide you with the care you need, when you need it.  We recommend signing up for the patient portal called "MyChart".  Sign up information is provided on this After Visit Summary.  MyChart is used to connect with patients for Virtual Visits (Telemedicine).  Patients are able to view lab/test results, encounter notes, upcoming appointments, etc.  Non-urgent messages can be sent to your provider as well.   To learn more about what you can do with MyChart, go to [email or web address].    Your next appointment:   Your physician wants you to follow-up in: 1 year You will receive a reminder letter in the mail two months in advance. If you don't receive a letter, please call our office to schedule the follow-up appointment.   The format for your next appointment:   In Person  Provider:   You may see Thomas Bears, MD or  one of the following Advanced Practice Providers on your designated Care Team:   Nicolasa Ducking, NP Eula Listen, PA-C Cadence Fransico Michael, [place]   Other Instructions N/A

## 2021-10-17 NOTE — Progress Notes (Signed)
Cardiology Office Note   Date:  10/17/2021   ID:  Thomas Romero, Thomas Romero 10/28/1965, MRN [id number]  [facility]P:  [facility]  Cardiologist:   Lorine Bears, MD   Chief Complaint  Patient presents with   Follow-up    6 Months follow up and medications verbally reviewed with patient.       History of Present Illness: Thomas Romero is a 56 y.o. male who presents for a follow-up visit regarding coronary artery disease.  He has prolonged history of tobacco use, previous heavy alcohol use, previous gastric ulcer, anxiety and depression and hyperlipidemia. He had inferior ST elevation myocardial infarction in November 2019. Cardiac catheterization showed 99% stenosis in the mid RCA which was the culprit which was treated successfully with PCI and drug-eluting stent placement.  There was moderate mid left circumflex disease that was left to be treated medically.  He has not been seen in our office in more than 1 year but overall has been doing well with no chest pain.  He has chronic exertional dyspnea likely due to extensive tobacco use.  He continues to smoke 2 packs/day.  He started smoking when he was 56 years old.    Past Medical History:  Diagnosis Date   Coronary artery disease    Non-ST elevation myocardial infarction in November 2019.  Cardiac catheterization showed 99% stenosis in the mid RCA treated successfully with PCI and drug-eluting stent placement in moderate mid left circumflex disease.   Gastric ulcer    Hyperlipidemia    Manic depression (HCC)    Panic attacks    Tobacco use     Past Surgical History:  Procedure Laterality Date   CORONARY/GRAFT ACUTE MI REVASCULARIZATION N/A 07/25/2018   Procedure: Coronary/Graft Acute MI Revascularization;  Surgeon: Iran Ouch, MD;  Location: [facility] INVASIVE CV LAB;  Service: Cardiovascular;  Laterality: N/A;   LEFT HEART CATH AND CORONARY ANGIOGRAPHY N/A 07/25/2018   Procedure: LEFT HEART CATH AND  CORONARY ANGIOGRAPHY;  Surgeon: Iran Ouch, MD;  Location: [facility] INVASIVE CV LAB;  Service: Cardiovascular;  Laterality: N/A;   SMALL INTESTINE SURGERY       Current Outpatient Medications  Medication Sig Dispense Refill   albuterol (VENTOLIN HFA) 108 (90 Base) MCG/ACT inhaler Inhale 1-2 puffs into the lungs as needed.     aspirin 81 MG chewable tablet Chew 1 tablet (81 mg total) by mouth daily. 90 tablet 0   atorvastatin (LIPITOR) 80 MG tablet TAKE 1 TABLET BY MOUTH ONCE DAILY AT 6PM. CONTACT OFFICE TO SCHEDULE APPOINTMENT FOR FURTHER REFILLS. 35 tablet 0   ezetimibe (ZETIA) 10 MG tablet TAKE 1 TABLET BY MOUTH ONCE DAILY. NO FURTHER REFILLS UNTIL SEEN IN CLINIC. CALL OFFICE TO SCHEDULE APPOINTMENT. 35 tablet 0   metoprolol succinate (TOPROL-XL) 50 MG 24 hr tablet Take 50 mg by mouth daily. Take with or immediately following a meal.     nitroGLYCERIN (NITROSTAT) 0.4 MG SL tablet Place 1 tablet (0.4 mg total) under the tongue every 5 (five) minutes as needed for chest pain. 25 tablet 1   traZODone (DESYREL) 50 MG tablet Take 50 mg by mouth at bedtime.     No current facility-administered medications for this visit.    Allergies:   Ibuprofen    Social History:  The patient  reports that he has been smoking cigarettes. He has a 72.00 pack-year smoking history. He has never used smokeless tobacco. He reports current alcohol use. He reports that  he does not use drugs.   Family History:  The patient's family history includes Heart attack in his brother, mother, and sister; Stroke in his father.    ROS:  Please see the history of present illness.   Otherwise, review of systems are positive for none.   All other systems are reviewed and negative.    PHYSICAL EXAM: VS:  BP 130/80 (BP Location: Left Arm, Patient Position: Sitting, Cuff Size: Normal)    Pulse 63    Ht 5\' 10"  (1.778 m)    Wt 160 lb (72.6 kg)    SpO2 98%    BMI 22.96 kg/m  , BMI Body mass index is 22.96 kg/m. GEN: Well  nourished, well developed, in no acute distress  HEENT: normal  Neck: no JVD, carotid bruits, or masses Cardiac: RRR; no murmurs, rubs, or gallops,no edema  Respiratory:  clear to auscultation bilaterally, normal work of breathing GI: soft, nontender, nondistended, + BS MS: no deformity or atrophy  Skin: warm and dry, no rash Neuro:  Strength and sensation are intact Psych: euthymic mood, full affect    EKG:  EKG is ordered today. The ekg ordered today demonstrates normal sinus rhythm with old inferior infarct and nonspecific ST changes.   Recent Labs: No results found for requested labs within last 8760 hours.    Lipid Panel    Component Value Date/Time   CHOL 100 02/23/2020 1345   CHOL 225 (H) 11/14/2019 1434   TRIG 120 02/23/2020 1345   HDL 18 (L) 02/23/2020 1345   HDL 24 (L) 11/14/2019 1434   CHOLHDL 5.6 02/23/2020 1345   VLDL 24 02/23/2020 1345   LDLCALC 58 02/23/2020 1345   LDLCALC 148 (H) 11/14/2019 1434   LDLDIRECT 105 (H) 09/28/2018 1502      Wt Readings from Last 3 Encounters:  10/17/21 160 lb (72.6 kg)  05/16/20 151 lb 9.6 oz (68.8 kg)  11/14/19 151 lb 8 oz (68.7 kg)      No flowsheet data found.    ASSESSMENT AND PLAN:  1.  Coronary artery disease involving native coronary arteries without angina: He is doing well overall with no anginal symptoms.  Continue medical therapy.   Continue aspirin indefinitely.  I refilled his Toprol.  2.  Hyperlipidemia: No recent lab.  I requested lipid and liver profile.  I refilled atorvastatin and ezetimibe.  3.  Tobacco use: I had a prolonged discussion with him about the importance of smoking cessation.  Unfortunately, he reports inability to quit citing that he started smoking when he was 56 years old and has been smoking almost 2 packs/day for more than 45 years.    Disposition:   FU with me in 12 months  Signed,  5, MD  10/17/2021 8:32 AM    [facility]

## 2021-10-18 LAB — COMPREHENSIVE METABOLIC PANEL
ALT: 30 IU/L (ref 0–44)
AST: 25 IU/L (ref 0–40)
Albumin/Globulin Ratio: 2.1 (ref 1.2–2.2)
Albumin: 4.6 g/dL (ref 3.8–4.9)
Alkaline Phosphatase: 92 IU/L (ref 44–121)
BUN/Creatinine Ratio: 15 (ref 9–20)
BUN: 15 mg/dL (ref 6–24)
Bilirubin Total: 0.3 mg/dL (ref 0.0–1.2)
CO2: 19 mmol/L — ABNORMAL LOW (ref 20–29)
Calcium: 9.5 mg/dL (ref 8.7–10.2)
Chloride: 101 mmol/L (ref 96–106)
Creatinine, Ser: 1.03 mg/dL (ref 0.76–1.27)
Globulin, Total: 2.2 g/dL (ref 1.5–4.5)
Glucose: 159 mg/dL — ABNORMAL HIGH (ref 70–99)
Potassium: 3.7 mmol/L (ref 3.5–5.2)
Sodium: 139 mmol/L (ref 134–144)
Total Protein: 6.8 g/dL (ref 6.0–8.5)
eGFR: 86 mL/min/{1.73_m2} (ref >59)

## 2021-10-18 LAB — LIPID PANEL
Chol/HDL Ratio: 6.5 ratio — ABNORMAL HIGH (ref 0.0–5.0)
Cholesterol, Total: 117 mg/dL (ref 100–199)
HDL: 18 mg/dL — ABNORMAL LOW (ref >39)
LDL Chol Calc (NIH): 63 mg/dL (ref 0–99)
Triglycerides: 216 mg/dL — ABNORMAL HIGH (ref 0–149)
VLDL Cholesterol Cal: 36 mg/dL (ref 5–40)

## 2022-09-09 ENCOUNTER — Other Ambulatory Visit: Payer: Self-pay | Admitting: Cardiovascular Disease

## 2022-10-27 ENCOUNTER — Other Ambulatory Visit: Payer: Self-pay | Admitting: Cardiovascular Disease

## 2022-12-07 ENCOUNTER — Emergency Department: Payer: 59

## 2022-12-07 ENCOUNTER — Inpatient Hospital Stay
Admission: EM | Admit: 2022-12-07 | Discharge: 2022-12-10 | DRG: 419 | Disposition: A | Discharge status: Home / Self Care | Payer: 59 | Attending: Student | Admitting: Student

## 2022-12-07 ENCOUNTER — Encounter: Payer: Self-pay | Admitting: Emergency Medicine

## 2022-12-07 ENCOUNTER — Other Ambulatory Visit: Payer: Self-pay

## 2022-12-07 DIAGNOSIS — I2510 Atherosclerotic heart disease of native coronary artery without angina pectoris: Secondary | ICD-10-CM | POA: Diagnosis present

## 2022-12-07 DIAGNOSIS — K8065 Calculus of gallbladder and bile duct with chronic cholecystitis with obstruction: Principal | ICD-10-CM | POA: Diagnosis present

## 2022-12-07 DIAGNOSIS — F17210 Nicotine dependence, cigarettes, uncomplicated: Secondary | ICD-10-CM | POA: Diagnosis present

## 2022-12-07 DIAGNOSIS — R1013 Epigastric pain: Secondary | ICD-10-CM

## 2022-12-07 DIAGNOSIS — F319 Bipolar disorder, unspecified: Secondary | ICD-10-CM | POA: Diagnosis present

## 2022-12-07 DIAGNOSIS — K8050 Calculus of bile duct without cholangitis or cholecystitis without obstruction: Secondary | ICD-10-CM | POA: Diagnosis present

## 2022-12-07 DIAGNOSIS — Z823 Family history of stroke: Secondary | ICD-10-CM | POA: Diagnosis not present

## 2022-12-07 DIAGNOSIS — Z886 Allergy status to analgesic agent status: Secondary | ICD-10-CM

## 2022-12-07 DIAGNOSIS — Z716 Tobacco abuse counseling: Secondary | ICD-10-CM | POA: Diagnosis not present

## 2022-12-07 DIAGNOSIS — Z8711 Personal history of peptic ulcer disease: Secondary | ICD-10-CM | POA: Diagnosis not present

## 2022-12-07 DIAGNOSIS — I10 Essential (primary) hypertension: Secondary | ICD-10-CM | POA: Diagnosis present

## 2022-12-07 DIAGNOSIS — Z955 Presence of coronary angioplasty implant and graft: Secondary | ICD-10-CM | POA: Diagnosis not present

## 2022-12-07 DIAGNOSIS — R079 Chest pain, unspecified: Secondary | ICD-10-CM

## 2022-12-07 DIAGNOSIS — Z7982 Long term (current) use of aspirin: Secondary | ICD-10-CM | POA: Diagnosis not present

## 2022-12-07 DIAGNOSIS — K219 Gastro-esophageal reflux disease without esophagitis: Secondary | ICD-10-CM

## 2022-12-07 DIAGNOSIS — E785 Hyperlipidemia, unspecified: Secondary | ICD-10-CM | POA: Diagnosis present

## 2022-12-07 DIAGNOSIS — K8064 Calculus of gallbladder and bile duct with chronic cholecystitis without obstruction: Secondary | ICD-10-CM | POA: Diagnosis not present

## 2022-12-07 DIAGNOSIS — J449 Chronic obstructive pulmonary disease, unspecified: Secondary | ICD-10-CM | POA: Insufficient documentation

## 2022-12-07 DIAGNOSIS — K8021 Calculus of gallbladder without cholecystitis with obstruction: Secondary | ICD-10-CM

## 2022-12-07 DIAGNOSIS — Z79899 Other long term (current) drug therapy: Secondary | ICD-10-CM

## 2022-12-07 DIAGNOSIS — I252 Old myocardial infarction: Secondary | ICD-10-CM | POA: Diagnosis not present

## 2022-12-07 DIAGNOSIS — I214 Non-ST elevation (NSTEMI) myocardial infarction: Secondary | ICD-10-CM | POA: Diagnosis present

## 2022-12-07 DIAGNOSIS — E876 Hypokalemia: Secondary | ICD-10-CM | POA: Diagnosis not present

## 2022-12-07 DIAGNOSIS — Z8249 Family history of ischemic heart disease and other diseases of the circulatory system: Secondary | ICD-10-CM

## 2022-12-07 LAB — CBC
HCT: 40.6 % (ref 39.0–52.0)
Hemoglobin: 13.9 g/dL (ref 13.0–17.0)
MCH: 31.5 pg (ref 26.0–34.0)
MCHC: 34.2 g/dL (ref 30.0–36.0)
MCV: 92.1 fL (ref 80.0–100.0)
Platelets: 151 10*3/uL (ref 150–400)
RBC: 4.41 MIL/uL (ref 4.22–5.81)
RDW: 13 % (ref 11.5–15.5)
WBC: 8.5 10*3/uL (ref 4.0–10.5)
nRBC: 0 % (ref 0.0–0.2)

## 2022-12-07 LAB — D-DIMER, QUANTITATIVE: D-Dimer, Quant: 0.31 ug/mL-FEU (ref 0.00–0.50)

## 2022-12-07 LAB — COMPREHENSIVE METABOLIC PANEL
ALT: 63 U/L — ABNORMAL HIGH (ref 0–44)
AST: 115 U/L — ABNORMAL HIGH (ref 15–41)
Albumin: 4 g/dL (ref 3.5–5.0)
Alkaline Phosphatase: 95 U/L (ref 38–126)
Anion gap: 7 (ref 5–15)
BUN: 15 mg/dL (ref 6–20)
CO2: 25 mmol/L (ref 22–32)
Calcium: 8.9 mg/dL (ref 8.9–10.3)
Chloride: 107 mmol/L (ref 98–111)
Creatinine, Ser: 1.23 mg/dL (ref 0.61–1.24)
GFR, Estimated: >60 mL/min (ref >60)
Glucose, Bld: 123 mg/dL — ABNORMAL HIGH (ref 70–99)
Potassium: 3.3 mmol/L — ABNORMAL LOW (ref 3.5–5.1)
Sodium: 139 mmol/L (ref 135–145)
Total Bilirubin: 0.9 mg/dL (ref 0.3–1.2)
Total Protein: 6.3 g/dL — ABNORMAL LOW (ref 6.5–8.1)

## 2022-12-07 LAB — TROPONIN I (HIGH SENSITIVITY)
Troponin I (High Sensitivity): 7 ng/L (ref <18)
Troponin I (High Sensitivity): 7 ng/L (ref <18)

## 2022-12-07 LAB — LIPASE, BLOOD: Lipase: 35 U/L (ref 11–51)

## 2022-12-07 LAB — HIV ANTIBODY (ROUTINE TESTING W REFLEX): HIV Screen 4th Generation wRfx: NONREACTIVE

## 2022-12-07 MED ORDER — MORPHINE SULFATE (PF) 2 MG/ML IV SOLN: 2.0000 mg | INTRAVENOUS | Status: DC | PRN

## 2022-12-07 MED ORDER — ENOXAPARIN SODIUM 40 MG/0.4ML IJ SOSY: 40.0000 mg | PREFILLED_SYRINGE | INTRAMUSCULAR | Status: DC

## 2022-12-07 MED ORDER — ASPIRIN 81 MG PO TBEC: 81.0000 mg | DELAYED_RELEASE_TABLET | Freq: Once | ORAL | Status: DC

## 2022-12-07 MED ORDER — DIAZEPAM 5 MG/ML IJ SOLN
2.0000 mg | Freq: Once | INTRAMUSCULAR | Status: AC
  Administered 2022-12-07: 2 mg via INTRAVENOUS
  Filled 2022-12-07: qty 2

## 2022-12-07 MED ORDER — FAMOTIDINE IN NACL 20-0.9 MG/50ML-% IV SOLN
20.0000 mg | Freq: Once | INTRAVENOUS | Status: DC
  Filled 2022-12-07: qty 50

## 2022-12-07 MED ORDER — POTASSIUM CHLORIDE IN NACL 20-0.9 MEQ/L-% IV SOLN
INTRAVENOUS | Status: DC
  Filled 2022-12-07 (×3): qty 1000

## 2022-12-07 MED ORDER — ONDANSETRON HCL 4 MG/2ML IJ SOLN
4.0000 mg | Freq: Four times a day (QID) | INTRAMUSCULAR | Status: DC | PRN
  Administered 2022-12-09: 4 mg via INTRAVENOUS

## 2022-12-07 MED ORDER — ONDANSETRON HCL 4 MG/2ML IJ SOLN
4.0000 mg | Freq: Once | INTRAMUSCULAR | Status: AC
  Administered 2022-12-08: 4 mg via INTRAVENOUS

## 2022-12-07 MED ORDER — TIOTROPIUM BROMIDE MONOHYDRATE 18 MCG IN CAPS: 18.0000 ug | ORAL_CAPSULE | Freq: Every day | RESPIRATORY_TRACT | Status: DC

## 2022-12-07 MED ORDER — DEXTROSE-NACL 5-0.45 % IV SOLN: INTRAVENOUS | Status: DC

## 2022-12-07 MED ORDER — NICOTINE 21 MG/24HR TD PT24
21.0000 mg | MEDICATED_PATCH | Freq: Every day | TRANSDERMAL | Status: DC
  Filled 2022-12-07: qty 1

## 2022-12-07 MED ORDER — METOPROLOL SUCCINATE ER 50 MG PO TB24
50.0000 mg | ORAL_TABLET | Freq: Every day | ORAL | Status: DC
  Administered 2022-12-09 – 2022-12-10 (×2): 50 mg via ORAL
  Filled 2022-12-07 (×3): qty 1

## 2022-12-07 MED ORDER — ASPIRIN 81 MG PO CHEW: 81.0000 mg | CHEWABLE_TABLET | Freq: Every day | ORAL | Status: DC

## 2022-12-07 MED ORDER — MORPHINE SULFATE (PF) 4 MG/ML IV SOLN
4.0000 mg | Freq: Once | INTRAVENOUS | Status: AC
  Administered 2022-12-07: 4 mg via INTRAVENOUS
  Filled 2022-12-07: qty 1

## 2022-12-07 MED ORDER — ONDANSETRON HCL 4 MG/2ML IJ SOLN
4.0000 mg | Freq: Once | INTRAMUSCULAR | Status: AC
  Administered 2022-12-07: 4 mg via INTRAVENOUS
  Filled 2022-12-07: qty 2

## 2022-12-07 MED ORDER — ONDANSETRON HCL 4 MG PO TABS
4.0000 mg | ORAL_TABLET | Freq: Four times a day (QID) | ORAL | Status: DC | PRN
Start: 2022-12-07 — End: 2022-12-07

## 2022-12-07 MED ORDER — TRAZODONE HCL 50 MG PO TABS
50.0000 mg | ORAL_TABLET | Freq: Every evening | ORAL | Status: DC | PRN
  Administered 2022-12-09: 50 mg via ORAL
  Filled 2022-12-07: qty 1

## 2022-12-07 MED ORDER — ASPIRIN 81 MG PO CHEW
CHEWABLE_TABLET | ORAL | Status: AC
  Administered 2022-12-07: 243 mg
  Filled 2022-12-07: qty 3

## 2022-12-07 MED ORDER — ATORVASTATIN CALCIUM 20 MG PO TABS
80.0000 mg | ORAL_TABLET | Freq: Every day | ORAL | Status: DC
  Administered 2022-12-10: 80 mg via ORAL
  Filled 2022-12-07 (×2): qty 4

## 2022-12-07 MED ORDER — EZETIMIBE 10 MG PO TABS
10.0000 mg | ORAL_TABLET | Freq: Every day | ORAL | Status: DC
  Administered 2022-12-10: 10 mg via ORAL
  Filled 2022-12-07 (×3): qty 1

## 2022-12-07 NOTE — Progress Notes (Signed)
Received call from nursing about patient being upset as no one came to see him today. Patient threatening to leave AMA.  In review of the chart, GI consult was not appropriate done in ER.  I personally discussed the case with Dr. Allen Norris.  He will plan to see the patient in the morning for likely ERCP in the afternoon.  Dr. Haig Prophet with gastroenterology will formally evaluate the point patient in the morning also. Patient was upset about not being able to eat and also feeling fairly stressed out in setting of chain smoking. Per Dr. Allen Norris, patient okay to eat now.  N.p.o. at midnight Patient will go outside to smoke as needed pending further evaluation. Patient as well as his family was at the bedside expressed understanding with the apparent miscommunication. Patient is agreeable to stay overnight with eating dinner with plan for n.p.o. after midnight and planned ERCP with Dr. Allen Norris tomorrow. Follow closely

## 2022-12-07 NOTE — Assessment & Plan Note (Addendum)
Noted prior hx/o CAD status post MI and stent in 2019  + central chest pain on admission secondary to choledocholithiasis S/p full dose ASA  Trop neg, EKG WNL  Cont home regimen  Follow

## 2022-12-07 NOTE — ED Provider Notes (Signed)
[facility] Provider Note    Event Date/Time   First MD Initiated Contact with Patient 12/07/22 0114     (approximate)   History   Chest Pain   HPI  Thomas Romero is a 57 y.o. male who presents to the ED from home with a chief complaint of chest pain.  Patient with a history of CAD status post MI and stent in 2019, hyperlipidemia, PUD.  Reports central sharp, nonradiating chest pain which began around 12:30 AM while he was going to sleep.  Initially pain was 6/10.  Patient took a nitroglycerin and his nighttime single baby aspirin.  Initial relief of pain but it has returned and currently is 7/10.  Denies associated diaphoresis, palpitations, nausea/vomiting or dizziness.  Denies recent fever/chills, cough, abdominal pain, trauma, travel or hormone use.     Past Medical History   Past Medical History:  Diagnosis Date   Coronary artery disease    Non-ST elevation myocardial infarction in November 2019.  Cardiac catheterization showed 99% stenosis in the mid RCA treated successfully with PCI and drug-eluting stent placement in moderate mid left circumflex disease.   Gastric ulcer    Hyperlipidemia    Manic depression    Panic attacks    Tobacco use      Active Problem List   Patient Active Problem List   Diagnosis Date Noted   Left lower quadrant abdominal pain 10/25/2018   Non-ST elevation (NSTEMI) myocardial infarction 07/25/2018   Anxiety 07/25/2018   Depression 07/25/2018     Past Surgical History   Past Surgical History:  Procedure Laterality Date   CORONARY/GRAFT ACUTE MI REVASCULARIZATION N/A 07/25/2018   Procedure: Coronary/Graft Acute MI Revascularization;  Surgeon: Wellington Hampshire, MD;  Location: [facility];  Service: Cardiovascular;  Laterality: N/A;   LEFT HEART CATH AND CORONARY ANGIOGRAPHY N/A 07/25/2018   Procedure: LEFT HEART CATH AND CORONARY ANGIOGRAPHY;  Surgeon: Wellington Hampshire, MD;  Location: [facility];  Service: Cardiovascular;  Laterality: N/A;   SMALL INTESTINE SURGERY       Home Medications   Prior to Admission medications   Medication Sig Start Date End Date Taking? Authorizing Provider  albuterol (VENTOLIN HFA) 108 (90 Base) MCG/ACT inhaler Inhale 1-2 puffs into the lungs as needed. 09/09/21  Yes [provider]  aspirin 81 MG chewable tablet Chew 1 tablet (81 mg total) by mouth daily. 07/26/18  Yes Wellington Hampshire, MD  atorvastatin (LIPITOR) 80 MG tablet Take 1 tablet by mouth once daily 10/27/22  Yes Wellington Hampshire, MD  ezetimibe (ZETIA) 10 MG tablet Take 1 tablet by mouth once daily 09/09/22  Yes Wellington Hampshire, MD  metoprolol succinate (TOPROL-XL) 50 MG 24 hr tablet Take 1 tablet (50 mg total) by mouth daily. Take with or immediately following a meal. 10/17/21  Yes Wellington Hampshire, MD  nitroGLYCERIN (NITROSTAT) 0.4 MG SL tablet Place 1 tablet (0.4 mg total) under the tongue every 5 (five) minutes as needed for chest pain. 05/16/20  Yes Wellington Hampshire, MD  SPIRIVA HANDIHALER 18 MCG inhalation capsule Place 18 mcg into inhaler and inhale daily. 10/26/22  Yes [provider]  traZODone (DESYREL) 50 MG tablet Take 50 mg by mouth at bedtime as needed for sleep.   Yes [provider]     Allergies  Ibuprofen   Family History   Family History  Problem Relation Age of Onset   Heart attack Mother  Stroke Father    Heart attack Sister    Heart attack Brother      Physical Exam  Triage Vital Signs: ED Triage Vitals [12/07/22 0112]  Enc Vitals Group     BP 136/79     Pulse Rate 95     Resp 18     Temp 97.9 F (36.6 C)     Temp Source Oral     SpO2 97 %     Weight      Height      Head Circumference      Peak Flow      Pain Score 7     Pain Loc      Pain Edu?      Excl. in [place]?     Updated Vital Signs: BP (!) 114/55   Pulse 63   Temp 97.9 F (36.6 C) (Oral)   Resp 16   SpO2 97%    General: Awake, no distress.   CV:  RRR.  Good peripheral perfusion.  Resp:  Normal effort.  CTAB.  Mildly tender to distal sternum. Abd:  Mild tenderness to epigastrium without rebound or guarding.  No truncal vesicles.  No distention.  Other:  Bilateral calves are supple without tenderness.   ED Results / Procedures / Treatments  Labs (all labs ordered are listed, but only abnormal results are displayed) Labs Reviewed  COMPREHENSIVE METABOLIC PANEL - Abnormal; Notable for the following components:      Result Value   Potassium 3.3 (*)    Glucose, Bld 123 (*)    Total Protein 6.3 (*)    AST 115 (*)    ALT 63 (*)    All other components within normal limits  CBC  LIPASE, BLOOD  D-DIMER, QUANTITATIVE  TROPONIN I (HIGH SENSITIVITY)  TROPONIN I (HIGH SENSITIVITY)     EKG  ED ECG REPORT I, Jillann Charette J, the attending physician, personally viewed and interpreted this ECG.   Date: 12/07/2022  EKG Time: 0112  Rate: 87  Rhythm: normal sinus rhythm  Axis: Normal  Intervals:none  ST&T Change: Nonspecific    RADIOLOGY I have independently visualized and interpreted patient's chest x-ray as well as noted the radiology interpretation:  X-ray: No acute cardiopulmonary process  Ultrasound: Cholelithiasis with probable ductal dilatation  MRCP: Choledocholithiasis with very mild distal common bile duct dilation  Official radiology report(s): MR ABDOMEN MRCP WO CONTRAST  Result Date: 12/07/2022 CLINICAL DATA:  57 year old male with history of abdominal pain. Cholelithiasis on ultrasound. Evaluate for choledocholithiasis and biliary obstruction. EXAM: MRI ABDOMEN WITHOUT CONTRAST  (INCLUDING MRCP) TECHNIQUE: Multiplanar multisequence MR imaging of the abdomen was performed. Heavily T2-weighted images of the biliary and pancreatic ducts were obtained, and three-dimensional MRCP images were rendered by post processing. COMPARISON:  No prior abdominal MRI. Right upper quadrant abdominal ultrasound 12/07/2022. CT of  the abdomen and pelvis 10/25/2018. FINDINGS: Comment: Today's study is limited for detection and characterization of visceral and/or vascular lesions by lack of IV gadolinium. Additionally, extensive respiratory motion severely limits portions of this examination, including some MRCP sequences. Lower chest: Unremarkable. Hepatobiliary: In the central aspect of segment 8 of the liver (axial image 9 of series 4) there is a well-circumscribed 8 mm T1 hypointense and T2 hyperintense lesion, likely a tiny cyst or biliary hamartoma but incompletely characterized on today's noncontrast examination (no imaging follow-up recommended). No other larger more suspicious appearingcystic or solid hepatic lesions are confidently identified on today's noncontrast examination. No intrahepatic biliary ductal dilatation.  Gallbladder is nearly completely contracted around indwelling filling defects, presumably gallstones, largest of which measures 1.9 x 1.4 cm. MRCP images demonstrate mild dilatation of the common bile duct which measures up to 7 mm in the porta hepatis. There also appears to be a small filling defect measuring approximately 5 x 3 mm in the distal aspect of the common bile duct shortly before the ampulla (coronal MRCP image 12 of series 17), indicative of choledocholithiasis. Pancreas: No definite pancreatic mass or peripancreatic fluid collections or inflammatory changes noted on today's examination. No pancreatic ductal dilatation noted on MRCP imaging. Spleen:  Unremarkable. Adrenals/Urinary Tract: Right kidney and bilateral adrenal glands are normal in appearance. Multiple T1 hypointense, T2 hyperintense lesions in the kidneys, likely to represent cysts (incompletely characterized on today's noncontrast examination), largest of which in the upper pole of the left kidney (axial image 17 of series 4) measures up to 2.0 x 1.5 cm and has several thin internal septations (likely Bosniak class 2, no imaging follow-up  recommended). No hydroureteronephrosis in the visualized portions of the abdomen. Stomach/Bowel: Visualized portions are unremarkable. Vascular/Lymphatic: No definite aneurysm identified in the visualized abdominal vasculature. No lymphadenopathy noted in the abdomen. Other: No significant volume of ascites noted in the visualized portions of the peritoneal cavity. Musculoskeletal: No aggressive appearing osseous lesions are noted in the visualized portions of the skeleton. IMPRESSION: 1. Study is positive for choledocholithiasis with very mild common bile duct dilatation, but no frank intrahepatic biliary ductal dilatation at this time. Findings suggest mild obstruction of the distal common bile duct. 2. Cholelithiasis without evidence of acute cholecystitis. 3. Additional incidental findings, as above. Electronically Signed   By: Vinnie Langton M.D.   On: 12/07/2022 05:53   MR 3D Recon At Scanner  Result Date: 12/07/2022 CLINICAL DATA:  57 year old male with history of abdominal pain. Cholelithiasis on ultrasound. Evaluate for choledocholithiasis and biliary obstruction. EXAM: MRI ABDOMEN WITHOUT CONTRAST  (INCLUDING MRCP) TECHNIQUE: Multiplanar multisequence MR imaging of the abdomen was performed. Heavily T2-weighted images of the biliary and pancreatic ducts were obtained, and three-dimensional MRCP images were rendered by post processing. COMPARISON:  No prior abdominal MRI. Right upper quadrant abdominal ultrasound 12/07/2022. CT of the abdomen and pelvis 10/25/2018. FINDINGS: Comment: Today's study is limited for detection and characterization of visceral and/or vascular lesions by lack of IV gadolinium. Additionally, extensive respiratory motion severely limits portions of this examination, including some MRCP sequences. Lower chest: Unremarkable. Hepatobiliary: In the central aspect of segment 8 of the liver (axial image 9 of series 4) there is a well-circumscribed 8 mm T1 hypointense and T2  hyperintense lesion, likely a tiny cyst or biliary hamartoma but incompletely characterized on today's noncontrast examination (no imaging follow-up recommended). No other larger more suspicious appearingcystic or solid hepatic lesions are confidently identified on today's noncontrast examination. No intrahepatic biliary ductal dilatation. Gallbladder is nearly completely contracted around indwelling filling defects, presumably gallstones, largest of which measures 1.9 x 1.4 cm. MRCP images demonstrate mild dilatation of the common bile duct which measures up to 7 mm in the porta hepatis. There also appears to be a small filling defect measuring approximately 5 x 3 mm in the distal aspect of the common bile duct shortly before the ampulla (coronal MRCP image 12 of series 17), indicative of choledocholithiasis. Pancreas: No definite pancreatic mass or peripancreatic fluid collections or inflammatory changes noted on today's examination. No pancreatic ductal dilatation noted on MRCP imaging. Spleen:  Unremarkable. Adrenals/Urinary Tract: Right kidney and bilateral adrenal  glands are normal in appearance. Multiple T1 hypointense, T2 hyperintense lesions in the kidneys, likely to represent cysts (incompletely characterized on today's noncontrast examination), largest of which in the upper pole of the left kidney (axial image 17 of series 4) measures up to 2.0 x 1.5 cm and has several thin internal septations (likely Bosniak class 2, no imaging follow-up recommended). No hydroureteronephrosis in the visualized portions of the abdomen. Stomach/Bowel: Visualized portions are unremarkable. Vascular/Lymphatic: No definite aneurysm identified in the visualized abdominal vasculature. No lymphadenopathy noted in the abdomen. Other: No significant volume of ascites noted in the visualized portions of the peritoneal cavity. Musculoskeletal: No aggressive appearing osseous lesions are noted in the visualized portions of the  skeleton. IMPRESSION: 1. Study is positive for choledocholithiasis with very mild common bile duct dilatation, but no frank intrahepatic biliary ductal dilatation at this time. Findings suggest mild obstruction of the distal common bile duct. 2. Cholelithiasis without evidence of acute cholecystitis. 3. Additional incidental findings, as above. Electronically Signed   By: Vinnie Langton M.D.   On: 12/07/2022 05:53   US ABDOMEN LIMITED RUQ (LIVER/GB)  Result Date: 12/07/2022 CLINICAL DATA:  Right upper quadrant abdominal pain, elevated liver enzymes EXAM: ULTRASOUND ABDOMEN LIMITED RIGHT UPPER QUADRANT COMPARISON:  CT and sonogram 10/25/2018 FINDINGS: Gallbladder: The gallbladder is contracted contributing to apparent gallbladder wall thickening. Cholelithiasis noted with dense shadowing. No significant pericholecystic fluid is identified. The sonographic Percell Miller sign is reportedly negative. Common bile duct: Diameter: 7 mm in proximal diameter Liver: 7 mm simple cyst noted within the right hepatic lobe. No solid intrahepatic mass identified. No intrahepatic biliary ductal dilation. Normal parenchymal echogenicity and echotexture. Portal vein is patent on color Doppler imaging with normal direction of blood flow towards the liver. Other: None. IMPRESSION: 1. Cholelithiasis without sonographic evidence of acute cholecystitis. 2. Mild dilation of the extrahepatic bile duct, slightly progressive since prior examination. If there is clinical concern for a distal obstructing lesion, such as choledocholithiasis, ERCP or MRCP examination is recommended for further evaluation. Electronically Signed   By: Fidela Salisbury M.D.   On: 12/07/2022 03:15   DG Chest 2 View  Result Date: 12/07/2022 CLINICAL DATA:  Chest pain EXAM: CHEST - 2 VIEW COMPARISON:  07/24/2018 FINDINGS: The heart size and mediastinal contours are within normal limits. Both lungs are clear. The visualized skeletal structures are unremarkable.  IMPRESSION: No active cardiopulmonary disease. Electronically Signed   By: Ulyses Jarred M.D.   On: 12/07/2022 02:08     PROCEDURES:  Critical Care performed: Yes, see critical care procedure note(s)  CRITICAL CARE Performed by: Paulette Blanch   Total critical care time: 45 minutes  Critical care time was exclusive of separately billable procedures and treating other patients.  Critical care was necessary to treat or prevent imminent or life-threatening deterioration.  Critical care was time spent personally by me on the following activities: development of treatment plan with patient and/or surrogate as well as nursing, discussions with consultants, evaluation of patient's response to treatment, examination of patient, obtaining history from patient or surrogate, ordering and performing treatments and interventions, ordering and review of laboratory studies, ordering and review of radiographic studies, pulse oximetry and re-evaluation of patient's condition.   Marland Kitchen1-3 Lead EKG Interpretation  Performed by: Paulette Blanch, MD Authorized by: Paulette Blanch, MD     Interpretation: normal     ECG rate:  95   ECG rate assessment: normal     Rhythm: sinus rhythm  Ectopy: none     Conduction: normal   Comments:     Placed on cardiac monitor to evaluate for arrhythmias    MEDICATIONS ORDERED IN ED: Medications  famotidine (PEPCID) IVPB 20 mg premix (20 mg Intravenous Not Given 12/07/22 0135)  aspirin EC tablet 81 mg (81 mg Oral Not Given 12/07/22 0135)  aspirin EC tablet 81 mg (81 mg Oral Not Given 12/07/22 0135)  aspirin EC tablet 81 mg (81 mg Oral Not Given 12/07/22 0136)  ondansetron (ZOFRAN) injection 4 mg (0 mg Intravenous Hold 12/07/22 0236)  ondansetron (ZOFRAN) injection 4 mg (4 mg Intravenous Given 12/07/22 0125)  morphine (PF) 4 MG/ML injection 4 mg (4 mg Intravenous Given 12/07/22 0125)  aspirin 81 MG chewable tablet (243 mg  Given 12/07/22 0124)  morphine (PF) 4 MG/ML injection 4 mg (4 mg  Intravenous Given 12/07/22 0235)  diazepam (VALIUM) injection 2 mg (2 mg Intravenous Given 12/07/22 0435)     IMPRESSION / MDM / ASSESSMENT AND PLAN / ED COURSE  I reviewed the triage vital signs and the nursing notes.                             57 year old male presenting with chest pain. Differential diagnosis includes, but is not limited to, ACS, aortic dissection, pulmonary embolism, cardiac tamponade, pneumothorax, pneumonia, pericarditis, myocarditis, GI-related causes including esophagitis/gastritis, and musculoskeletal chest wall pain.   I personally reviewed patient's records and note his cardiology office visit from February 2023.  Patient's presentation is most consistent with acute presentation with potential threat to life or bodily function.  The patient is on the cardiac monitor to evaluate for evidence of arrhythmia and/or significant heart rate changes.  Will obtain cardiac panel, chest x-ray.  Administer 3 baby aspirin, IV morphine for pain paired with IV Zofran to prevent nausea.  Will reassess.  Clinical Course as of 12/07/22 0622  Mon Dec 07, 2022  0227 Patient reports morphine helped his pain but is now returning.  Updated patient and spouse on laboratory results including normal WBC 8.5, mild hypokalemia with potassium 3.3, mild elevation of transaminases with normal lipase.  Initial negative troponin and D-dimer.  Chest x-ray is clear.  Will redose morphine and obtain right upper quadrant abdominal ultrasound as well as repeat troponin. [JS]  [id number] Patient resting comfortably, drinking orange juice.  Repeat troponin negative.  Updated patient and spouse of ultrasound results demonstrating cholelithiasis with active dilatation.  We discussed whether or not to proceed with MRCP.  Given that patient's transaminases are mildly elevated from prior, coupled with ultrasound results, we will proceed with MRCP.  Patient requires anxiolytic for the test. [JS]  0608 MRCP positive for  choledocholithiasis; will discuss with GI on-call.  Patient updated and agreeable for hospitalization. [JS]  [id number] No answer from Dr. Haig Prophet who is on-call GI for unassigned patients.  Spoke with Dr. Vicente Males from GI who confirms Dr. Allen Norris will be available for ERCP today.  Will consult hospitalist services for evaluation and admission. [JS]    Clinical Course User Index [JS] Paulette Blanch, MD     FINAL CLINICAL IMPRESSION(S) / ED DIAGNOSES   Final diagnoses:  Nonspecific chest pain  Calculus of gallbladder with biliary obstruction but without cholecystitis  Epigastric pain  Choledocholithiasis     Rx / DC Orders   ED Discharge Orders     None        Note:  This document was  prepared using Systems analyst and may include unintentional dictation errors.   Paulette Blanch, MD 12/07/22 [phone]

## 2022-12-07 NOTE — ED Triage Notes (Signed)
Pt arrived via POV with reports of CP that started around 1230am when he was going to sleep, pt states he took 1 NTG and pain eased off but when he laid down the pain got worse.  Pt reports hx of MI with stent placement. Last MI 2019.  Pt alert and oriented at this time, speech clear and no respiratory distress noted.

## 2022-12-07 NOTE — Plan of Care (Signed)
  Problem: Nutrition: Goal: Adequate nutrition will be maintained Outcome: Progressing   Problem: Safety: Goal: Ability to remain free from injury will improve Outcome: Progressing   

## 2022-12-07 NOTE — ED Notes (Signed)
Pt ambulatory independently to bathroom 

## 2022-12-07 NOTE — H&P (Signed)
History and Physical    Patient: Thomas Romero DOB: 30-Dec-1965 DOA: 12/07/2022 DOS: the patient was seen and examined on 12/07/2022 PCP: [facility] [place]  Patient coming from: Home  Chief Complaint:  Chief Complaint  Patient presents with   Chest Pain   HPI: Thomas Romero is a 57 y.o. male with medical history significant of coronary artery disease, tobacco abuse, hyperlipidemia, depression, likely obstructive lung disease presenting with chest pain and choledocholithiasis.  Patient reports onset of central chest pain around midnight tonight.  Symptoms persisted despite treatment with as needed nitroglycerin and aspirin..  No reported radiation of the neck or down the left arm.  No associated diaphoresis.  Mild nausea.  No fevers or chills.  Minimal to mild abdominal pain.  No reported diarrhea.  Symptoms persisted despite treatment.  Still smoking 1 to 2 packs/day.  Patient reports smoking since he was 57 years old. Presented to the ER afebrile, hemodynamically stable.  Troponin negative x 2.  K3.3.  AST 115, ALT 63.  T. bili 0.9.  EKG normal sinus rhythm.  Chest x-ray stable.  Abdominal ultrasound showed cholelithiasis as well as mild dilatation of extrahepatic bile duct concerning for choledocholithiasis.  MRCP obtained being positive for choledocholithiasis with very mild common bile duct dilatation and cholelithiasis without evidence of acute cholecystitis.  Per report, Dr. Allen Norris available for ERCP today. Review of Systems: As mentioned in the history of present illness. All other systems reviewed and are negative. Past Medical History:  Diagnosis Date   Coronary artery disease    Non-ST elevation myocardial infarction in November 2019.  Cardiac catheterization showed 99% stenosis in the mid RCA treated successfully with PCI and drug-eluting stent placement in moderate mid left circumflex disease.   Gastric ulcer    Hyperlipidemia    Manic depression     Panic attacks    Tobacco use    Past Surgical History:  Procedure Laterality Date   CORONARY/GRAFT ACUTE MI REVASCULARIZATION N/A 07/25/2018   Procedure: Coronary/Graft Acute MI Revascularization;  Surgeon: Wellington Hampshire, MD;  Location: [place] [facility];  Service: Cardiovascular;  Laterality: N/A;   LEFT HEART CATH AND CORONARY ANGIOGRAPHY N/A 07/25/2018   Procedure: LEFT HEART CATH AND CORONARY ANGIOGRAPHY;  Surgeon: Wellington Hampshire, MD;  Location: [place] [facility];  Service: Cardiovascular;  Laterality: N/A;   SMALL INTESTINE SURGERY     Social History:  reports that he has been smoking cigarettes. He has a 72.00 pack-year smoking history. He has never used smokeless tobacco. He reports current alcohol use. He reports that he does not use drugs.  Allergies  Allergen Reactions   Ibuprofen Other (See Comments)    Gastric ulcers    Family History  Problem Relation Age of Onset   Heart attack Mother    Stroke Father    Heart attack Sister    Heart attack Brother     Prior to Admission medications   Medication Sig Start Date End Date Taking? Authorizing Provider  albuterol (VENTOLIN HFA) 108 (90 Base) MCG/ACT inhaler Inhale 1-2 puffs into the lungs as needed. 09/09/21  Yes [provider]  aspirin 81 MG chewable tablet Chew 1 tablet (81 mg total) by mouth daily. 07/26/18  Yes Wellington Hampshire, MD  atorvastatin (LIPITOR) 80 MG tablet Take 1 tablet by mouth once daily 10/27/22  Yes Wellington Hampshire, MD  ezetimibe (ZETIA) 10 MG tablet Take 1 tablet by mouth once daily 09/09/22  Yes  Wellington Hampshire, MD  metoprolol succinate (TOPROL-XL) 50 MG 24 hr tablet Take 1 tablet (50 mg total) by mouth daily. Take with or immediately following a meal. 10/17/21  Yes Wellington Hampshire, MD  nitroGLYCERIN (NITROSTAT) 0.4 MG SL tablet Place 1 tablet (0.4 mg total) under the tongue every 5 (five) minutes as needed for chest pain. 05/16/20  Yes Wellington Hampshire, MD  SPIRIVA HANDIHALER 18  MCG inhalation capsule Place 18 mcg into inhaler and inhale daily. 10/26/22  Yes [provider]  traZODone (DESYREL) 50 MG tablet Take 50 mg by mouth at bedtime as needed for sleep.   Yes [provider]    Physical Exam: Vitals:   12/07/22 0400 12/07/22 0430 12/07/22 0654 12/07/22 0711  BP: 123/78 (!) 114/55 113/68 114/70  Pulse: 73 63 (!) 57 (!) 57  Resp: 16 16 18 16   Temp:    97.7 F (36.5 C)  TempSrc:    Oral  SpO2: 96% 97% 94% 100%   Physical Exam Constitutional:      General: He is not in acute distress.    Appearance: He is normal weight.  HENT:     Head: Normocephalic and atraumatic.     Nose: Nose normal.     Mouth/Throat:     Mouth: Mucous membranes are moist.  Eyes:     Pupils: Pupils are equal, round, and reactive to light.  Cardiovascular:     Rate and Rhythm: Normal rate and regular rhythm.  Pulmonary:     Effort: Pulmonary effort is normal.     Breath sounds: Normal breath sounds.  Abdominal:     General: Bowel sounds are normal.     Tenderness: There is no abdominal tenderness.  Musculoskeletal:        General: Normal range of motion.     Cervical back: Normal range of motion.  Skin:    General: Skin is warm.  Neurological:     General: No focal deficit present.  Psychiatric:        Mood and Affect: Mood normal.     Data Reviewed:  There are no new results to review at this time. MR 3D Recon At Scanner CLINICAL DATA:  57 year old male with history of abdominal pain. Cholelithiasis on ultrasound. Evaluate for choledocholithiasis and biliary obstruction.  EXAM: MRI ABDOMEN WITHOUT CONTRAST  (INCLUDING MRCP)  TECHNIQUE: Multiplanar multisequence MR imaging of the abdomen was performed. Heavily T2-weighted images of the biliary and pancreatic ducts were obtained, and three-dimensional MRCP images were rendered by post processing.  COMPARISON:  No prior abdominal MRI. Right upper quadrant abdominal ultrasound 12/07/2022. CT  of the abdomen and pelvis 10/25/2018.  FINDINGS: Comment: Today's study is limited for detection and characterization of visceral and/or vascular lesions by lack of IV gadolinium. Additionally, extensive respiratory motion severely limits portions of this examination, including some MRCP sequences.  Lower chest: Unremarkable.  Hepatobiliary: In the central aspect of segment 8 of the liver (axial image 9 of series 4) there is a well-circumscribed 8 mm T1 hypointense and T2 hyperintense lesion, likely a tiny cyst or biliary hamartoma but incompletely characterized on today's noncontrast examination (no imaging follow-up recommended). No other larger more suspicious appearingcystic or solid hepatic lesions are confidently identified on today's noncontrast examination. No intrahepatic biliary ductal dilatation. Gallbladder is nearly completely contracted around indwelling filling defects, presumably gallstones, largest of which measures 1.9 x 1.4 cm. MRCP images demonstrate mild dilatation of the common bile duct which measures up to 7  mm in the porta hepatis. There also appears to be a small filling defect measuring approximately 5 x 3 mm in the distal aspect of the common bile duct shortly before the ampulla (coronal MRCP image 12 of series 17), indicative of choledocholithiasis.  Pancreas: No definite pancreatic mass or peripancreatic fluid collections or inflammatory changes noted on today's examination. No pancreatic ductal dilatation noted on MRCP imaging.  Spleen:  Unremarkable.  Adrenals/Urinary Tract: Right kidney and bilateral adrenal glands are normal in appearance. Multiple T1 hypointense, T2 hyperintense lesions in the kidneys, likely to represent cysts (incompletely characterized on today's noncontrast examination), largest of which in the upper pole of the left kidney (axial image 17 of series 4) measures up to 2.0 x 1.5 cm and has several thin internal  septations (likely Bosniak class 2, no imaging follow-up recommended). No hydroureteronephrosis in the visualized portions of the abdomen.  Stomach/Bowel: Visualized portions are unremarkable.  Vascular/Lymphatic: No definite aneurysm identified in the visualized abdominal vasculature. No lymphadenopathy noted in the abdomen.  Other: No significant volume of ascites noted in the visualized portions of the peritoneal cavity.  Musculoskeletal: No aggressive appearing osseous lesions are noted in the visualized portions of the skeleton.  IMPRESSION: 1. Study is positive for choledocholithiasis with very mild common bile duct dilatation, but no frank intrahepatic biliary ductal dilatation at this time. Findings suggest mild obstruction of the distal common bile duct. 2. Cholelithiasis without evidence of acute cholecystitis. 3. Additional incidental findings, as above.  Electronically Signed   By: Vinnie Langton M.D.   On: 12/07/2022 05:53 MR ABDOMEN MRCP WO CONTRAST CLINICAL DATA:  57 year old male with history of abdominal pain. Cholelithiasis on ultrasound. Evaluate for choledocholithiasis and biliary obstruction.  EXAM: MRI ABDOMEN WITHOUT CONTRAST  (INCLUDING MRCP)  TECHNIQUE: Multiplanar multisequence MR imaging of the abdomen was performed. Heavily T2-weighted images of the biliary and pancreatic ducts were obtained, and three-dimensional MRCP images were rendered by post processing.  COMPARISON:  No prior abdominal MRI. Right upper quadrant abdominal ultrasound 12/07/2022. CT of the abdomen and pelvis 10/25/2018.  FINDINGS: Comment: Today's study is limited for detection and characterization of visceral and/or vascular lesions by lack of IV gadolinium. Additionally, extensive respiratory motion severely limits portions of this examination, including some MRCP sequences.  Lower chest: Unremarkable.  Hepatobiliary: In the central aspect of segment 8 of the  liver (axial image 9 of series 4) there is a well-circumscribed 8 mm T1 hypointense and T2 hyperintense lesion, likely a tiny cyst or biliary hamartoma but incompletely characterized on today's noncontrast examination (no imaging follow-up recommended). No other larger more suspicious appearingcystic or solid hepatic lesions are confidently identified on today's noncontrast examination. No intrahepatic biliary ductal dilatation. Gallbladder is nearly completely contracted around indwelling filling defects, presumably gallstones, largest of which measures 1.9 x 1.4 cm. MRCP images demonstrate mild dilatation of the common bile duct which measures up to 7 mm in the porta hepatis. There also appears to be a small filling defect measuring approximately 5 x 3 mm in the distal aspect of the common bile duct shortly before the ampulla (coronal MRCP image 12 of series 17), indicative of choledocholithiasis.  Pancreas: No definite pancreatic mass or peripancreatic fluid collections or inflammatory changes noted on today's examination. No pancreatic ductal dilatation noted on MRCP imaging.  Spleen:  Unremarkable.  Adrenals/Urinary Tract: Right kidney and bilateral adrenal glands are normal in appearance. Multiple T1 hypointense, T2 hyperintense lesions in the kidneys, likely to represent cysts (incompletely characterized on today's  noncontrast examination), largest of which in the upper pole of the left kidney (axial image 17 of series 4) measures up to 2.0 x 1.5 cm and has several thin internal septations (likely Bosniak class 2, no imaging follow-up recommended). No hydroureteronephrosis in the visualized portions of the abdomen.  Stomach/Bowel: Visualized portions are unremarkable.  Vascular/Lymphatic: No definite aneurysm identified in the visualized abdominal vasculature. No lymphadenopathy noted in the abdomen.  Other: No significant volume of ascites noted in the  visualized portions of the peritoneal cavity.  Musculoskeletal: No aggressive appearing osseous lesions are noted in the visualized portions of the skeleton.  IMPRESSION: 1. Study is positive for choledocholithiasis with very mild common bile duct dilatation, but no frank intrahepatic biliary ductal dilatation at this time. Findings suggest mild obstruction of the distal common bile duct. 2. Cholelithiasis without evidence of acute cholecystitis. 3. Additional incidental findings, as above.  Electronically Signed   By: Vinnie Langton M.D.   On: 12/07/2022 05:53 US ABDOMEN LIMITED RUQ (LIVER/GB) CLINICAL DATA:  Right upper quadrant abdominal pain, elevated liver enzymes  EXAM: ULTRASOUND ABDOMEN LIMITED RIGHT UPPER QUADRANT  COMPARISON:  CT and sonogram 10/25/2018  FINDINGS: Gallbladder:  The gallbladder is contracted contributing to apparent gallbladder wall thickening. Cholelithiasis noted with dense shadowing. No significant pericholecystic fluid is identified. The sonographic Percell Miller sign is reportedly negative.  Common bile duct:  Diameter: 7 mm in proximal diameter  Liver:  7 mm simple cyst noted within the right hepatic lobe. No solid intrahepatic mass identified. No intrahepatic biliary ductal dilation. Normal parenchymal echogenicity and echotexture. Portal vein is patent on color Doppler imaging with normal direction of blood flow towards the liver.  Other: None.  IMPRESSION: 1. Cholelithiasis without sonographic evidence of acute cholecystitis. 2. Mild dilation of the extrahepatic bile duct, slightly progressive since prior examination. If there is clinical concern for a distal obstructing lesion, such as choledocholithiasis, ERCP or MRCP examination is recommended for further evaluation.  Electronically Signed   By: Fidela Salisbury M.D.   On: 12/07/2022 03:15 DG Chest 2 View CLINICAL DATA:  Chest pain  EXAM: CHEST - 2 VIEW  COMPARISON:   07/24/2018  FINDINGS: The heart size and mediastinal contours are within normal limits. Both lungs are clear. The visualized skeletal structures are unremarkable.  IMPRESSION: No active cardiopulmonary disease.  Electronically Signed   By: Ulyses Jarred M.D.   On: 12/07/2022 02:08  Lab Results  Component Value Date   WBC 8.5 12/07/2022   HGB 13.9 12/07/2022   HCT 40.6 12/07/2022   MCV 92.1 12/07/2022   PLT 151 [id number]   Last metabolic panel Lab Results  Component Value Date   GLUCOSE 123 (H) 12/07/2022   NA 139 12/07/2022   K 3.3 (L) 12/07/2022   CL 107 12/07/2022   CO2 25 12/07/2022   BUN 15 12/07/2022   CREATININE 1.23 12/07/2022   GFRNONAA >60 12/07/2022   CALCIUM 8.9 12/07/2022   PHOS 2.5 07/25/2018   PROT 6.3 (L) 12/07/2022   ALBUMIN 4.0 12/07/2022   LABGLOB 2.2 10/17/2021   AGRATIO 2.1 10/17/2021   BILITOT 0.9 12/07/2022   ALKPHOS 95 12/07/2022   AST 115 (H) 12/07/2022   ALT 63 (H) 12/07/2022   ANIONGAP 7 12/07/2022    Assessment and Plan: * Choledocholithiasis Nonspecific chest pain x roughly 8 hours  Ruled out from a cardiac standpoint  MRCP positive for choledocholithiasis with very mild common bile duct dilatation. No cholecystitis  T bili 0.9 , AST 115, ALT  51 Dr. Allen Norris available for ERCP  Follow up plan w/ GI  Pain and nausea control     HLD (hyperlipidemia) Cont statin    Tobacco abuse counseling 1-2 PPD smoker  Pt reports smoking since age of 57 years old Discussed cessation- pt not interested  Start nicotine patch    COPD (chronic obstructive pulmonary disease) Stable from a resp standpoint  Cont home inhalers   Non-ST elevation (NSTEMI) myocardial infarction Noted prior hx/o CAD  + central chest pain on admission secondary to choledocholithiasis S/p full dose ASA  Trop neg, EKG WNL  Cont home regimen  Follow       Advance Care Planning:   Code Status: Full Code   Consults: Dr. Allen Norris w/ gastroenterology   Family  Communication: No family at the bedside   Severity of Illness: The appropriate patient status for this patient is OBSERVATION. Observation status is judged to be reasonable and necessary in order to provide the required intensity of service to ensure the patient's safety. The patient's presenting symptoms, physical exam findings, and initial radiographic and laboratory data in the context of their medical condition is felt to place them at decreased risk for further clinical deterioration. Furthermore, it is anticipated that the patient will be medically stable for discharge from the hospital within 2 midnights of admission.   Greater than 50% was spent in counseling and coordination of care with patient Total encounter time 80 minutes or more  Author: Deneise Lever, MD 12/07/2022 7:58 AM  For on call review [email or web address].

## 2022-12-07 NOTE — Assessment & Plan Note (Signed)
Nonspecific chest pain x roughly 8 hours  Ruled out from a cardiac standpoint  MRCP positive for choledocholithiasis with very mild common bile duct dilatation. No cholecystitis  T bili 0.9 , AST 115, ALT 63 Dr. Allen Norris available for ERCP  Follow up plan w/ GI  Pain and nausea control

## 2022-12-07 NOTE — Assessment & Plan Note (Signed)
1-2 PPD smoker  Pt reports smoking since age of 57 years old Discussed cessation- pt not interested  Start nicotine patch

## 2022-12-07 NOTE — Assessment & Plan Note (Signed)
Cont statin

## 2022-12-07 NOTE — Assessment & Plan Note (Signed)
Stable from a resp standpoint  Cont home inhalers   

## 2022-12-08 ENCOUNTER — Encounter: Payer: Self-pay | Admitting: Family Medicine

## 2022-12-08 ENCOUNTER — Encounter
Admission: EM | Disposition: A | Discharge status: Home / Self Care | Payer: Self-pay | Source: Home / Self Care | Attending: Student

## 2022-12-08 ENCOUNTER — Inpatient Hospital Stay: Payer: 59

## 2022-12-08 ENCOUNTER — Inpatient Hospital Stay: Payer: 59 | Admitting: Anesthesiology

## 2022-12-08 DIAGNOSIS — K8050 Calculus of bile duct without cholangitis or cholecystitis without obstruction: Secondary | ICD-10-CM | POA: Diagnosis not present

## 2022-12-08 HISTORY — PX: ERCP: SHX5425

## 2022-12-08 LAB — CBC
HCT: 37.8 % — ABNORMAL LOW (ref 39.0–52.0)
Hemoglobin: 13 g/dL (ref 13.0–17.0)
MCH: 31.7 pg (ref 26.0–34.0)
MCHC: 34.4 g/dL (ref 30.0–36.0)
MCV: 92.2 fL (ref 80.0–100.0)
Platelets: 105 10*3/uL — ABNORMAL LOW (ref 150–400)
RBC: 4.1 MIL/uL — ABNORMAL LOW (ref 4.22–5.81)
RDW: 12.8 % (ref 11.5–15.5)
WBC: 4 10*3/uL (ref 4.0–10.5)
nRBC: 0 % (ref 0.0–0.2)

## 2022-12-08 LAB — COMPREHENSIVE METABOLIC PANEL WITH GFR
ALT: 160 U/L — ABNORMAL HIGH (ref 0–44)
AST: 161 U/L — ABNORMAL HIGH (ref 15–41)
Albumin: 3.7 g/dL (ref 3.5–5.0)
Alkaline Phosphatase: 140 U/L — ABNORMAL HIGH (ref 38–126)
Anion gap: 7 (ref 5–15)
BUN: 13 mg/dL (ref 6–20)
CO2: 27 mmol/L (ref 22–32)
Calcium: 8.6 mg/dL — ABNORMAL LOW (ref 8.9–10.3)
Chloride: 106 mmol/L (ref 98–111)
Creatinine, Ser: 0.92 mg/dL (ref 0.61–1.24)
GFR, Estimated: >60 mL/min
Glucose, Bld: 105 mg/dL — ABNORMAL HIGH (ref 70–99)
Potassium: 3.7 mmol/L (ref 3.5–5.1)
Sodium: 140 mmol/L (ref 135–145)
Total Bilirubin: 1 mg/dL (ref 0.3–1.2)
Total Protein: 6 g/dL — ABNORMAL LOW (ref 6.5–8.1)

## 2022-12-08 LAB — LIPID PANEL
Cholesterol: 101 mg/dL (ref 0–200)
HDL: 19 mg/dL — ABNORMAL LOW (ref >40)
LDL Cholesterol: 61 mg/dL (ref 0–99)
Total CHOL/HDL Ratio: 5.3 RATIO
Triglycerides: 107 mg/dL (ref <150)
VLDL: 21 mg/dL (ref 0–40)

## 2022-12-08 SURGERY — ERCP, WITH INTERVENTION IF INDICATED
Anesthesia: General

## 2022-12-08 MED ORDER — EPHEDRINE 5 MG/ML INJ
INTRAVENOUS | Status: AC
  Filled 2022-12-08: qty 10

## 2022-12-08 MED ORDER — ONDANSETRON HCL 4 MG/2ML IJ SOLN
INTRAMUSCULAR | Status: AC
  Filled 2022-12-08: qty 4

## 2022-12-08 MED ORDER — SUCCINYLCHOLINE CHLORIDE 200 MG/10ML IV SOSY
PREFILLED_SYRINGE | INTRAVENOUS | Status: AC
  Filled 2022-12-08: qty 10

## 2022-12-08 MED ORDER — PROPOFOL 10 MG/ML IV BOLUS
INTRAVENOUS | Status: DC | PRN
  Administered 2022-12-08: 50 mg via INTRAVENOUS
  Administered 2022-12-08: 150 mg via INTRAVENOUS

## 2022-12-08 MED ORDER — DICLOFENAC SUPPOSITORY 100 MG
RECTAL | Status: AC
  Filled 2022-12-08: qty 1

## 2022-12-08 MED ORDER — LIDOCAINE HCL (CARDIAC) PF 100 MG/5ML IV SOSY
PREFILLED_SYRINGE | INTRAVENOUS | Status: DC | PRN
  Administered 2022-12-08: 80 mg via INTRAVENOUS

## 2022-12-08 MED ORDER — CHLORHEXIDINE GLUCONATE CLOTH 2 % EX PADS
6.0000 | MEDICATED_PAD | Freq: Every day | CUTANEOUS | Status: DC
  Administered 2022-12-09 – 2022-12-10 (×2): 6 via TOPICAL

## 2022-12-08 MED ORDER — CHLORHEXIDINE GLUCONATE CLOTH 2 % EX PADS: 6.0000 | MEDICATED_PAD | Freq: Every day | CUTANEOUS | Status: DC

## 2022-12-08 MED ORDER — SODIUM CHLORIDE 0.9 % IV SOLN: INTRAVENOUS | Status: DC

## 2022-12-08 MED ORDER — EPHEDRINE SULFATE (PRESSORS) 50 MG/ML IJ SOLN
INTRAMUSCULAR | Status: DC | PRN
  Administered 2022-12-08: 10 mg via INTRAVENOUS

## 2022-12-08 MED ORDER — PROPOFOL 1000 MG/100ML IV EMUL
INTRAVENOUS | Status: AC
  Filled 2022-12-08: qty 100

## 2022-12-08 MED ORDER — DICLOFENAC SUPPOSITORY 100 MG
100.0000 mg | Freq: Once | RECTAL | Status: AC
  Administered 2022-12-08: 100 mg via RECTAL

## 2022-12-08 MED ORDER — SODIUM CHLORIDE 0.9 % IV SOLN
2.0000 g | INTRAVENOUS | Status: DC
  Administered 2022-12-08 – 2022-12-09 (×2): 2 g via INTRAVENOUS
  Filled 2022-12-08: qty 2
  Filled 2022-12-08: qty 20

## 2022-12-08 MED ORDER — DEXAMETHASONE SODIUM PHOSPHATE 10 MG/ML IJ SOLN
INTRAMUSCULAR | Status: AC
  Filled 2022-12-08: qty 1

## 2022-12-08 MED ORDER — DEXAMETHASONE SODIUM PHOSPHATE 10 MG/ML IJ SOLN
INTRAMUSCULAR | Status: DC | PRN
  Administered 2022-12-08: 8 mg via INTRAVENOUS

## 2022-12-08 MED ORDER — DEXMEDETOMIDINE HCL IN NACL 200 MCG/50ML IV SOLN
INTRAVENOUS | Status: DC | PRN
  Administered 2022-12-08: 20 ug via INTRAVENOUS

## 2022-12-08 MED ORDER — INDOCYANINE GREEN 25 MG IV SOLR
2.5000 mg | Freq: Once | INTRAVENOUS | Status: AC
  Administered 2022-12-09: 2.5 mg via INTRAVENOUS
  Filled 2022-12-08: qty 1

## 2022-12-08 MED ORDER — LACTATED RINGERS IV SOLN
INTRAVENOUS | Status: DC
  Administered 2022-12-08: 1000 mL via INTRAVENOUS

## 2022-12-08 MED ORDER — GLYCOPYRROLATE 0.2 MG/ML IJ SOLN
INTRAMUSCULAR | Status: DC | PRN
  Administered 2022-12-08: .2 mg via INTRAVENOUS

## 2022-12-08 MED ORDER — GLYCOPYRROLATE 0.2 MG/ML IJ SOLN
INTRAMUSCULAR | Status: AC
  Filled 2022-12-08: qty 3

## 2022-12-08 MED ORDER — SUCCINYLCHOLINE CHLORIDE 200 MG/10ML IV SOSY
PREFILLED_SYRINGE | INTRAVENOUS | Status: DC | PRN
  Administered 2022-12-08: 100 mg via INTRAVENOUS

## 2022-12-08 MED ORDER — PROPOFOL 500 MG/50ML IV EMUL
INTRAVENOUS | Status: DC | PRN
  Administered 2022-12-08: 75 ug/kg/min via INTRAVENOUS

## 2022-12-08 NOTE — Consult Note (Signed)
Patient ID: Thomas Romero, male   DOB: [birth year], 57 y.o.   MRN: [id number]  HPI Thomas Romero is a 57 y.o. male seen in consultation at the request of Dr. Dwyane Dee.  He was admitted yesterday for atypical chest pain.  He states that his pain was actually in the subxiphoid region and epigastric region moderate to severe in nature and sharp.  The pain was nonradiated and had no specific alleviating or aggravating factors.  Does have a significant history of MI in 2019 and had a stent placed.  He takes daily aspirin.  He is able to perform more than 4 METS of activity without any shortness of breath or chest pain.  He underwent extensive cardiac workup and there was no evidence of ischemic changes by EKG or troponin.  He underwent further testing to include ultrasound as well as MRCP that I have personally reviewed showing evidence of cholelithiasis with choledocholithiasis.  He did have an ERCP by Dr.Wohl today Did have elevation of the AST and ALT and alkaline phosphatase with normal bilirubin.  CBC was completely normal. He Did have exploratory laparotomy at [facility] 24 years ago or so.   he had a bowel perforation, no records avaialable  HPI  Past Medical History:  Diagnosis Date   Coronary artery disease    Non-ST elevation myocardial infarction in November 2019.  Cardiac catheterization showed 99% stenosis in the mid RCA treated successfully with PCI and drug-eluting stent placement in moderate mid left circumflex disease.   Gastric ulcer    Hyperlipidemia    Manic depression    Panic attacks    Tobacco use     Past Surgical History:  Procedure Laterality Date   CORONARY/GRAFT ACUTE MI REVASCULARIZATION N/A 07/25/2018   Procedure: Coronary/Graft Acute MI Revascularization;  Surgeon: Wellington Hampshire, MD;  Location: [place] [facility];  Service: Cardiovascular;  Laterality: N/A;   LEFT HEART CATH AND CORONARY ANGIOGRAPHY N/A 07/25/2018   Procedure: LEFT HEART CATH AND CORONARY ANGIOGRAPHY;   Surgeon: Wellington Hampshire, MD;  Location: [place] [facility];  Service: Cardiovascular;  Laterality: N/A;   SMALL INTESTINE SURGERY      Family History  Problem Relation Age of Onset   Heart attack Mother    Stroke Father    Heart attack Sister    Heart attack Brother     Social History Social History   Tobacco Use   Smoking status: Every Day    Packs/day: 2.00    Years: 36.00    Additional pack years: 0.00    Total pack years: 72.00    Types: Cigarettes   Smokeless tobacco: Never   Tobacco comments:    Patient 5 PPD; recently cut back to 1.5 daily  Vaping Use   Vaping Use: Never used  Substance Use Topics   Alcohol use: Yes   Drug use: No    Allergies  Allergen Reactions   Ibuprofen Other (See Comments)    Gastric ulcers    Current Facility-Administered Medications  Medication Dose Route Frequency Provider Last Rate Last Admin   atorvastatin (LIPITOR) tablet 80 mg  80 mg Oral Daily Lucilla Lame, MD       Derrill Memo ON 12/09/2022] Chlorhexidine Gluconate Cloth 2 % PADS 6 each  6 each Topical Q0600 Vung Kush F, MD       [START ON 12/09/2022] Chlorhexidine Gluconate Cloth 2 % PADS 6 each  6 each Topical Q0600 Taunja Brickner, Marjory Lies, MD  dextrose 5 %-0.45 % sodium chloride infusion   Intravenous Continuous Val Riles, MD 75 mL/hr at 12/08/22 2124 New Bag at 12/08/22 2124   ezetimibe (ZETIA) tablet 10 mg  10 mg Oral Daily Lucilla Lame, MD       famotidine (PEPCID) IVPB 20 mg premix  20 mg Intravenous Once Lucilla Lame, MD       metoprolol succinate (TOPROL-XL) 24 hr tablet 50 mg  50 mg Oral Daily Lucilla Lame, MD       morphine (PF) 2 MG/ML injection 2 mg  2 mg Intravenous Q2H PRN Lucilla Lame, MD       nicotine (NICODERM CQ - dosed in mg/24 hours) patch 21 mg  21 mg Transdermal Daily Lucilla Lame, MD       ondansetron [facility]) injection 4 mg  4 mg Intravenous Q6H PRN Lucilla Lame, MD       tiotropium (SPIRIVA) inhalation capsule ([facility] use ONLY) 18 mcg  18 mcg Inhalation  Daily Lucilla Lame, MD       traZODone (DESYREL) tablet 50 mg  50 mg Oral QHS PRN Lucilla Lame, MD         Review of Systems Full ROS  was asked and was negative except for the information on the HPI  Physical Exam Blood pressure 138/71, pulse 63, temperature 97.7 F (36.5 C), resp. rate 18, height 5\' 10"  (1.778 m), weight 73 kg, SpO2 97 %. CONSTITUTIONAL: NAD. EYES: Pupils are equal, round, and reactive to light, Sclera are non-icteric. EARS, NOSE, MOUTH AND THROAT: The oropharynx is clear. The oral mucosa is pink and moist. Hearing is intact to voice. LYMPH NODES:  Lymph nodes in the neck are normal. RESPIRATORY:  Lungs are clear. There is normal respiratory effort, with equal breath sounds bilaterally, and without pathologic use of accessory muscles. CARDIOVASCULAR: Heart is regular without murmurs, gallops, or rubs. GI: The abdomen is  soft, nontender, and nondistended. There are no palpable masses. There is no hepatosplenomegaly. There are normal bowel sounds GU: Rectal deferred.   MUSCULOSKELETAL: Normal muscle strength and tone. No cyanosis or edema.   SKIN: Turgor is good and there are no pathologic skin lesions or ulcers. NEUROLOGIC: Motor and sensation is grossly normal. Cranial nerves are grossly intact. PSYCH:  Oriented to person, place and time. Affect is normal.  Data Reviewed  I have personally reviewed the patient's imaging, laboratory findings and medical records.    Assessment/Plan 57 year old male with biliary colic associated with choledocholithiasis status post ERCP.  There is no evidence of complications related to ERCP.  Discussed with the patient in detail and I do recommend cholecystectomy during this hospitalization.  I Do think that he is a good candidate for robotic approach The risks, benefits, complications, treatment options, and expected outcomes were discussed with the patient. The possibilities of bleeding, recurrent infection, finding a normal  gallbladder, perforation of viscus organs, damage to surrounding structures, bile leak, abscess formation, needing a drain placed, the need for additional procedures, reaction to medication, pulmonary aspiration,  failure to diagnose a condition, the possible need to convert to an open procedure, and creating a complication requiring transfusion or operation were discussed with the patient. The patient and/or family concurred with the proposed plan, giving informed consent. Will plan to do it tomorrow pending or availability.    Caroleen Hamman, MD FACS General Surgeon 12/08/2022, 11:02 PM

## 2022-12-08 NOTE — Anesthesia Preprocedure Evaluation (Signed)
Anesthesia Evaluation  Patient identified by MRN, date of birth, ID band Patient awake    Reviewed: Allergy & Precautions, NPO status , Patient's Chart, lab work & pertinent test results  History of Anesthesia Complications Negative for: history of anesthetic complications  Airway Mallampati: III  TM Distance: >3 FB Neck ROM: full    Dental  (+) Poor Dentition   Pulmonary COPD,  COPD inhaler, Current Smoker and Patient abstained from smoking.   Pulmonary exam normal        Cardiovascular + CAD, + Past MI and + Cardiac Stents  Normal cardiovascular exam     Neuro/Psych  PSYCHIATRIC DISORDERS Anxiety Depression Bipolar Disorder   negative neurological ROS     GI/Hepatic Neg liver ROS, PUD,,,  Endo/Other  negative endocrine ROS    Renal/GU negative Renal ROS  negative genitourinary   Musculoskeletal   Abdominal   Peds  Hematology negative hematology ROS (+)   Anesthesia Other Findings Past Medical History: No date: Coronary artery disease     Comment:  Non-ST elevation myocardial infarction in November 2019.              Cardiac catheterization showed 99% stenosis in the mid               RCA treated successfully with PCI and drug-eluting stent               placement in moderate mid left circumflex disease. No date: Gastric ulcer No date: Hyperlipidemia No date: Manic depression No date: Panic attacks No date: Tobacco use  Past Surgical History: 07/25/2018: CORONARY/GRAFT ACUTE MI REVASCULARIZATION; N/A     Comment:  Procedure: Coronary/Graft Acute MI Revascularization;                Surgeon: Wellington Hampshire, MD;  Location: [place]               CV LAB;  Service: Cardiovascular;  Laterality: N/A; 07/25/2018: LEFT HEART CATH AND CORONARY ANGIOGRAPHY; N/A     Comment:  Procedure: LEFT HEART CATH AND CORONARY ANGIOGRAPHY;                Surgeon: Wellington Hampshire, MD;  Location: [place]                CV LAB;  Service: Cardiovascular;  Laterality: N/A; No date: SMALL INTESTINE SURGERY  BMI    Body Mass Index: 23.10 kg/m      Reproductive/Obstetrics negative OB ROS                             Anesthesia Physical Anesthesia Plan  ASA: 3  Anesthesia Plan: General   Post-op Pain Management: Minimal or no pain anticipated   Induction: Intravenous  PONV Risk Score and Plan: Propofol infusion and TIVA  Airway Management Planned: Natural Airway and Nasal Cannula  Additional Equipment:   Intra-op Plan:   Post-operative Plan:   Informed Consent: I have reviewed the patients History and Physical, chart, labs and discussed the procedure including the risks, benefits and alternatives for the proposed anesthesia with the patient or authorized representative who has indicated his/her understanding and acceptance.     Dental Advisory Given  Plan Discussed with: Anesthesiologist, CRNA and Surgeon  Anesthesia Plan Comments: (Patient consented for risks of anesthesia including but not limited to:  - adverse reactions to medications - risk of airway placement if required - damage to eyes, teeth,  lips or other oral mucosa - nerve damage due to positioning  - sore throat or hoarseness - Damage to heart, brain, nerves, lungs, other parts of body or loss of life  Patient voiced understanding.)        Anesthesia Quick Evaluation

## 2022-12-08 NOTE — Progress Notes (Signed)
[facility] Progress Note  Patient: Thomas Romero    [id number]  DOA: 12/07/2022     Date of Service: the patient was seen and examined on 12/08/2022  Chief Complaint  Patient presents with   Chest Pain   Brief hospital course: Thomas Romero is a 57 y.o. male with PMH of CAD, HLD, tobacco abuse, depression, likely obstructive lung disease presenting with chest pain and choledocholithiasis.  Patient reports onset of central chest pain around midnight tonight.  Symptoms persisted despite treatment with as needed nitroglycerin and aspirin..  No reported radiation of the neck or down the left arm.  No associated diaphoresis.  Mild nausea.  No fevers or chills.  Minimal to mild abdominal pain.  No reported diarrhea.  Symptoms persisted despite treatment.  Still smoking 1 to 2 packs/day.  Patient reports smoking since he was 57 years old. Presented to the ER afebrile, hemodynamically stable.  Troponin negative x 2.  K3.3.  AST 115, ALT 63.  T. bili 0.9.  EKG normal sinus rhythm.  Chest x-ray stable.  Abdominal ultrasound showed cholelithiasis as well as mild dilatation of extrahepatic bile duct concerning for choledocholithiasis.   MRCP: positive for choledocholithiasis with very mild common bile duct dilatation and cholelithiasis without evidence of acute cholecystitis.   Per report, Dr. Allen Norris available for ERCP in am on 4/2  Assessment and Plan:   # Choledocholithiasis and cholelithiasis ACS ruled out, troponin negative MRCP reviewed GI consulted s/p ERCP, tolerated procedure well Continue symptomatic treatment GI recommended to consult general surgery for possible lap chole Follow general surgery for further recommendation   # COPD stable Continue Spiriva  # Hypertension and hyperlipidemia Continued home meds aspirin, statin, Zetia, Toprol-XL Monitor BP and titrate medication accordingly  # Nicotine dependence, patient smokes cigarettes 1-2 pack/day, smoking cessation counseling  done   Body mass index is 23.1 kg/m.  Interventions:       Diet: CLD DVT Prophylaxis: SCD, pharmacological prophylaxis contraindicated due to risk of bleeding post ERCP    Advance goals of care discussion: Full code  Family Communication: family was present at bedside, at the time of interview.  The pt provided permission to discuss medical plan with the family. Opportunity was given to ask question and all questions were answered satisfactorily.   Disposition:  Pt is from Home, admitted with CBD and GD stone, s/p ERCP and awaiting for general surgery, may need lap chole, which precludes a safe discharge. Discharge to home, when cleared by surgery.  Subjective: No significant events overnight, patient was seen after ERCP, tolerated procedure well, no complications, denies any abdominal pain or chest pain.  No nausea vomiting.  Physical Exam: General: NAD, lying comfortably Appear in no distress, affect appropriate Eyes: PERRLA ENT: Oral Mucosa Clear, moist  Neck: no JVD,  Cardiovascular: S1 and S2 Present, no Murmur,  Respiratory: good respiratory effort, Bilateral Air entry equal and Decreased, no Crackles, no wheezes Abdomen: Bowel Sound present, Soft and no tenderness,  Skin: no rashes Extremities: no Pedal edema, no calf tenderness Neurologic: without any new focal findings Gait not checked due to patient safety concerns  Vitals:   12/08/22 1203 12/08/22 1209 12/08/22 1223 12/08/22 1244  BP: (!) 146/74 (!) 140/78  138/71  Pulse: 75   63  Resp: (!) 22 20  18   Temp:    97.7 F (36.5 C)  TempSrc:      SpO2: 94% 97% 97% 97%  Weight:      Height:  Intake/Output Summary (Last 24 hours) at 12/08/2022 1443 Last data filed at 12/08/2022 1200 Gross per 24 hour  Intake 800 ml  Output 1300 ml  Net -500 ml   Filed Weights   12/08/22 1018  Weight: 73 kg    Data Reviewed: I have personally reviewed and interpreted daily labs, tele strips, imagings as discussed  above. I reviewed all nursing notes, pharmacy notes, vitals, pertinent old records I have discussed plan of care as described above with RN and patient/family.  CBC: Recent Labs  Lab 12/07/22 0114 12/08/22 0416  WBC 8.5 4.0  HGB 13.9 13.0  HCT 40.6 37.8*  MCV 92.1 92.2  PLT 151 123456*   Basic Metabolic Panel: Recent Labs  Lab 12/07/22 0120 12/08/22 0416  NA 139 140  K 3.3* 3.7  CL 107 106  CO2 25 27  GLUCOSE 123* 105*  BUN 15 13  CREATININE 1.23 0.92  CALCIUM 8.9 8.6*    Studies: DG C-Arm 1-60 Min-No Report  Result Date: 12/08/2022 Fluoroscopy was utilized by the requesting physician.  No radiographic interpretation.    Scheduled Meds:  aspirin  81 mg Oral Daily   atorvastatin  80 mg Oral Daily   diclofenac       ezetimibe  10 mg Oral Daily   metoprolol succinate  50 mg Oral Daily   nicotine  21 mg Transdermal Daily   tiotropium  18 mcg Inhalation Daily   Continuous Infusions:  dextrose 5 % and 0.45% NaCl Stopped (12/08/22 1028)   famotidine (PEPCID) IV     PRN Meds: diclofenac, morphine injection, [DISCONTINUED] ondansetron **OR** ondansetron (ZOFRAN) IV, traZODone  Time spent: 35 minutes  Author: Val Riles. MD [facility] 12/08/2022 2:43 PM  To reach On-call, see care teams to locate the attending and reach out to them via [email or web address]. If 7PM-7AM, please contact night-coverage If you still have difficulty reaching the attending provider, please page the [facility] (Director on Call) for [facility] on amion for assistance.

## 2022-12-08 NOTE — Anesthesia Procedure Notes (Addendum)
Procedure Name: Intubation Date/Time: 12/08/2022 11:09 AM  Performed by: Letitia Neri, CRNAPre-anesthesia Checklist: Patient identified, Patient being monitored, Timeout performed, Emergency Drugs available and Suction available Patient Re-evaluated:Patient Re-evaluated prior to induction Oxygen Delivery Method: Circle system utilized Preoxygenation: Pre-oxygenation with 100% oxygen Induction Type: IV induction and Rapid sequence Laryngoscope Size: 3 and McGraph Grade View: Grade I Tube type: Oral Tube size: 7.0 mm Number of attempts: 1 Airway Equipment and Method: Stylet Placement Confirmation: ETT inserted through vocal cords under direct vision, positive ETCO2 and breath sounds checked- equal and bilateral Secured at: 21 cm Tube secured with: Tape Dental Injury: Teeth and Oropharynx as per pre-operative assessment

## 2022-12-08 NOTE — Op Note (Signed)
[facility]enter[facility] Patient Name: Thomas Romero Procedure Date: 12/08/2022 10:39 AM MRN: [id number] Account #: [id number] Date of Birth: 24-Nov-1965 Admit Type: Outpatient Age: 57 Room: [facility] ENDO ROOM 4 Gender: Male Note Status: Finalized Instrument Name: [id number] Procedure:             ERCP Indications:           Common bile duct stone(s) Providers:             Lucilla Lame MD, MD Medicines:             General Anesthesia Complications:         No immediate complications. Procedure:             Pre-Anesthesia Assessment:                        - Prior to the procedure, a History and Physical was                         performed, and patient medications and allergies were                         reviewed. The patient's tolerance of previous                         anesthesia was also reviewed. The risks and benefits                         of the procedure and the sedation options and risks                         were discussed with the patient. All questions were                         answered, and informed consent was obtained. Prior                         Anticoagulants: The patient has taken no anticoagulant                         or antiplatelet agents. ASA Grade Assessment: II - A                         patient with mild systemic disease. After reviewing                         the risks and benefits, the patient was deemed in                         satisfactory condition to undergo the procedure.                        After obtaining informed consent, the scope was passed                         under direct vision. Throughout the procedure, the                         patient's blood pressure, pulse,  and oxygen                         saturations were monitored continuously. The                         Duodenoscope was introduced through the mouth, and                         used to inject contrast into and used to inject                          contrast into the bile duct. The ERCP was accomplished                         without difficulty. The patient tolerated the                         procedure well. Findings:      The scout film was normal. The esophagus was successfully intubated       under direct vision. The scope was advanced to a normal major papilla in       the descending duodenum without detailed examination of the pharynx,       larynx and associated structures, and upper GI tract. The upper GI tract       was grossly normal. The bile duct was deeply cannulated with the       short-nosed traction sphincterotome. Contrast was injected. I personally       interpreted the bile duct images. There was brisk flow of contrast       through the ducts. Image quality was excellent. Contrast extended to the       entire biliary tree. The lower third of the main bile duct contained one       stone. A wire was passed into the biliary tree. A 6 mm biliary       sphincterotomy was made with a traction (standard) sphincterotome using       ERBE electrocautery. There was no post-sphincterotomy bleeding. The       biliary tree was swept with a 15 mm balloon starting at the bifurcation.       One stone was removed. No stones remained. Impression:            - Choledocholithiasis was found. Complete removal was                         accomplished by biliary sphincterotomy and balloon                         extraction.                        - A biliary sphincterotomy was performed.                        - The biliary tree was swept. Recommendation:        - Return patient to hospital ward for ongoing care.                        - Clear liquid diet today. Procedure Code(s):     ---  Professional ---                        [phone], Endoscopic retrograde cholangiopancreatography                         (ERCP); with removal of calculi/debris from                         biliary/pancreatic duct(s)                         [id number], Endoscopic retrograde cholangiopancreatography                         (ERCP); with sphincterotomy/papillotomy                        [phone], Endoscopic catheterization of the biliary                         ductal system, radiological supervision and                         interpretation Diagnosis Code(s):     --- Professional ---                        K80.50, Calculus of bile duct without cholangitis or                         cholecystitis without obstruction CPT copyright 2022 American Medical Association. All rights reserved. The codes documented in this report are preliminary and upon coder review may  be revised to meet current compliance requirements. Lucilla Lame MD, MD 12/08/2022 11:47:18 AM This report has been signed electronically. Number of Addenda: 0 Note Initiated On: 12/08/2022 10:39 AM Estimated Blood Loss:  Estimated blood loss: none.      [facility]

## 2022-12-08 NOTE — Anesthesia Postprocedure Evaluation (Signed)
Anesthesia Post Note  Patient: Thomas Romero  Procedure(s) Performed: ENDOSCOPIC RETROGRADE CHOLANGIOPANCREATOGRAPHY (ERCP)  Patient location during evaluation: Endoscopy Anesthesia Type: General Level of consciousness: awake and alert Pain management: pain level controlled Vital Signs Assessment: post-procedure vital signs reviewed and stable Respiratory status: spontaneous breathing, nonlabored ventilation, respiratory function stable and patient connected to nasal cannula oxygen Cardiovascular status: blood pressure returned to baseline and stable Postop Assessment: no apparent nausea or vomiting Anesthetic complications: no   No notable events documented.   Last Vitals:  Vitals:   12/08/22 1203 12/08/22 1209  BP: (!) 146/74   Pulse: 75   Resp: (!) 22   Temp:    SpO2: 94% 97%    Last Pain:  Vitals:   12/08/22 1203  TempSrc:   PainSc: 0-No pain                 Ilene Qua

## 2022-12-08 NOTE — Transfer of Care (Signed)
Immediate Anesthesia Transfer of Care Note  Patient: DIMARCO HOLSTAD  Procedure(s) Performed: ENDOSCOPIC RETROGRADE CHOLANGIOPANCREATOGRAPHY (ERCP)  Patient Location: PACU  Anesthesia Type:General  Level of Consciousness: sedated  Airway & Oxygen Therapy: Patient Spontanous Breathing  Post-op Assessment: Report given to RN and Post -op Vital signs reviewed and stable  Post vital signs: Reviewed   Last Vitals:  Vitals Value Taken Time  BP 147/81 12/08/22 1153  Temp 35.6 C 12/08/22 1153  Pulse 92 12/08/22 1153  Resp 29 12/08/22 1153  SpO2 94 % 12/08/22 1153    Last Pain:  Vitals:   12/08/22 1153  TempSrc: Temporal  PainSc: Asleep         Complications: No notable events documented.

## 2022-12-09 ENCOUNTER — Encounter: Payer: Self-pay | Admitting: Gastroenterology

## 2022-12-09 ENCOUNTER — Inpatient Hospital Stay: Payer: 59 | Admitting: Certified Registered"

## 2022-12-09 ENCOUNTER — Encounter
Admission: EM | Disposition: A | Discharge status: Home / Self Care | Payer: Self-pay | Source: Home / Self Care | Attending: Student

## 2022-12-09 DIAGNOSIS — K8050 Calculus of bile duct without cholangitis or cholecystitis without obstruction: Secondary | ICD-10-CM | POA: Diagnosis not present

## 2022-12-09 DIAGNOSIS — K8021 Calculus of gallbladder without cholecystitis with obstruction: Secondary | ICD-10-CM

## 2022-12-09 DIAGNOSIS — K8064 Calculus of gallbladder and bile duct with chronic cholecystitis without obstruction: Secondary | ICD-10-CM

## 2022-12-09 HISTORY — PX: LYSIS OF ADHESION: SHX5961

## 2022-12-09 LAB — HEPATIC FUNCTION PANEL
ALT: 149 U/L — ABNORMAL HIGH (ref 0–44)
AST: 85 U/L — ABNORMAL HIGH (ref 15–41)
Albumin: 3.8 g/dL (ref 3.5–5.0)
Alkaline Phosphatase: 158 U/L — ABNORMAL HIGH (ref 38–126)
Bilirubin, Direct: 0.1 mg/dL (ref 0.0–0.2)
Indirect Bilirubin: 0.5 mg/dL (ref 0.3–0.9)
Total Bilirubin: 0.6 mg/dL (ref 0.3–1.2)
Total Protein: 6.8 g/dL (ref 6.5–8.1)

## 2022-12-09 LAB — BASIC METABOLIC PANEL
Anion gap: 7 (ref 5–15)
BUN: 9 mg/dL (ref 6–20)
CO2: 24 mmol/L (ref 22–32)
Calcium: 9.1 mg/dL (ref 8.9–10.3)
Chloride: 108 mmol/L (ref 98–111)
Creatinine, Ser: 0.94 mg/dL (ref 0.61–1.24)
GFR, Estimated: >60 mL/min (ref >60)
Glucose, Bld: 145 mg/dL — ABNORMAL HIGH (ref 70–99)
Potassium: 4.4 mmol/L (ref 3.5–5.1)
Sodium: 139 mmol/L (ref 135–145)

## 2022-12-09 LAB — CBC
HCT: 40.2 % (ref 39.0–52.0)
Hemoglobin: 13.7 g/dL (ref 13.0–17.0)
MCH: 31.4 pg (ref 26.0–34.0)
MCHC: 34.1 g/dL (ref 30.0–36.0)
MCV: 92 fL (ref 80.0–100.0)
Platelets: 143 10*3/uL — ABNORMAL LOW (ref 150–400)
RBC: 4.37 MIL/uL (ref 4.22–5.81)
RDW: 12.6 % (ref 11.5–15.5)
WBC: 13.1 10*3/uL — ABNORMAL HIGH (ref 4.0–10.5)
nRBC: 0 % (ref 0.0–0.2)

## 2022-12-09 LAB — MAGNESIUM: Magnesium: 1.9 mg/dL (ref 1.7–2.4)

## 2022-12-09 LAB — PHOSPHORUS: Phosphorus: 2.6 mg/dL (ref 2.5–4.6)

## 2022-12-09 LAB — LIPASE, BLOOD: Lipase: 26 U/L (ref 11–51)

## 2022-12-09 SURGERY — CHOLECYSTECTOMY, ROBOT-ASSISTED, LAPAROSCOPIC
Anesthesia: General | Site: Abdomen

## 2022-12-09 MED ORDER — BUPIVACAINE LIPOSOME 1.3 % IJ SUSP
INTRAMUSCULAR | Status: AC
  Filled 2022-12-09: qty 20

## 2022-12-09 MED ORDER — LIDOCAINE HCL (CARDIAC) PF 100 MG/5ML IV SOSY
PREFILLED_SYRINGE | INTRAVENOUS | Status: DC | PRN
  Administered 2022-12-09: 100 mg via INTRAVENOUS

## 2022-12-09 MED ORDER — PROPOFOL 10 MG/ML IV BOLUS
INTRAVENOUS | Status: DC | PRN
  Administered 2022-12-09: 80 mg via INTRAVENOUS
  Administered 2022-12-09: 30 mg via INTRAVENOUS

## 2022-12-09 MED ORDER — METOPROLOL TARTRATE 5 MG/5ML IV SOLN
INTRAVENOUS | Status: AC
  Filled 2022-12-09: qty 5

## 2022-12-09 MED ORDER — HYDROMORPHONE HCL 1 MG/ML IJ SOLN
INTRAMUSCULAR | Status: DC | PRN
  Administered 2022-12-09: 1 mg via INTRAVENOUS

## 2022-12-09 MED ORDER — EPINEPHRINE PF 1 MG/ML IJ SOLN
INTRAMUSCULAR | Status: AC
  Filled 2022-12-09: qty 1

## 2022-12-09 MED ORDER — ACETAMINOPHEN 10 MG/ML IV SOLN: 1000.0000 mg | Freq: Once | INTRAVENOUS | Status: DC | PRN

## 2022-12-09 MED ORDER — ROCURONIUM BROMIDE 100 MG/10ML IV SOLN
INTRAVENOUS | Status: DC | PRN
  Administered 2022-12-09: 20 mg via INTRAVENOUS
  Administered 2022-12-09: 50 mg via INTRAVENOUS

## 2022-12-09 MED ORDER — KETAMINE HCL 10 MG/ML IJ SOLN
INTRAMUSCULAR | Status: DC | PRN
  Administered 2022-12-09: 20 mg via INTRAVENOUS
  Administered 2022-12-09: 30 mg via INTRAVENOUS

## 2022-12-09 MED ORDER — DEXMEDETOMIDINE HCL IN NACL 80 MCG/20ML IV SOLN
INTRAVENOUS | Status: DC | PRN
  Administered 2022-12-09 (×2): 8 ug via BUCCAL
  Administered 2022-12-09: 4 ug via BUCCAL

## 2022-12-09 MED ORDER — DEXAMETHASONE SODIUM PHOSPHATE 10 MG/ML IJ SOLN
INTRAMUSCULAR | Status: DC | PRN
  Administered 2022-12-09: 10 mg via INTRAVENOUS

## 2022-12-09 MED ORDER — OXYCODONE HCL 5 MG PO TABS: 5.0000 mg | ORAL_TABLET | Freq: Once | ORAL | Status: DC | PRN

## 2022-12-09 MED ORDER — METOPROLOL TARTRATE 5 MG/5ML IV SOLN
INTRAVENOUS | Status: DC | PRN
  Administered 2022-12-09: 1 mg via INTRAVENOUS
  Administered 2022-12-09: 2 mg via INTRAVENOUS

## 2022-12-09 MED ORDER — PROMETHAZINE HCL 25 MG/ML IJ SOLN: 6.2500 mg | INTRAMUSCULAR | Status: DC | PRN

## 2022-12-09 MED ORDER — HYDROMORPHONE HCL 1 MG/ML IJ SOLN
INTRAMUSCULAR | Status: AC
  Filled 2022-12-09: qty 1

## 2022-12-09 MED ORDER — BUPIVACAINE LIPOSOME 1.3 % IJ SUSP
INTRAMUSCULAR | Status: DC | PRN
  Administered 2022-12-09: 20 mL

## 2022-12-09 MED ORDER — FENTANYL CITRATE (PF) 100 MCG/2ML IJ SOLN
INTRAMUSCULAR | Status: AC
  Filled 2022-12-09: qty 2

## 2022-12-09 MED ORDER — MIDAZOLAM HCL 2 MG/2ML IJ SOLN
INTRAMUSCULAR | Status: AC
  Filled 2022-12-09: qty 2

## 2022-12-09 MED ORDER — LACTATED RINGERS IV SOLN: INTRAVENOUS | Status: DC | PRN

## 2022-12-09 MED ORDER — BUPIVACAINE-EPINEPHRINE (PF) 0.25% -1:200000 IJ SOLN
INTRAMUSCULAR | Status: DC | PRN
  Administered 2022-12-09: 30 mL

## 2022-12-09 MED ORDER — BUPIVACAINE HCL (PF) 0.25 % IJ SOLN
INTRAMUSCULAR | Status: AC
  Filled 2022-12-09: qty 30

## 2022-12-09 MED ORDER — FENTANYL CITRATE (PF) 100 MCG/2ML IJ SOLN: 25.0000 ug | INTRAMUSCULAR | Status: DC | PRN

## 2022-12-09 MED ORDER — CEFAZOLIN SODIUM-DEXTROSE 2-3 GM-%(50ML) IV SOLR
INTRAVENOUS | Status: DC | PRN
  Administered 2022-12-09: 2 g via INTRAVENOUS

## 2022-12-09 MED ORDER — DROPERIDOL 2.5 MG/ML IJ SOLN: 0.6250 mg | Freq: Once | INTRAMUSCULAR | Status: DC | PRN

## 2022-12-09 MED ORDER — ACETAMINOPHEN 500 MG PO TABS
1000.0000 mg | ORAL_TABLET | Freq: Four times a day (QID) | ORAL | Status: DC
  Administered 2022-12-09 – 2022-12-10 (×4): 1000 mg via ORAL
  Filled 2022-12-09 (×4): qty 2

## 2022-12-09 MED ORDER — SEVOFLURANE IN SOLN
RESPIRATORY_TRACT | Status: AC
  Filled 2022-12-09: qty 250

## 2022-12-09 MED ORDER — LABETALOL HCL 5 MG/ML IV SOLN
INTRAVENOUS | Status: AC
  Filled 2022-12-09: qty 4

## 2022-12-09 MED ORDER — OXYCODONE HCL 5 MG PO TABS
5.0000 mg | ORAL_TABLET | ORAL | Status: DC | PRN
  Administered 2022-12-09 (×3): 5 mg via ORAL
  Filled 2022-12-09 (×3): qty 1

## 2022-12-09 MED ORDER — FENTANYL CITRATE (PF) 100 MCG/2ML IJ SOLN
INTRAMUSCULAR | Status: DC | PRN
  Administered 2022-12-09 (×2): 50 ug via INTRAVENOUS

## 2022-12-09 MED ORDER — MIDAZOLAM HCL 2 MG/2ML IJ SOLN
INTRAMUSCULAR | Status: DC | PRN
  Administered 2022-12-09: 2 mg via INTRAVENOUS

## 2022-12-09 MED ORDER — GLYCOPYRROLATE 0.2 MG/ML IJ SOLN
INTRAMUSCULAR | Status: DC | PRN
  Administered 2022-12-09: .2 mg via INTRAVENOUS

## 2022-12-09 MED ORDER — KETAMINE HCL 50 MG/5ML IJ SOSY
PREFILLED_SYRINGE | INTRAMUSCULAR | Status: AC
  Filled 2022-12-09: qty 5

## 2022-12-09 MED ORDER — SUGAMMADEX SODIUM 200 MG/2ML IV SOLN
INTRAVENOUS | Status: DC | PRN
  Administered 2022-12-09: 200 mg via INTRAVENOUS

## 2022-12-09 MED ORDER — OXYCODONE HCL 5 MG/5ML PO SOLN: 5.0000 mg | Freq: Once | ORAL | Status: DC | PRN

## 2022-12-09 SURGICAL SUPPLY — 47 items
ADH SKN CLS APL DERMABOND .7 (GAUZE/BANDAGES/DRESSINGS) ×1
BLADE CLIPPER SURG (BLADE) IMPLANT
CANNULA REDUC XI 12-8 STAPL (CANNULA) ×1
CANNULA REDUCER 12-8 DVNC XI (CANNULA) ×1 IMPLANT
CATH REDDICK CHOLANGI 4FR 50CM (CATHETERS) IMPLANT
CLIP LIGATING HEMO O LOK GREEN (MISCELLANEOUS) ×1 IMPLANT
DERMABOND ADVANCED .7 DNX12 (GAUZE/BANDAGES/DRESSINGS) ×1 IMPLANT
DRAPE ARM DVNC X/XI (DISPOSABLE) ×4 IMPLANT
DRAPE COLUMN DVNC XI (DISPOSABLE) ×1 IMPLANT
DRAPE DA VINCI XI ARM (DISPOSABLE) ×4
DRAPE DA VINCI XI COLUMN (DISPOSABLE) ×1
ELECT CAUTERY BLADE 6.4 (BLADE) ×1 IMPLANT
ELECT REM PT RETURN 9FT ADLT (ELECTROSURGICAL) ×1
ELECTRODE REM PT RTRN 9FT ADLT (ELECTROSURGICAL) ×1 IMPLANT
GLOVE BIO SURGEON STRL SZ7 (GLOVE) ×2 IMPLANT
GOWN STRL REUS W/ TWL LRG LVL3 (GOWN DISPOSABLE) ×4 IMPLANT
GOWN STRL REUS W/TWL LRG LVL3 (GOWN DISPOSABLE) ×3
IRRIGATION STRYKERFLOW (MISCELLANEOUS) IMPLANT
IRRIGATOR STRYKERFLOW (MISCELLANEOUS)
IV CATH ANGIO 12GX3 LT BLUE (NEEDLE) IMPLANT
KIT PINK PAD W/HEAD ARE REST (MISCELLANEOUS) ×1 IMPLANT
KIT PINK PAD W/HEAD ARM REST (MISCELLANEOUS) ×1 IMPLANT
LABEL OR SOLS (LABEL) ×1 IMPLANT
MANIFOLD NEPTUNE II (INSTRUMENTS) ×1 IMPLANT
NDL HYPO 22X1.5 SAFETY MO (MISCELLANEOUS) ×1 IMPLANT
NEEDLE HYPO 22X1.5 SAFETY MO (MISCELLANEOUS) ×1 IMPLANT
NS IRRIG 500ML POUR BTL (IV SOLUTION) ×1 IMPLANT
OBTURATOR OPTICAL STANDARD 8MM (TROCAR) ×1
OBTURATOR OPTICAL STND 8 DVNC (TROCAR) ×1
OBTURATOR OPTICALSTD 8 DVNC (TROCAR) ×1 IMPLANT
PACK LAP CHOLECYSTECTOMY (MISCELLANEOUS) ×1 IMPLANT
PENCIL SMOKE EVACUATOR (MISCELLANEOUS) ×1 IMPLANT
SEAL UNIV 5-12 XI (MISCELLANEOUS) ×3 IMPLANT
SEAL XI UNIVERSAL 5-12 (MISCELLANEOUS) ×4
SET TUBE SMOKE EVAC HIGH FLOW (TUBING) ×1 IMPLANT
SOL ELECTROSURG ANTI STICK (MISCELLANEOUS) ×1
SOLUTION ELECTROSURG ANTI STCK (MISCELLANEOUS) ×1 IMPLANT
SPONGE T-LAP 18X18 ~~LOC~~+RFID (SPONGE) ×1 IMPLANT
SPONGE T-LAP 4X18 ~~LOC~~+RFID (SPONGE) IMPLANT
STOPCOCK 3 WAY MALE LL (IV SETS)
STOPCOCK 3WAY MALE LL (IV SETS) IMPLANT
SUT MNCRL AB 4-0 PS2 18 (SUTURE) ×1 IMPLANT
SUT VICRYL 0 UR6 27IN ABS (SUTURE) ×2 IMPLANT
SYR 30ML LL (SYRINGE) ×1 IMPLANT
SYS BAG RETRIEVAL 10MM (BASKET) ×1
SYSTEM BAG RETRIEVAL 10MM (BASKET) ×1 IMPLANT
WATER STERILE IRR 3000ML UROMA (IV SOLUTION) IMPLANT

## 2022-12-09 NOTE — Op Note (Signed)
Robotic assisted laparoscopic Cholecystectomy with extensive lysis of adhesions  Pre-operative Diagnosis: biliary colic choledocholithiasis  Post-operative Diagnosis: same  Surgeon: Caroleen Hamman, MD FACS  Anesthesia: Gen. with endotracheal tube  Findings: Chronic Cholecystitis , area of extra tissue vs ulceration at fundus difficult to exclude neoplastic process vs chronic inflammation Extensive adhesion from prior laparotomy, omentum plastered to the abdominal wall  Estimated Blood Loss:5 cc       Specimens: Gallbladder           Complications: none   Procedure Details  The patient was seen again in the Holding Room. The benefits, complications, treatment options, and expected outcomes were discussed with the patient. The risks of bleeding, infection, recurrence of symptoms, failure to resolve symptoms, bile duct damage, bile duct leak, retained common bile duct stone, bowel injury, any of which could require further surgery and/or ERCP, stent, or papillotomy were reviewed with the patient. The likelihood of improving the patient's symptoms with return to their baseline status is good.  The patient and/or family concurred with the proposed plan, giving informed consent.  The patient was taken to Operating Room, identified  and the procedure verified as Laparoscopic Cholecystectomy.  A Time Out was held and the above information confirmed.  Prior to the induction of general anesthesia, antibiotic prophylaxis was administered. VTE prophylaxis was in place. General endotracheal anesthesia was then administered and tolerated well. After the induction, the abdomen was prepped with Chloraprep and draped in the sterile fashion. The patient was positioned in the supine position.  Infraumbilical incision created away from prior laparotomy scar. Cut down technique was used to enter the abdominal cavity and a Hasson trochar was placed after two vicryl stitches were anchored to the fascia.  Pneumoperitoneum was then created with CO2 and tolerated well without any adverse changes in the patient's vital signs.  Three 8-mm ports were placed under direct vision. All skin incisions  were infiltrated with a local anesthetic agent before making the incision and placing the trocars.   The patient was positioned  in reverse Trendelenburg, robot was brought to the surgical field and docked in the standard fashion.  We made sure all the instrumentation was kept indirect view at all times and that there were no collision between the arms. I scrubbed out and went to the console. Extensive adhesions visualized, meticualous LOA with cautery and sharply was performed. Please note that LOA took most of the operative time and was about 40 minutes. We did not have any bowel injuries.  The gallbladder was identified, the fundus grasped and retracted cephalad. Adhesions were lysed bluntly. The infundibulum was grasped and retracted laterally, exposing the peritoneum overlying the triangle of Calot. This was then divided and exposed in a blunt fashion. An extended critical view of the cystic duct and cystic artery was obtained.  The cystic duct was clearly identified and bluntly dissected.   Artery and duct were double clipped and divided. Using ICG cholangiography we visualize the cystic duct and CBD w/o evidence of bile injuries. The gallbladder was taken from the gallbladder fossa in a retrograde fashion with the electrocautery.  Hemostasis was achieved with the electrocautery. nspection of the right upper quadrant was performed. No bleeding, bile duct injury or leak, or bowel injury was noted. Robotic instruments and robotic arms were undocked in the standard fashion.  I scrubbed back in.  The gallbladder was removed and placed in an Endocatch bag.   Pneumoperitoneum was released.  The periumbilical port site was closed with  interrumpted 0 Vicryl sutures. 4-0 subcuticular Monocryl was used to close the  skin. Dermabond was  applied.  The patient was then extubated and brought to the recovery room in stable condition. Sponge, lap, and needle counts were correct at closure and at the conclusion of the case.               Caroleen Hamman, MD, FACS

## 2022-12-09 NOTE — Anesthesia Postprocedure Evaluation (Signed)
Anesthesia Post Note  Patient: Thomas Romero  Procedure(s) Performed: XI ROBOTIC ASSISTED LAPAROSCOPIC CHOLECYSTECTOMY (Abdomen) INDOCYANINE GREEN FLUORESCENCE IMAGING (ICG) (Abdomen) LYSIS OF ADHESION (Abdomen)  Patient location during evaluation: PACU Anesthesia Type: General Level of consciousness: awake and alert Pain management: pain level controlled Vital Signs Assessment: post-procedure vital signs reviewed and stable Respiratory status: spontaneous breathing, nonlabored ventilation, respiratory function stable and patient connected to nasal cannula oxygen Cardiovascular status: blood pressure returned to baseline and stable Postop Assessment: no apparent nausea or vomiting Anesthetic complications: no   There were no known notable events for this encounter.   Last Vitals:  Vitals:   12/09/22 1210 12/09/22 1243  BP: 101/62 121/85  Pulse: (!) 59 67  Resp: 13 15  Temp:  36.8 C  SpO2: 97% 96%    Last Pain:  Vitals:   12/09/22 1243  TempSrc: Oral  PainSc: 0-No pain                 Molli Barrows

## 2022-12-09 NOTE — Anesthesia Preprocedure Evaluation (Signed)
Anesthesia Evaluation  Patient identified by MRN, date of birth, ID band Patient awake    Reviewed: Allergy & Precautions, H&P , NPO status , Patient's Chart, lab work & pertinent test results, reviewed documented beta blocker date and time   Airway Mallampati: II  TM Distance: >3 FB Neck ROM: full    Dental  (+) Teeth Intact   Pulmonary COPD, Current Smoker and Patient abstained from smoking.   Pulmonary exam normal        Cardiovascular Exercise Tolerance: Good + CAD and + Past MI  Normal cardiovascular exam Rhythm:regular Rate:Normal     Neuro/Psych  PSYCHIATRIC DISORDERS Anxiety Depression Bipolar Disorder   negative neurological ROS     GI/Hepatic Neg liver ROS, PUD,,,  Endo/Other  negative endocrine ROS    Renal/GU negative Renal ROS  negative genitourinary   Musculoskeletal   Abdominal   Peds  Hematology negative hematology ROS (+)   Anesthesia Other Findings Past Medical History: No date: Coronary artery disease     Comment:  Non-ST elevation myocardial infarction in November 2019.              Cardiac catheterization showed 99% stenosis in the mid               RCA treated successfully with PCI and drug-eluting stent               placement in moderate mid left circumflex disease. No date: Gastric ulcer No date: Hyperlipidemia No date: Manic depression No date: Panic attacks No date: Tobacco use Past Surgical History: 07/25/2018: CORONARY/GRAFT ACUTE MI REVASCULARIZATION; N/A     Comment:  Procedure: Coronary/Graft Acute MI Revascularization;                Surgeon: Wellington Hampshire, MD;  Location: [place]               CV LAB;  Service: Cardiovascular;  Laterality: N/A; 07/25/2018: LEFT HEART CATH AND CORONARY ANGIOGRAPHY; N/A     Comment:  Procedure: LEFT HEART CATH AND CORONARY ANGIOGRAPHY;                Surgeon: Wellington Hampshire, MD;  Location: [place]               CV LAB;   Service: Cardiovascular;  Laterality: N/A; No date: SMALL INTESTINE SURGERY BMI    Body Mass Index: 23.10 kg/m     Reproductive/Obstetrics negative OB ROS                             Anesthesia Physical Anesthesia Plan  ASA: 3  Anesthesia Plan: General ETT   Post-op Pain Management:    Induction:   PONV Risk Score and Plan: 2  Airway Management Planned:   Additional Equipment:   Intra-op Plan:   Post-operative Plan:   Informed Consent: I have reviewed the patients History and Physical, chart, labs and discussed the procedure including the risks, benefits and alternatives for the proposed anesthesia with the patient or authorized representative who has indicated his/her understanding and acceptance.     Dental Advisory Given  Plan Discussed with: CRNA  Anesthesia Plan Comments:        Anesthesia Quick Evaluation

## 2022-12-09 NOTE — Transfer of Care (Signed)
Immediate Anesthesia Transfer of Care Note  Patient: Thomas Romero  Procedure(s) Performed: XI ROBOTIC ASSISTED LAPAROSCOPIC CHOLECYSTECTOMY (Abdomen) INDOCYANINE GREEN FLUORESCENCE IMAGING (ICG) (Abdomen) LYSIS OF ADHESION (Abdomen)  Patient Location: PACU  Anesthesia Type:General  Level of Consciousness: drowsy  Airway & Oxygen Therapy: Patient Spontanous Breathing and Patient connected to face mask oxygen  Post-op Assessment: Post -op Vital signs reviewed and stable  Post vital signs: Reviewed and stable  Last Vitals:  Vitals Value Taken Time  BP 114/72 12/09/22 1118  Temp 36.1 C 12/09/22 1118  Pulse 75 12/09/22 1122  Resp 9 12/09/22 1122  SpO2 98 % 12/09/22 1122  Vitals shown include unvalidated device data.  Last Pain:  Vitals:   12/09/22 0859  TempSrc: Oral  PainSc:          Complications: No notable events documented.

## 2022-12-09 NOTE — Progress Notes (Signed)
[facility] Progress Note  Patient: Thomas Romero    [id number]  DOA: 12/07/2022     Date of Service: the patient was seen and examined on 12/09/2022  Chief Complaint  Patient presents with   Chest Pain   Brief hospital course: Thomas Romero is a 57 y.o. male with PMH of CAD, HLD, tobacco abuse, depression, likely obstructive lung disease presenting with chest pain and choledocholithiasis.  Patient reports onset of central chest pain around midnight tonight.  Symptoms persisted despite treatment with as needed nitroglycerin and aspirin..  No reported radiation of the neck or down the left arm.  No associated diaphoresis.  Mild nausea.  No fevers or chills.  Minimal to mild abdominal pain.  No reported diarrhea.  Symptoms persisted despite treatment.  Still smoking 1 to 2 packs/day.  Patient reports smoking since he was 57 years old. Presented to the ER afebrile, hemodynamically stable.  Troponin negative x 2.  K3.3.  AST 115, ALT 63.  T. bili 0.9.  EKG normal sinus rhythm.  Chest x-ray stable.  Abdominal ultrasound showed cholelithiasis as well as mild dilatation of extrahepatic bile duct concerning for choledocholithiasis.   MRCP: positive for choledocholithiasis with very mild common bile duct dilatation and cholelithiasis without evidence of acute cholecystitis.   Per report, Dr. Allen Norris available for ERCP in am on 4/2  Assessment and Plan:   # Choledocholithiasis and cholelithiasis ACS ruled out, troponin negative MRCP reviewed GI consulted s/p ERCP, tolerated procedure well Continue symptomatic treatment GI recommended to consult general surgery for possible lap chole General surgery consulted s/p lap chole done on 4/3 Diet advanced to low-fat diet, we will monitor LFTs tomorrow a.m. and then discharge    # COPD stable Continue Spiriva  # Hypertension and hyperlipidemia Continued home meds aspirin, statin, Zetia, Toprol-XL Monitor BP and titrate medication accordingly  #  Nicotine dependence, patient smokes cigarettes 1-2 pack/day, smoking cessation counseling done   Body mass index is 23.1 kg/m.  Interventions:       Diet: CLD DVT Prophylaxis: SCD, pharmacological prophylaxis contraindicated due to risk of bleeding post ERCP    Advance goals of care discussion: Full code  Family Communication: family was present at bedside, at the time of interview.  The pt provided permission to discuss medical plan with the family. Opportunity was given to ask question and all questions were answered satisfactorily.   Disposition:  Pt is from Home, admitted with CBD and GD stone, s/p ERCP and s/p lap chole done today, which precludes a safe discharge. Discharge to home most likely tomorrow a.m.  Subjective: No significant events overnight, s/p lap chole, patient was having abdominal soreness, denies any other complaints. Will monitor LFTs tomorrow a.m. and then discharge.   Physical Exam: General: NAD, lying comfortably Appear in no distress, affect appropriate Eyes: PERRLA ENT: Oral Mucosa Clear, moist  Neck: no JVD,  Cardiovascular: S1 and S2 Present, no Murmur,  Respiratory: good respiratory effort, Bilateral Air entry equal and Decreased, no Crackles, no wheezes Abdomen: Bowel Sound present, Soft and mild generalized tenderness,  Skin: no rashes Extremities: no Pedal edema, no calf tenderness Neurologic: without any new focal findings Gait not checked due to patient safety concerns  Vitals:   12/09/22 1145 12/09/22 1200 12/09/22 1210 12/09/22 1243  BP: 114/66 106/66 101/62 121/85  Pulse: 68 65 (!) 59 67  Resp: 14 11 13 15   Temp:  (!) 96.9 F (36.1 C)  98.2 F (36.8 C)  TempSrc:  Oral  SpO2: 96% 97% 97% 96%  Weight:      Height:        Intake/Output Summary (Last 24 hours) at 12/09/2022 1438 Last data filed at 12/09/2022 1200 Gross per 24 hour  Intake 3006.53 ml  Output --  Net 3006.53 ml   Filed Weights   12/08/22 1018  Weight: 73 kg     Data Reviewed: I have personally reviewed and interpreted daily labs, tele strips, imagings as discussed above. I reviewed all nursing notes, pharmacy notes, vitals, pertinent old records I have discussed plan of care as described above with RN and patient/family.  CBC: Recent Labs  Lab 12/07/22 0114 12/08/22 0416 12/09/22 0412  WBC 8.5 4.0 13.1*  HGB 13.9 13.0 13.7  HCT 40.6 37.8* 40.2  MCV 92.1 92.2 92.0  PLT 151 105* [id number]*   Basic Metabolic Panel: Recent Labs  Lab 12/07/22 0120 12/08/22 0416 12/09/22 0412  NA 139 140 139  K 3.3* 3.7 4.4  CL 107 106 108  CO2 25 27 24   GLUCOSE 123* 105* 145*  BUN 15 13 9   CREATININE 1.23 0.92 0.94  CALCIUM 8.9 8.6* 9.1  MG  --   --  1.9  PHOS  --   --  2.6    Studies: No results found.  Scheduled Meds:  atorvastatin  80 mg Oral Daily   Chlorhexidine Gluconate Cloth  6 each Topical Q0600   Chlorhexidine Gluconate Cloth  6 each Topical Q0600   ezetimibe  10 mg Oral Daily   metoprolol succinate  50 mg Oral Daily   nicotine  21 mg Transdermal Daily   tiotropium  18 mcg Inhalation Daily   Continuous Infusions:  cefTRIAXone (ROCEPHIN)  IV 2 g (12/08/22 2318)   dextrose 5 % and 0.45% NaCl 75 mL/hr at 12/09/22 1248   famotidine (PEPCID) IV     PRN Meds: morphine injection, [DISCONTINUED] ondansetron **OR** ondansetron (ZOFRAN) IV, traZODone  Time spent: 35 minutes  Author: Val Riles. MD [facility] 12/09/2022 2:38 PM  To reach On-call, see care teams to locate the attending and reach out to them via [email or web address]. If 7PM-7AM, please contact night-coverage If you still have difficulty reaching the attending provider, please page the [facility] (Director on Call) for [facility] on amion for assistance.

## 2022-12-09 NOTE — TOC CM/SW Note (Signed)
  Transition of Care [facility]) Screening Note   Patient Details  Name: Thomas Romero Date of Birth: [birth year]   Transition of Care [facility]) CM/SW Contact:    Candie Chroman, LCSW Phone Number: 12/09/2022, 8:18 AM    Transition of Care Department [facility]) has reviewed patient and no TOC needs have been identified at this time. We will continue to monitor patient advancement through interdisciplinary progression rounds. If new patient transition needs arise, please place a TOC consult.

## 2022-12-09 NOTE — Progress Notes (Signed)
[facility] Day(s): 2.   Post op day(s): 1 Day Post-Op.   Interval History:  Patient seen and examined No acute events or new complaints overnight.  Patient reports he is doing well No abdominal pain, fever, chills, nausea, emesis, juandice  Leukocytosis this AM to 13.1K LFTs stable; bilirubin remains normal to 0.6 On ceftriaxone NPO this AM Plan for robotic assisted laparoscopic cholecystectomy this morning  Vital signs in last 24 hours: [min-max] current  Temp:  [96 F (35.6 C)-98 F (36.7 C)] 98 F (36.7 C) (04/02 2358) Pulse Rate:  [54-92] 61 (04/02 2358) Resp:  [17-29] 20 (04/02 2358) BP: (110-147)/(60-81) 118/64 (04/02 2358) SpO2:  [94 %-98 %] 96 % (04/02 2358) Weight:  [73 kg] 73 kg (04/02 1018)     Height: 5\' 10"  (177.8 cm) Weight: 73 kg BMI (Calculated): 23.1   Intake/Output last 2 shifts:  04/02 0701 - 04/03 0700 In: 3206.5 [P.O.:200; I.V.:2906.5; IV Piggyback:100] Out: 400 [Urine:400]   Physical Exam:  Constitutional: alert, cooperative and no distress  Respiratory: breathing non-labored at rest  Cardiovascular: regular rate and sinus rhythm  Gastrointestinal: soft, non-tender, and non-distended Integumentary: warm, dry, no juandice  Labs:     Latest Ref Rng & Units 12/09/2022    4:12 AM 12/08/2022    4:16 AM 12/07/2022    1:14 AM  CBC  WBC 4.0 - 10.5 K/uL 13.1  4.0  8.5   Hemoglobin 13.0 - 17.0 g/dL 13.7  13.0  13.9   Hematocrit 39.0 - 52.0 % 40.2  37.8  40.6   Platelets 150 - 400 K/uL 143  105  151       Latest Ref Rng & Units 12/09/2022    4:12 AM 12/08/2022    4:16 AM 12/07/2022    1:20 AM  CMP  Glucose 70 - 99 mg/dL 145  105  123   BUN 6 - 20 mg/dL 9  13  15    Creatinine 0.61 - 1.24 mg/dL 0.94  0.92  1.23   Sodium 135 - 145 mmol/L 139  140  139   Potassium 3.5 - 5.1 mmol/L 4.4  3.7  3.3   Chloride 98 - 111 mmol/L 108  106  107   CO2 22 - 32 mmol/L 24  27  25    Calcium 8.9 - 10.3 mg/dL 9.1  8.6  8.9    Total Protein 6.5 - 8.1 g/dL 6.8  6.0  6.3   Total Bilirubin 0.3 - 1.2 mg/dL 0.6  1.0  0.9   Alkaline Phos 38 - 126 U/L 158  140  95   AST 15 - 41 U/L 85  161  115   ALT 0 - 44 U/L 149  160  63      Imaging studies: No new pertinent imaging studies   Assessment/Plan:  57 y.o. male with choledocholithiasis s/p ERCP on 04/02   - Plan for robotic assisted laparoscopic cholecystectomy today with Dr Dahlia Byes - All risks, benefits, and alternatives to above procedure(s) were discussed with the patient, all of his questions were answered to his expressed satisfaction, patient expresses he wishes to proceed, and informed consent was obtained.    - NPO + IVF Resuscitation  - IV Abx (Ceftriaxone)  - Monitor abdominal examination  - Pain control prn; antiemetics prn  - Monitor leukocytosis   - Mobilize - Further management per primary service    All of the above findings and recommendations were discussed with the  patient, patient's family (wife), and the medical team, and all of patient's and family's questions were answered to their expressed satisfaction.  -- Edison Simon, PA-C [facility] 12/09/2022, 7:15 AM M-F: 7am - 4pm

## 2022-12-09 NOTE — Discharge Instructions (Addendum)
In addition to included general post-operative instructions,  Diet: Resume home diet. Recommend avoiding or limiting fatty/greasy foods over the next few days/week. If you do eat these, you may (or may not) notice diarrhea. This is expected while your body adjusts to not having a gallbladder, and it typically resolves with time.    Activity: No heavy lifting >20 pounds (children, pets, laundry, garbage) or strenuous activity for 4 weeks, but light activity and walking are encouraged. Do not drive or drink alcohol if taking narcotic pain medications or having pain that might distract from driving.  Wound care: 2 days after surgery (04/05), you may shower/get incision wet with soapy water and pat dry (do not rub incisions), but no baths or submerging incision underwater until follow-up.   Medications: Resume all home medications. For mild to moderate pain: acetaminophen (Tylenol) or ibuprofen/naproxen (if no kidney disease). Combining Tylenol with alcohol can substantially increase your risk of causing liver disease. Narcotic pain medications, if prescribed, can be used for severe pain, though may cause nausea, constipation, and drowsiness. Do not combine Tylenol and Percocet (or similar) within a 6 hour period as Percocet (and similar) contain(s) Tylenol. If you do not need the narcotic pain medication, you do not need to fill the prescription.  Call office [phone] / [phone]) at any time if any questions, worsening pain, fevers/chills, bleeding, drainage from incision site, or other concerns.  Laparoscopic Cholecystectomy, Care After   These instructions give you information on caring for yourself after your procedure. Your doctor may also give you more specific instructions. Call your doctor if you have any problems or questions after your procedure.  HOME CARE  Change your bandages (dressings) as told by your doctor.  Keep the wound dry and clean. Wash the wound gently with soap and  water. Pat the wound dry with a clean towel.  Do not take baths, swim, or use hot tubs for 2 weeks, or as told by your doctor.  Only take medicine as told by your doctor.  Eat a normal diet as told by your doctor.  Do not lift anything heavier than 10 pounds (4.5 kg) until your doctor says it is okay.  Do not play contact sports for 1 week, or as told by your doctor. GET HELP IF:  Your wound is red, puffy (swollen), or painful.  You have yellowish-white fluid (pus) coming from the wound.  You have fluid draining from the wound for more than 1 day.  You have a bad smell coming from the wound.  Your wound breaks open. GET HELP RIGHT AWAY IF:  You have trouble breathing.  You have chest pain.  You have a fever >101  You have pain in the shoulders (shoulder strap areas) that is getting worse.  You feel dizzy or pass out (faint).  You have severe belly (abdominal) pain.  You feel sick to your stomach (nauseous) or throw up (vomit) for more than 1 day.  AMBULATORY SURGERY  DISCHARGE INSTRUCTIONS   The drugs that you were given will stay in your system until tomorrow so for the next 24 hours you should not:  Drive an automobile Make any legal decisions Drink any alcoholic beverage   You may resume regular meals tomorrow.  Today it is better to start with liquids and gradually work up to solid foods.  You may eat anything you prefer, but it is better to start with liquids, then soup and crackers, and gradually work up to solid foods.  Please notify your doctor immediately if you have any unusual bleeding, trouble breathing, redness and pain at the surgery site, drainage, fever, or pain not relieved by medication.    Additional Instructions:        Please contact your physician with any problems or Same Day Surgery at [phone], Monday through Friday 6 am to 4 pm, or [place] at [facility] number at [phone].

## 2022-12-09 NOTE — Anesthesia Procedure Notes (Signed)
Procedure Name: Intubation Date/Time: 12/09/2022 9:45 AM  Performed by: Patience Musca., CRNAPre-anesthesia Checklist: Patient identified, Patient being monitored, Timeout performed, Emergency Drugs available and Suction available Patient Re-evaluated:Patient Re-evaluated prior to induction Oxygen Delivery Method: Circle system utilized Preoxygenation: Pre-oxygenation with 100% oxygen Induction Type: IV induction Ventilation: Mask ventilation without difficulty Laryngoscope Size: 3 and McGraph Grade View: Grade I Tube type: Oral Tube size: 7.0 mm Number of attempts: 1 Airway Equipment and Method: Stylet Placement Confirmation: ETT inserted through vocal cords under direct vision, positive ETCO2 and breath sounds checked- equal and bilateral Secured at: 21 cm Tube secured with: Tape Dental Injury: Teeth and Oropharynx as per pre-operative assessment

## 2022-12-10 DIAGNOSIS — K8050 Calculus of bile duct without cholangitis or cholecystitis without obstruction: Secondary | ICD-10-CM | POA: Diagnosis not present

## 2022-12-10 LAB — BASIC METABOLIC PANEL
Anion gap: 7 (ref 5–15)
BUN: 11 mg/dL (ref 6–20)
CO2: 25 mmol/L (ref 22–32)
Calcium: 8.8 mg/dL — ABNORMAL LOW (ref 8.9–10.3)
Chloride: 105 mmol/L (ref 98–111)
Creatinine, Ser: 0.82 mg/dL (ref 0.61–1.24)
GFR, Estimated: >60 mL/min (ref >60)
Glucose, Bld: 131 mg/dL — ABNORMAL HIGH (ref 70–99)
Potassium: 4 mmol/L (ref 3.5–5.1)
Sodium: 137 mmol/L (ref 135–145)

## 2022-12-10 LAB — HEPATIC FUNCTION PANEL
ALT: 129 U/L — ABNORMAL HIGH (ref 0–44)
AST: 64 U/L — ABNORMAL HIGH (ref 15–41)
Albumin: 3.7 g/dL (ref 3.5–5.0)
Alkaline Phosphatase: 122 U/L (ref 38–126)
Bilirubin, Direct: <0.1 mg/dL (ref 0.0–0.2)
Total Bilirubin: 0.5 mg/dL (ref 0.3–1.2)
Total Protein: 6.3 g/dL — ABNORMAL LOW (ref 6.5–8.1)

## 2022-12-10 LAB — LIPASE, BLOOD: Lipase: 23 U/L (ref 11–51)

## 2022-12-10 LAB — PHOSPHORUS: Phosphorus: 3.2 mg/dL (ref 2.5–4.6)

## 2022-12-10 LAB — CBC
HCT: 38.3 % — ABNORMAL LOW (ref 39.0–52.0)
Hemoglobin: 13.1 g/dL (ref 13.0–17.0)
MCH: 31.7 pg (ref 26.0–34.0)
MCHC: 34.2 g/dL (ref 30.0–36.0)
MCV: 92.7 fL (ref 80.0–100.0)
Platelets: 130 10*3/uL — ABNORMAL LOW (ref 150–400)
RBC: 4.13 MIL/uL — ABNORMAL LOW (ref 4.22–5.81)
RDW: 13 % (ref 11.5–15.5)
WBC: 14.3 10*3/uL — ABNORMAL HIGH (ref 4.0–10.5)
nRBC: 0 % (ref 0.0–0.2)

## 2022-12-10 LAB — SURGICAL PATHOLOGY

## 2022-12-10 LAB — MAGNESIUM: Magnesium: 2.2 mg/dL (ref 1.7–2.4)

## 2022-12-10 MED ORDER — ACETAMINOPHEN 325 MG PO TABS: 650.0000 mg | ORAL_TABLET | Freq: Three times a day (TID) | ORAL | Status: AC | PRN

## 2022-12-10 MED ORDER — AMOXICILLIN-POT CLAVULANATE 875-125 MG PO TABS: 1.0000 | ORAL_TABLET | Freq: Two times a day (BID) | ORAL | 0 refills | Status: AC

## 2022-12-10 MED ORDER — PANTOPRAZOLE SODIUM 40 MG PO TBEC: 40.0000 mg | DELAYED_RELEASE_TABLET | Freq: Every day | ORAL | 1 refills | Status: AC

## 2022-12-10 NOTE — Care Management Important Message (Signed)
Important Message  Patient Details  Name: DILLINGER RUVALCABA MRN: [id number] Date of Birth: 1966/06/22   Medicare Important Message Given:  N/A - LOS <3 / Initial given by admissions     Dannette Barbara 12/10/2022, 12:55 PM

## 2022-12-10 NOTE — Discharge Summary (Signed)
[facility] Discharge Summary   Patient: LARODERICK BRAUCH [id number]  PCP: [facility] [place]  Date of admission: 12/07/2022   Date of discharge:  12/10/2022     Discharge Diagnoses:  Principal Problem:   Choledocholithiasis Active Problems:   Non-ST elevation (NSTEMI) myocardial infarction   COPD (chronic obstructive pulmonary disease)   Tobacco abuse counseling   HLD (hyperlipidemia)   Calculus of gallbladder with biliary obstruction but without cholecystitis   Admitted From: Home Disposition:  Home  Recommendations for Outpatient Follow-up:  F/u PCP in 1 wk, F/u Sx in 1-2 weeks for post op check Follow up LABS/TEST:  CBC and CMP in 1 wk   Follow-up Information     Tylene Fantasia, PA-C. Schedule an appointment as soon as possible for a visit on 12/31/2022.   Specialty: Physician Assistant Why: s/p lap chole at 1:30 pm Contact information: [address] [place] [address] [phone]                Diet recommendation: Cardiac diet  Activity: The patient is advised to gradually reintroduce usual activities, as tolerated  Discharge Condition: stable  Code Status: Full code   History of present illness: As per the H and P dictated on admission  Hospital Course:  JASIAS HARDIMAN is a 57 y.o. male with PMH of CAD, HLD, tobacco abuse, depression, likely obstructive lung disease presenting with chest pain and choledocholithiasis.  Patient reports onset of central chest pain around midnight tonight.  Symptoms persisted despite treatment with as needed nitroglycerin and aspirin..  No reported radiation of the neck or down the left arm.  No associated diaphoresis.  Mild nausea.  No fevers or chills.  Minimal to mild abdominal pain.  No reported diarrhea.  Symptoms persisted despite treatment.  Still smoking 1 to 2 packs/day.  Patient reports smoking since he was 57 years old. Presented to the ER afebrile, hemodynamically stable.   Troponin negative x 2.  K3.3.  AST 115, ALT 63.  T. bili 0.9.  EKG normal sinus rhythm.  Chest x-ray stable.  Abdominal ultrasound showed cholelithiasis as well as mild dilatation of extrahepatic bile duct concerning for choledocholithiasis.   MRCP: positive for choledocholithiasis with very mild common bile duct dilatation and cholelithiasis without evidence of acute cholecystitis.   Per report, Dr. Allen Norris available for ERCP in am on 4/2   Assessment and Plan: # Choledocholithiasis and cholelithiasis ACS ruled out, troponin negative. MRCP reviewed GI consulted s/p ERCP, tolerated procedure well, GI recommended to consult general surgery for possible lap chole.  General surgery consulted s/p lap chole done on 4/3, Diet advanced to low-fat diet.  Patient tolerated well.  Still LFTs are slightly elevated, patient was advised to follow with PCP to repeat CMP after 1 week.  Patient was cleared to discharge by general surgery and follow-up with an outpatient in 2 to 3 weeks. # COPD stable, Continue Spiriva # Hypertension and hyperlipidemia, Continued home meds aspirin, statin, Zetia, Toprol-XL. Monitor BP and follow-up with PCP to titrate medication accordingly # Nicotine dependence, patient smokes cigarettes 1-2 pack/day, smoking cessation counseling done but patient is not interested.  Body mass index is 23.1 kg/m.  Nutrition Interventions:   Patient was ambulatory without any assistance. On the day of the discharge the patient's vitals were stable, and no other acute medical condition were reported by patient. the patient was felt safe to be discharge at Home.  Consultants: GI and general surgery Procedures: s/p ERCP  and Lap chole  Discharge Exam: General: Appear in no distress, no Rash; Oral Mucosa Clear, moist. Cardiovascular: S1 and S2 Present, no Murmur, Respiratory: normal respiratory effort, Bilateral Air entry present and no Crackles, no wheezes Abdomen: Bowel Sound present, Soft and no  tenderness, no hernia Extremities: no Pedal edema, no calf tenderness Neurology: alert and oriented to time, place, and person affect appropriate.  Filed Weights   12/08/22 1018  Weight: 73 kg   Vitals:   12/09/22 2344 12/10/22 0757  BP: (!) 152/98 (!) 147/82  Pulse: 66 61  Resp: 20 18  Temp: 98 F (36.7 C) 98.2 F (36.8 C)  SpO2: 96% 98%    DISCHARGE MEDICATION: Allergies as of 12/10/2022       Reactions   Ibuprofen Other (See Comments)   Gastric ulcers        Medication List     TAKE these medications    acetaminophen 325 MG tablet Commonly known as: TYLENOL Take 2 tablets (650 mg total) by mouth every 8 (eight) hours as needed for moderate pain, fever, headache or mild pain.   albuterol 108 (90 Base) MCG/ACT inhaler Commonly known as: VENTOLIN HFA Inhale 1-2 puffs into the lungs as needed.   amoxicillin-clavulanate 875-125 MG tablet Commonly known as: AUGMENTIN Take 1 tablet by mouth 2 (two) times daily for 5 days.   aspirin 81 MG chewable tablet Chew 1 tablet (81 mg total) by mouth daily.   atorvastatin 80 MG tablet Commonly known as: LIPITOR Take 1 tablet by mouth once daily   ezetimibe 10 MG tablet Commonly known as: ZETIA Take 1 tablet by mouth once daily   metoprolol succinate 50 MG 24 hr tablet Commonly known as: TOPROL-XL Take 1 tablet (50 mg total) by mouth daily. Take with or immediately following a meal.   nitroGLYCERIN 0.4 MG SL tablet Commonly known as: Nitrostat Place 1 tablet (0.4 mg total) under the tongue every 5 (five) minutes as needed for chest pain.   pantoprazole 40 MG tablet Commonly known as: Protonix Take 1 tablet (40 mg total) by mouth daily.   Spiriva HandiHaler 18 MCG inhalation capsule Generic drug: tiotropium Place 18 mcg into inhaler and inhale daily.   traZODone 50 MG tablet Commonly known as: DESYREL Take 50 mg by mouth at bedtime as needed for sleep.       Allergies  Allergen Reactions   Ibuprofen  Other (See Comments)    Gastric ulcers   Discharge Instructions     Call MD for:  extreme fatigue   Complete by: As directed    Call MD for:  persistant dizziness or light-headedness   Complete by: As directed    Call MD for:  persistant nausea and vomiting   Complete by: As directed    Call MD for:  severe uncontrolled pain   Complete by: As directed    Call MD for:  temperature >100.4   Complete by: As directed    Diet - low sodium heart healthy   Complete by: As directed    Discharge instructions   Complete by: As directed    F/u PCP in 1 wk, CBC and CMP in  1wk F/u Sx in 1-2 weeks for post op check   Increase activity slowly   Complete by: As directed        The results of significant diagnostics from this hospitalization (including imaging, microbiology, ancillary and laboratory) are listed below for reference.    Significant Diagnostic Studies: DG C-Arm  1-60 Min-No Report  Result Date: 12/08/2022 Fluoroscopy was utilized by the requesting physician.  No radiographic interpretation.   MR ABDOMEN MRCP WO CONTRAST  Result Date: 12/07/2022 CLINICAL DATA:  57 year old male with history of abdominal pain. Cholelithiasis on ultrasound. Evaluate for choledocholithiasis and biliary obstruction. EXAM: MRI ABDOMEN WITHOUT CONTRAST  (INCLUDING MRCP) TECHNIQUE: Multiplanar multisequence MR imaging of the abdomen was performed. Heavily T2-weighted images of the biliary and pancreatic ducts were obtained, and three-dimensional MRCP images were rendered by post processing. COMPARISON:  No prior abdominal MRI. Right upper quadrant abdominal ultrasound 12/07/2022. CT of the abdomen and pelvis 10/25/2018. FINDINGS: Comment: Today's study is limited for detection and characterization of visceral and/or vascular lesions by lack of IV gadolinium. Additionally, extensive respiratory motion severely limits portions of this examination, including some MRCP sequences. Lower chest: Unremarkable.  Hepatobiliary: In the central aspect of segment 8 of the liver (axial image 9 of series 4) there is a well-circumscribed 8 mm T1 hypointense and T2 hyperintense lesion, likely a tiny cyst or biliary hamartoma but incompletely characterized on today's noncontrast examination (no imaging follow-up recommended). No other larger more suspicious appearingcystic or solid hepatic lesions are confidently identified on today's noncontrast examination. No intrahepatic biliary ductal dilatation. Gallbladder is nearly completely contracted around indwelling filling defects, presumably gallstones, largest of which measures 1.9 x 1.4 cm. MRCP images demonstrate mild dilatation of the common bile duct which measures up to 7 mm in the porta hepatis. There also appears to be a small filling defect measuring approximately 5 x 3 mm in the distal aspect of the common bile duct shortly before the ampulla (coronal MRCP image 12 of series 17), indicative of choledocholithiasis. Pancreas: No definite pancreatic mass or peripancreatic fluid collections or inflammatory changes noted on today's examination. No pancreatic ductal dilatation noted on MRCP imaging. Spleen:  Unremarkable. Adrenals/Urinary Tract: Right kidney and bilateral adrenal glands are normal in appearance. Multiple T1 hypointense, T2 hyperintense lesions in the kidneys, likely to represent cysts (incompletely characterized on today's noncontrast examination), largest of which in the upper pole of the left kidney (axial image 17 of series 4) measures up to 2.0 x 1.5 cm and has several thin internal septations (likely Bosniak class 2, no imaging follow-up recommended). No hydroureteronephrosis in the visualized portions of the abdomen. Stomach/Bowel: Visualized portions are unremarkable. Vascular/Lymphatic: No definite aneurysm identified in the visualized abdominal vasculature. No lymphadenopathy noted in the abdomen. Other: No significant volume of ascites noted in the  visualized portions of the peritoneal cavity. Musculoskeletal: No aggressive appearing osseous lesions are noted in the visualized portions of the skeleton. IMPRESSION: 1. Study is positive for choledocholithiasis with very mild common bile duct dilatation, but no frank intrahepatic biliary ductal dilatation at this time. Findings suggest mild obstruction of the distal common bile duct. 2. Cholelithiasis without evidence of acute cholecystitis. 3. Additional incidental findings, as above. Electronically Signed   By: Vinnie Langton M.D.   On: 12/07/2022 05:53   MR 3D Recon At Scanner  Result Date: 12/07/2022 CLINICAL DATA:  57 year old male with history of abdominal pain. Cholelithiasis on ultrasound. Evaluate for choledocholithiasis and biliary obstruction. EXAM: MRI ABDOMEN WITHOUT CONTRAST  (INCLUDING MRCP) TECHNIQUE: Multiplanar multisequence MR imaging of the abdomen was performed. Heavily T2-weighted images of the biliary and pancreatic ducts were obtained, and three-dimensional MRCP images were rendered by post processing. COMPARISON:  No prior abdominal MRI. Right upper quadrant abdominal ultrasound 12/07/2022. CT of the abdomen and pelvis 10/25/2018. FINDINGS: Comment: Today's study is limited  for detection and characterization of visceral and/or vascular lesions by lack of IV gadolinium. Additionally, extensive respiratory motion severely limits portions of this examination, including some MRCP sequences. Lower chest: Unremarkable. Hepatobiliary: In the central aspect of segment 8 of the liver (axial image 9 of series 4) there is a well-circumscribed 8 mm T1 hypointense and T2 hyperintense lesion, likely a tiny cyst or biliary hamartoma but incompletely characterized on today's noncontrast examination (no imaging follow-up recommended). No other larger more suspicious appearingcystic or solid hepatic lesions are confidently identified on today's noncontrast examination. No intrahepatic biliary ductal  dilatation. Gallbladder is nearly completely contracted around indwelling filling defects, presumably gallstones, largest of which measures 1.9 x 1.4 cm. MRCP images demonstrate mild dilatation of the common bile duct which measures up to 7 mm in the porta hepatis. There also appears to be a small filling defect measuring approximately 5 x 3 mm in the distal aspect of the common bile duct shortly before the ampulla (coronal MRCP image 12 of series 17), indicative of choledocholithiasis. Pancreas: No definite pancreatic mass or peripancreatic fluid collections or inflammatory changes noted on today's examination. No pancreatic ductal dilatation noted on MRCP imaging. Spleen:  Unremarkable. Adrenals/Urinary Tract: Right kidney and bilateral adrenal glands are normal in appearance. Multiple T1 hypointense, T2 hyperintense lesions in the kidneys, likely to represent cysts (incompletely characterized on today's noncontrast examination), largest of which in the upper pole of the left kidney (axial image 17 of series 4) measures up to 2.0 x 1.5 cm and has several thin internal septations (likely Bosniak class 2, no imaging follow-up recommended). No hydroureteronephrosis in the visualized portions of the abdomen. Stomach/Bowel: Visualized portions are unremarkable. Vascular/Lymphatic: No definite aneurysm identified in the visualized abdominal vasculature. No lymphadenopathy noted in the abdomen. Other: No significant volume of ascites noted in the visualized portions of the peritoneal cavity. Musculoskeletal: No aggressive appearing osseous lesions are noted in the visualized portions of the skeleton. IMPRESSION: 1. Study is positive for choledocholithiasis with very mild common bile duct dilatation, but no frank intrahepatic biliary ductal dilatation at this time. Findings suggest mild obstruction of the distal common bile duct. 2. Cholelithiasis without evidence of acute cholecystitis. 3. Additional incidental findings,  as above. Electronically Signed   By: Vinnie Langton M.D.   On: 12/07/2022 05:53   US ABDOMEN LIMITED RUQ (LIVER/GB)  Result Date: 12/07/2022 CLINICAL DATA:  Right upper quadrant abdominal pain, elevated liver enzymes EXAM: ULTRASOUND ABDOMEN LIMITED RIGHT UPPER QUADRANT COMPARISON:  CT and sonogram 10/25/2018 FINDINGS: Gallbladder: The gallbladder is contracted contributing to apparent gallbladder wall thickening. Cholelithiasis noted with dense shadowing. No significant pericholecystic fluid is identified. The sonographic Percell Miller sign is reportedly negative. Common bile duct: Diameter: 7 mm in proximal diameter Liver: 7 mm simple cyst noted within the right hepatic lobe. No solid intrahepatic mass identified. No intrahepatic biliary ductal dilation. Normal parenchymal echogenicity and echotexture. Portal vein is patent on color Doppler imaging with normal direction of blood flow towards the liver. Other: None. IMPRESSION: 1. Cholelithiasis without sonographic evidence of acute cholecystitis. 2. Mild dilation of the extrahepatic bile duct, slightly progressive since prior examination. If there is clinical concern for a distal obstructing lesion, such as choledocholithiasis, ERCP or MRCP examination is recommended for further evaluation. Electronically Signed   By: Fidela Salisbury M.D.   On: 12/07/2022 03:15   DG Chest 2 View  Result Date: 12/07/2022 CLINICAL DATA:  Chest pain EXAM: CHEST - 2 VIEW COMPARISON:  07/24/2018 FINDINGS: The heart size  and mediastinal contours are within normal limits. Both lungs are clear. The visualized skeletal structures are unremarkable. IMPRESSION: No active cardiopulmonary disease. Electronically Signed   By: Ulyses Jarred M.D.   On: 12/07/2022 02:08    Microbiology: No results found for this or any previous visit (from the past 240 hour(s)).   Labs: CBC: Recent Labs  Lab 12/07/22 0114 12/08/22 0416 12/09/22 0412 12/10/22 0436  WBC 8.5 4.0 13.1* 14.3*  HGB 13.9  13.0 13.7 13.1  HCT 40.6 37.8* 40.2 38.3*  MCV 92.1 92.2 92.0 92.7  PLT 151 105* 143* [id number]*   Basic Metabolic Panel: Recent Labs  Lab 12/07/22 0120 12/08/22 0416 12/09/22 0412 12/10/22 0436  NA 139 140 139 137  K 3.3* 3.7 4.4 4.0  CL 107 106 108 105  CO2 25 27 24 25   GLUCOSE 123* 105* 145* 131*  BUN 15 13 9 11   CREATININE 1.23 0.92 0.94 0.82  CALCIUM 8.9 8.6* 9.1 8.8*  MG  --   --  1.9 2.2  PHOS  --   --  2.6 3.2   Liver Function Tests: Recent Labs  Lab 12/07/22 0120 12/08/22 0416 12/09/22 0412 12/10/22 0436  AST 115* 161* 85* 64*  ALT 63* 160* 149* 129*  ALKPHOS 95 140* 158* 122  BILITOT 0.9 1.0 0.6 0.5  PROT 6.3* 6.0* 6.8 6.3*  ALBUMIN 4.0 3.7 3.8 3.7   Recent Labs  Lab 12/07/22 0120 12/09/22 0412 12/10/22 0436  LIPASE 35 26 23   No results for input(s): "AMMONIA" in the last 168 hours. Cardiac Enzymes: No results for input(s): "CKTOTAL", "CKMB", "CKMBINDEX", "TROPONINI" in the last 168 hours. BNP (last 3 results) No results for input(s): "BNP" in the last 8760 hours. CBG: No results for input(s): "GLUCAP" in the last 168 hours.  Time spent: 35 minutes  Signed:  Val Riles  [facility]  12/10/2022 11:49 AM

## 2022-12-10 NOTE — Progress Notes (Signed)
[place] Hospital Day(s): 3.   Post op day(s): 1 Day Post-Op.   Interval History:  Patient seen and examined No acute events or new complaints overnight.  Patient reports he is doing well Minimal abdominal soreness; worse on the right No fever, chills, nausea, emesis, juandice  Leukocytosis this AM to 14.3K; likely reactive from OR LFTs stable; bilirubin remains normal to 0.5 On FLD; wants solid food No othre complaints   Vital signs in last 24 hours: [min-max] current  Temp:  [96.9 F (36.1 C)-98.2 F (36.8 C)] 98.2 F (36.8 C) (04/04 0757) Pulse Rate:  [59-76] 61 (04/04 0757) Resp:  [10-20] 18 (04/04 0757) BP: (101-152)/(62-98) 147/82 (04/04 0757) SpO2:  [93 %-99 %] 98 % (04/04 0757)     Height: 5\' 10"  (177.8 cm) Weight: 73 kg BMI (Calculated): 23.1   Intake/Output last 2 shifts:  04/03 0701 - 04/04 0700 In: 600 [I.V.:600] Out: -    Physical Exam:  Constitutional: alert, cooperative and no distress  Respiratory: breathing non-labored at rest  Cardiovascular: regular rate and sinus rhythm  Gastrointestinal: soft, non-tender, and non-distended Integumentary: Laparoscopic incisions are CDI with dermabond; no erythema or drainage    Labs:     Latest Ref Rng & Units 12/10/2022    4:36 AM 12/09/2022    4:12 AM 12/08/2022    4:16 AM  CBC  WBC 4.0 - 10.5 K/uL 14.3  13.1  4.0   Hemoglobin 13.0 - 17.0 g/dL 13.1  13.7  13.0   Hematocrit 39.0 - 52.0 % 38.3  40.2  37.8   Platelets 150 - 400 K/uL 130  143  105       Latest Ref Rng & Units 12/10/2022    4:36 AM 12/09/2022    4:12 AM 12/08/2022    4:16 AM  CMP  Glucose 70 - 99 mg/dL 131  145  105   BUN 6 - 20 mg/dL 11  9  13    Creatinine 0.61 - 1.24 mg/dL 0.82  0.94  0.92   Sodium 135 - 145 mmol/L 137  139  140   Potassium 3.5 - 5.1 mmol/L 4.0  4.4  3.7   Chloride 98 - 111 mmol/L 105  108  106   CO2 22 - 32 mmol/L 25  24  27    Calcium 8.9 - 10.3 mg/dL 8.8  9.1  8.6   Total Protein  6.5 - 8.1 g/dL 6.3  6.8  6.0   Total Bilirubin 0.3 - 1.2 mg/dL 0.5  0.6  1.0   Alkaline Phos 38 - 126 U/L 122  158  140   AST 15 - 41 U/L 64  85  161   ALT 0 - 44 U/L 129  149  160      Imaging studies: No new pertinent imaging studies   Assessment/Plan:  57 y.o. male with choledocholithiasis s/p ERCP on 04/02   - Okay for regular diet; recommend low fat initially  - Okay to stop Abx from [employer] perspective  - Monitor abdominal examination  - Pain control prn; antiemetics prn  - Monitor leukocytosis   - Mobilize - Further management per primary service     - Discharge Planning; Okay for discharge from surgical perspective; follow up in 2-3 weeks; instructions and follow up updated.   All of the above findings and recommendations were discussed with the patient, patient's family (wife), and the medical team, and all of patient's and family's questions were answered  to their expressed satisfaction.  -- Edison Simon, PA-C [facility] 12/10/2022, 8:44 AM M-F: 7am - 4pm

## 2022-12-31 ENCOUNTER — Ambulatory Visit (INDEPENDENT_AMBULATORY_CARE_PROVIDER_SITE_OTHER): Payer: 59 | Admitting: Physician Assistant

## 2022-12-31 ENCOUNTER — Encounter: Payer: Self-pay | Admitting: Physician Assistant

## 2022-12-31 VITALS — BP 145/87 | HR 64 | Temp 97.8°F | Ht 70.0 in | Wt 154.0 lb

## 2022-12-31 DIAGNOSIS — K8064 Calculus of gallbladder and bile duct with chronic cholecystitis without obstruction: Secondary | ICD-10-CM

## 2022-12-31 DIAGNOSIS — Z09 Encounter for follow-up examination after completed treatment for conditions other than malignant neoplasm: Secondary | ICD-10-CM

## 2022-12-31 DIAGNOSIS — K8050 Calculus of bile duct without cholangitis or cholecystitis without obstruction: Secondary | ICD-10-CM

## 2022-12-31 NOTE — Patient Instructions (Signed)

## 2022-12-31 NOTE — Progress Notes (Signed)
[facility] POST-OP OFFICE VISIT  12/31/2022  HPI: Thomas Romero is a 57 y.o. male 22 days s/p robotic assisted laparoscopic cholecystectomy for choledocholithiasis with Dr Everlene Farrier   He has done extremely well No abdominal pain No fever, chills, nausea, emesis, or bowel changes Tolerating PO Ambulating well No issues with incisions   Vital signs: BP (!) 145/87   Pulse 64   Temp 97.8 F (36.6 C) (Oral)   Ht  (1.778 m)   Wt 154 lb (69.9 kg)   SpO2 99%   BMI 22.10 kg/m    Physical Exam: Constitutional: Well appearing male, NAD Abdomen: Soft, non-tender, non-distended, no rebound/guarding Skin: Laparoscopic incisions are healing well, no erythema or drainage   Assessment/Plan: This is a 57 y.o. male 22 days s/p robotic assisted laparoscopic cholecystectomy for choledocholithiasis with Dr Everlene Farrier    - Pain control prn  - Reviewed wound care recommendation  - Reviewed lifting restrictions; 4 weeks total  - Reviewed surgical pathology; Acute on chronic cholecystitis  - He can follow up on as needed basis; He understands to call with questions/concerns  -- Lynden Oxford, PA-C [facility] 12/31/2022, 1:40 PM M-F: 7am - 4pm

## 2023-01-08 ENCOUNTER — Encounter: Payer: Self-pay | Admitting: Cardiovascular Disease

## 2023-01-08 ENCOUNTER — Ambulatory Visit: Payer: 59 | Attending: Cardiovascular Disease | Admitting: Cardiovascular Disease

## 2023-01-08 VITALS — BP 124/70 | HR 56 | Ht 70.0 in | Wt 155.5 lb

## 2023-01-08 DIAGNOSIS — Z720 Tobacco use: Secondary | ICD-10-CM

## 2023-01-08 DIAGNOSIS — I2510 Atherosclerotic heart disease of native coronary artery without angina pectoris: Secondary | ICD-10-CM | POA: Diagnosis not present

## 2023-01-08 DIAGNOSIS — E785 Hyperlipidemia, unspecified: Secondary | ICD-10-CM | POA: Diagnosis not present

## 2023-01-08 MED ORDER — ATORVASTATIN CALCIUM 80 MG PO TABS: 80.0000 mg | ORAL_TABLET | Freq: Every day | ORAL | 3 refills | Status: DC

## 2023-01-08 NOTE — Patient Instructions (Signed)
Medication Instructions:  No changes *If you need a refill on your cardiac medications before your next appointment, please call your pharmacy*   Lab Work: None ordered If you have labs (blood work) drawn today and your tests are completely normal, you will receive your results only by: MyChart Message (if you have MyChart) OR A paper copy in the mail If you have any lab test that is abnormal or we need to change your treatment, we will call you to review the results.   Testing/Procedures: None ordered   Follow-Up: At [facility], you and your health needs are our priority.  As part of our continuing mission to provide you with exceptional heart care, we have created designated Provider Care Teams.  These Care Teams include your primary Cardiologist (physician) and Advanced Practice Providers (APPs -  Physician Assistants and Nurse Practitioners) who all work together to provide you with the care you need, when you need it.  We recommend signing up for the patient portal called "MyChart".  Sign up information is provided on this After Visit Summary.  MyChart is used to connect with patients for Virtual Visits (Telemedicine).  Patients are able to view lab/test results, encounter notes, upcoming appointments, etc.  Non-urgent messages can be sent to your provider as well.   To learn more about what you can do with MyChart, go to [email or web address].    Your next appointment:   12 month(s)  Provider:   You may see Muhammad Arida, MD or one of the following Advanced Practice Providers on your designated Care Team:   Christopher Berge, NP Ryan Dunn, PA-C Cadence Furth, PA-C Sheri Hammock, NP    

## 2023-01-08 NOTE — Progress Notes (Signed)
Cardiology Office Note   Date:  01/08/2023   ID:  [name], DOB [birth year], MRN [id number]  PCP:  [facility]  Cardiologist:   Lorine Bears, MD   Chief Complaint  Patient presents with   Follow-up    12 month f/u no complaints today. Meds reviewed verbally with pt.      History of Present Illness: Thomas Romero is a 57 y.o. male who presents for a follow-up visit regarding coronary artery disease.  He has prolonged history of tobacco use, previous heavy alcohol use, previous gastric ulcer, anxiety and depression and hyperlipidemia. He had inferior ST elevation myocardial infarction in November 2019. Cardiac catheterization showed 99% stenosis in the mid RCA which was the culprit which was treated successfully with PCI and drug-eluting stent placement.  There was moderate mid left circumflex disease that was left to be treated medically.  He was hospitalized last month at [facility] with chest pain.  He was found to have abnormal liver enzymes.  Abdominal ultrasound showed cholelithiasis with mild dilatation of the extrahepatic bile duct.  ERCP was performed followed by cholecystectomy.  His troponin was normal.  He has been doing better since then with resolution of symptoms.  He denies chest pain.  He has chronic exertional dyspnea related to COPD.  Unfortunately, he continues to smoke 1 pack/day.  He started smoking at 57 years old.   Past Medical History:  Diagnosis Date   Coronary artery disease    Non-ST elevation myocardial infarction in November 2019.  Cardiac catheterization showed 99% stenosis in the mid RCA treated successfully with PCI and drug-eluting stent placement in moderate mid left circumflex disease.   Gastric ulcer    Hyperlipidemia    Manic depression (HCC)    Panic attacks    Tobacco use     Past Surgical History:  Procedure Laterality Date   CORONARY/GRAFT ACUTE MI REVASCULARIZATION N/A 07/25/2018   Procedure:  Coronary/Graft Acute MI Revascularization;  Surgeon: Iran Ouch, MD;  Location: [facility] INVASIVE CV LAB;  Service: Cardiovascular;  Laterality: N/A;   ERCP N/A 12/08/2022   Procedure: ENDOSCOPIC RETROGRADE CHOLANGIOPANCREATOGRAPHY (ERCP);  Surgeon: Midge Minium, MD;  Location: [facility] ENDOSCOPY;  Service: Endoscopy;  Laterality: N/A;   LEFT HEART CATH AND CORONARY ANGIOGRAPHY N/A 07/25/2018   Procedure: LEFT HEART CATH AND CORONARY ANGIOGRAPHY;  Surgeon: Iran Ouch, MD;  Location: [facility] INVASIVE CV LAB;  Service: Cardiovascular;  Laterality: N/A;   LYSIS OF ADHESION N/A 12/09/2022   Procedure: LYSIS OF ADHESION;  Surgeon: Leafy Ro, MD;  Location: [facility] ORS;  Service: General;  Laterality: N/A;   SMALL INTESTINE SURGERY       Current Outpatient Medications  Medication Sig Dispense Refill   acetaminophen (TYLENOL) 325 MG tablet Take 2 tablets (650 mg total) by mouth every 8 (eight) hours as needed for moderate pain, fever, headache or mild pain.     albuterol (VENTOLIN HFA) 108 (90 Base) MCG/ACT inhaler Inhale 1-2 puffs into the lungs as needed.     aspirin 81 MG chewable tablet Chew 1 tablet (81 mg total) by mouth daily. 90 tablet 0   atorvastatin (LIPITOR) 80 MG tablet Take 1 tablet by mouth once daily 30 tablet 6   ezetimibe (ZETIA) 10 MG tablet Take 1 tablet by mouth once daily 30 tablet 0   metoprolol succinate (TOPROL-XL) 50 MG 24 hr tablet Take 1 tablet (50 mg total) by mouth daily. Take with or immediately following a  meal. 90 tablet 3   nitroGLYCERIN (NITROSTAT) 0.4 MG SL tablet Place 1 tablet (0.4 mg total) under the tongue every 5 (five) minutes as needed for chest pain. 25 tablet 1   pantoprazole (PROTONIX) 40 MG tablet Take 1 tablet (40 mg total) by mouth daily. 30 tablet 1   SPIRIVA HANDIHALER 18 MCG inhalation capsule Place 18 mcg into inhaler and inhale daily.     traZODone (DESYREL) 50 MG tablet Take 50 mg by mouth at bedtime as needed for sleep.     No current  facility-administered medications for this visit.    Allergies:   Ibuprofen    Social History:  The patient  reports that he has been smoking cigarettes. He has a 72.00 pack-year smoking history. He has never used smokeless tobacco. He reports current alcohol use. He reports that he does not use drugs.   Family History:  The patient's family history includes Heart attack in his brother, mother, and sister; Stroke in his father.    ROS:  Please see the history of present illness.   Otherwise, review of systems are positive for none.   All other systems are reviewed and negative.    PHYSICAL EXAM: VS:  BP 124/70 (BP Location: Left Arm, Patient Position: Sitting, Cuff Size: Normal)   Pulse (!) 56   Ht 5\' 10"  (1.778 m)   Wt 155 lb 8 oz (70.5 kg)   SpO2 98%   BMI 22.31 kg/m  , BMI Body mass index is 22.31 kg/m. GEN: Well nourished, well developed, in no acute distress  HEENT: normal  Neck: no JVD, carotid bruits, or masses Cardiac: RRR; no murmurs, rubs, or gallops,no edema  Respiratory:  clear to auscultation bilaterally, normal work of breathing GI: soft, nontender, nondistended, + BS MS: no deformity or atrophy  Skin: warm and dry, no rash Neuro:  Strength and sensation are intact Psych: euthymic mood, full affect    EKG:  EKG is ordered today. The ekg ordered today demonstrates sinus bradycardia with possible old inferior infarct.   Recent Labs: 12/10/2022: ALT 129; BUN 11; Creatinine, Ser 0.82; Hemoglobin 13.1; Magnesium 2.2; Platelets 130; Potassium 4.0; Sodium 137    Lipid Panel    Component Value Date/Time   CHOL 101 12/08/2022 0416   CHOL 117 10/17/2021 0848   TRIG 107 12/08/2022 0416   HDL 19 (L) 12/08/2022 0416   HDL 18 (L) 10/17/2021 0848   CHOLHDL 5.3 12/08/2022 0416   VLDL 21 12/08/2022 0416   LDLCALC 61 12/08/2022 0416   LDLCALC 63 10/17/2021 0848   LDLDIRECT 105 (H) 09/28/2018 1502      Wt Readings from Last 3 Encounters:  01/08/23 155 lb 8 oz  (70.5 kg)  12/31/22 154 lb (69.9 kg)  12/08/22 161 lb (73 kg)          No data to display            ASSESSMENT AND PLAN:  1.  Coronary artery disease involving native coronary arteries without angina: He is doing well overall with no anginal symptoms.  Continue medical therapy.   Continue aspirin indefinitely.  Continue Toprol.  2.  Hyperlipidemia: Atorvastatin was refilled today.  Continue ezetimibe as well.  I reviewed his lipid profile from last month which showed an LDL of 61.  His LFTs were abnormal but that was due to his gallbladder disease.  3.  Tobacco use: I had a prolonged discussion with him about the importance of smoking cessation.  Unfortunately, he  reports inability to quit citing that he started smoking when he was 57 years old .  4.  COPD: Stable on inhalers.    Disposition:   FU with me in 12 months  Signed,  Lorine Bears, MD  01/08/2023 7:47 AM    [facility]

## 2023-12-29 ENCOUNTER — Other Ambulatory Visit: Payer: Self-pay | Admitting: Cardiovascular Disease

## 2024-01-26 ENCOUNTER — Other Ambulatory Visit: Payer: Self-pay | Admitting: Cardiovascular Disease

## 2024-02-02 ENCOUNTER — Encounter: Payer: Self-pay | Admitting: Emergency Medicine

## 2024-02-24 ENCOUNTER — Other Ambulatory Visit: Payer: Self-pay | Admitting: Cardiovascular Disease

## 2024-03-23 ENCOUNTER — Other Ambulatory Visit: Payer: Self-pay | Admitting: Cardiovascular Disease

## 2024-05-01 ENCOUNTER — Other Ambulatory Visit: Payer: Self-pay | Admitting: Cardiovascular Disease

## 2024-06-01 ENCOUNTER — Encounter: Payer: Self-pay | Admitting: *Deleted

## 2024-06-13 ENCOUNTER — Encounter: Payer: Self-pay | Admitting: Cardiovascular Disease

## 2024-06-13 ENCOUNTER — Ambulatory Visit: Attending: Cardiovascular Disease | Admitting: Cardiovascular Disease

## 2024-06-13 VITALS — BP 148/70 | HR 72

## 2024-06-13 DIAGNOSIS — E785 Hyperlipidemia, unspecified: Secondary | ICD-10-CM

## 2024-06-13 DIAGNOSIS — Z720 Tobacco use: Secondary | ICD-10-CM

## 2024-06-13 DIAGNOSIS — I2510 Atherosclerotic heart disease of native coronary artery without angina pectoris: Secondary | ICD-10-CM

## 2024-06-13 MED ORDER — METOPROLOL SUCCINATE ER 50 MG PO TB24: 50.0000 mg | ORAL_TABLET | Freq: Every day | ORAL | 3 refills | Status: AC

## 2024-06-13 MED ORDER — NITROGLYCERIN 0.4 MG SL SUBL: 0.4000 mg | SUBLINGUAL_TABLET | SUBLINGUAL | 0 refills | Status: AC | PRN

## 2024-06-13 MED ORDER — ATORVASTATIN CALCIUM 80 MG PO TABS: 80.0000 mg | ORAL_TABLET | Freq: Every day | ORAL | 3 refills | Status: AC

## 2024-06-13 MED ORDER — EZETIMIBE 10 MG PO TABS: 10.0000 mg | ORAL_TABLET | Freq: Every day | ORAL | 3 refills | Status: AC

## 2024-06-13 NOTE — Progress Notes (Signed)
 Cardiology Office Note   Date:  06/13/2024   ID:  Thomas Romero, DOB 08/09/1966, MRN [id number]  PCP:  [facility]  Cardiologist:   Deatrice Cage, MD   Chief Complaint  Patient presents with   Follow-up    OD 12 month f/u no complaints today. Meds reviewed verbally with pt.      History of Present Illness: Thomas Romero is a 58 y.o. male who presents for a follow-up visit regarding coronary artery disease.  He has prolonged history of tobacco use, previous heavy alcohol use, previous gastric ulcer, anxiety and depression and hyperlipidemia. He had inferior ST elevation myocardial infarction in November 2019. Cardiac catheterization showed 99% stenosis in the mid RCA which was the culprit which was treated successfully with PCI and drug-eluting stent placement.  There was moderate mid left circumflex disease that was left to be treated medically.  He was hospitalized in 2024 for chest pain.  He was found to have abnormal liver enzymes.  Abdominal ultrasound showed cholelithiasis with mild dilatation of the extrahepatic bile duct.  ERCP was performed followed by cholecystectomy.  His troponin was normal.  He has been doing well from a cardiac standpoint with no chest pain or worsening dyspnea.  Unfortunately, he continues to smoke at least 1 pack/day.  He has been smoking since 58 years old.   Past Medical History:  Diagnosis Date   Coronary artery disease    Non-ST elevation myocardial infarction in November 2019.  Cardiac catheterization showed 99% stenosis in the mid RCA treated successfully with PCI and drug-eluting stent placement in moderate mid left circumflex disease.   Gastric ulcer    Hyperlipidemia    Manic depression (HCC)    Panic attacks    Tobacco use     Past Surgical History:  Procedure Laterality Date   CHOLECYSTECTOMY     12/2023   CORONARY/GRAFT ACUTE MI REVASCULARIZATION N/A 07/25/2018   Procedure: Coronary/Graft Acute MI  Revascularization;  Surgeon: Cage Deatrice LABOR, MD;  Location: [facility] INVASIVE CV LAB;  Service: Cardiovascular;  Laterality: N/A;   ERCP N/A 12/08/2022   Procedure: ENDOSCOPIC RETROGRADE CHOLANGIOPANCREATOGRAPHY (ERCP);  Surgeon: Jinny Carmine, MD;  Location: [facility] ENDOSCOPY;  Service: Endoscopy;  Laterality: N/A;   LEFT HEART CATH AND CORONARY ANGIOGRAPHY N/A 07/25/2018   Procedure: LEFT HEART CATH AND CORONARY ANGIOGRAPHY;  Surgeon: Cage Deatrice LABOR, MD;  Location: [facility] INVASIVE CV LAB;  Service: Cardiovascular;  Laterality: N/A;   LYSIS OF ADHESION N/A 12/09/2022   Procedure: LYSIS OF ADHESION;  Surgeon: Jordis Laneta FALCON, MD;  Location: [facility] ORS;  Service: General;  Laterality: N/A;   SMALL INTESTINE SURGERY       Current Outpatient Medications  Medication Sig Dispense Refill   acetaminophen  (TYLENOL ) 325 MG tablet Take 2 tablets (650 mg total) by mouth every 8 (eight) hours as needed for moderate pain, fever, headache or mild pain.     albuterol (VENTOLIN HFA) 108 (90 Base) MCG/ACT inhaler Inhale 1-2 puffs into the lungs as needed.     aspirin  81 MG chewable tablet Chew 1 tablet (81 mg total) by mouth daily. 90 tablet 0   atorvastatin  (LIPITOR) 80 MG tablet Take 1 tablet (80 mg total) by mouth daily. 90 tablet 3   ezetimibe  (ZETIA ) 10 MG tablet Take 1 tablet (10 mg total) by mouth daily. 90 tablet 3   metoprolol  succinate (TOPROL -XL) 50 MG 24 hr tablet Take 1 tablet (50 mg total) by mouth daily. Take  with or immediately following a meal. 90 tablet 3   nitroGLYCERIN  (NITROSTAT ) 0.4 MG SL tablet Place 1 tablet (0.4 mg total) under the tongue every 5 (five) minutes as needed for chest pain. 25 tablet 0   pantoprazole  (PROTONIX ) 40 MG tablet Take 1 tablet (40 mg total) by mouth daily. (Patient not taking: Reported on 06/13/2024) 30 tablet 1   SPIRIVA  HANDIHALER 18 MCG inhalation capsule Place 18 mcg into inhaler and inhale daily.     traZODone  (DESYREL ) 50 MG tablet Take 50 mg by mouth at bedtime as  needed for sleep.     No current facility-administered medications for this visit.    Allergies:   Ibuprofen    Social History:  The patient  reports that he has been smoking cigarettes. He has a 72 pack-year smoking history. He has never used smokeless tobacco. He reports current alcohol use. He reports that he does not use drugs.   Family History:  The patient's family history includes Heart attack in his brother, mother, and sister; Stroke in his father.    ROS:  Please see the history of present illness.   Otherwise, review of systems are positive for none.   All other systems are reviewed and negative.    PHYSICAL EXAM: VS:  BP (!) 148/70 (BP Location: Left Arm, Patient Position: Sitting, Cuff Size: Normal)   Pulse 72   SpO2 97%  , BMI There is no height or weight on file to calculate BMI. GEN: Well nourished, well developed, in no acute distress  HEENT: normal  Neck: no JVD, carotid bruits, or masses Cardiac: RRR; no murmurs, rubs, or gallops,no edema  Respiratory:  clear to auscultation bilaterally, normal work of breathing GI: soft, nontender, nondistended, + BS MS: no deformity or atrophy  Skin: warm and dry, no rash Neuro:  Strength and sensation are intact Psych: euthymic mood, full affect    EKG:  EKG is ordered today. The ekg ordered today demonstrates : Normal sinus rhythm Normal ECG       Recent Labs: No results found for requested labs within last 365 days.    Lipid Panel    Component Value Date/Time   CHOL 101 12/08/2022 0416   CHOL 117 10/17/2021 0848   TRIG 107 12/08/2022 0416   HDL 19 (L) 12/08/2022 0416   HDL 18 (L) 10/17/2021 0848   CHOLHDL 5.3 12/08/2022 0416   VLDL 21 12/08/2022 0416   LDLCALC 61 12/08/2022 0416   LDLCALC 63 10/17/2021 0848   LDLDIRECT 105 (H) 09/28/2018 1502      Wt Readings from Last 3 Encounters:  01/08/23 155 lb 8 oz (70.5 kg)  12/31/22 154 lb (69.9 kg)  12/08/22 161 lb (73 kg)          No data to  display            ASSESSMENT AND PLAN:  1.  Coronary artery disease involving native coronary arteries without angina: He is doing well overall with no anginal symptoms.  Continue medical therapy.   Continue aspirin  indefinitely.  I refilled his Toprol .  2.  Hyperlipidemia: Atorvastatin  and ezetimibe  were refilled today.  Most recent lipid profile with his primary care physician in May showed an LDL of 71.  3.  Tobacco use: I had a prolonged discussion with him about the importance of smoking cessation.  Unfortunately, he reports inability to quit citing that he started smoking when he was 58 years old .  4.  COPD: Stable on  inhalers.    Disposition:   FU with me in 12 months  Signed,  Deatrice Cage, MD  06/13/2024 2:45 PM    [facility]

## 2024-06-13 NOTE — Patient Instructions (Addendum)
 Medication Instructions:  No changes *If you need a refill on your cardiac medications before your next appointment, please call your pharmacy*  Lab Work: None ordered If you have labs (blood work) drawn today and your tests are completely normal, you will receive your results only by: MyChart Message (if you have MyChart) OR A paper copy in the mail If you have any lab test that is abnormal or we need to change your treatment, we will call you to review the results.  Testing/Procedures: None ordered  Follow-Up: At [facility], you and your health needs are our priority.  As part of our continuing mission to provide you with exceptional heart care, our providers are all part of one team.  This team includes your primary Cardiologist (physician) and Advanced Practice Providers or APPs (Physician Assistants and Nurse Practitioners) who all work together to provide you with the care you need, when you need it.  Your next appointment:   12 month(s)  Provider:   You may see Deatrice Cage, MD or one of the following Advanced Practice Providers on your designated Care Team:   Lonni Meager, NP Lesley Maffucci, PA-C Bernardino Bring, PA-C Cadence Gypsy, PA-C Tylene Lunch, NP Barnie Hila, NP    We recommend signing up for the patient portal called MyChart.  Sign up information is provided on this After Visit Summary.  MyChart is used to connect with patients for Virtual Visits (Telemedicine).  Patients are able to view lab/test results, encounter notes, upcoming appointments, etc.  Non-urgent messages can be sent to your provider as well.   To learn more about what you can do with MyChart, go to [email or web address].      Managing the Challenge of Quitting Smoking Quitting smoking is a physical and mental challenge. You may have cravings, withdrawal symptoms, and temptation to smoke. Before quitting, work with your health care provider to make a plan that can help you  manage quitting. Making a plan before you quit may keep you from smoking when you have the urge to smoke while trying to quit. How to manage lifestyle changes Managing stress Stress can make you want to smoke, and wanting to smoke may cause stress. It is important to find ways to manage your stress. You could try some of the following: Practice relaxation techniques. Breathe slowly and deeply, in through your nose and out through your mouth. Listen to music. Soak in a bath or take a shower. Imagine a peaceful place or vacation. Get some support. Talk with family or friends about your stress. Join a support group. Talk with a counselor or therapist. Get some physical activity. Go for a walk, run, or bike ride. Play a favorite sport. Practice yoga.  Medicines Talk with your health care provider about medicines that might help you deal with cravings and make quitting easier for you. Relationships Social situations can be difficult when you are quitting smoking. To manage this, you can: Avoid parties and other social situations where people might be smoking. Avoid alcohol. Leave right away if you have the urge to smoke. Explain to your family and friends that you are quitting smoking. Ask for support and let them know you might be a bit grumpy. Plan activities where smoking is not an option. General instructions Be aware that many people gain weight after they quit smoking. However, not everyone does. To keep from gaining weight, have a plan in place before you quit, and stick to the plan after you quit. Your plan  should include: Eating healthy snacks. When you have a craving, it may help to: Eat popcorn, or try carrots, celery, or other cut vegetables. Chew sugar-free gum. Changing how you eat. Eat small portion sizes at meals. Eat 4-6 small meals throughout the day instead of 1-2 large meals a day. Be mindful when you eat. You should avoid watching television or doing other things  that might distract you as you eat. Exercising regularly. Make time to exercise each day. If you do not have time for a long workout, do short bouts of exercise for 5-10 minutes several times a day. Do some form of strengthening exercise, such as weight lifting. Do some exercise that gets your heart beating and causes you to breathe deeply, such as walking fast, running, swimming, or biking. This is very important. Drinking plenty of water or other low-calorie or no-calorie drinks. Drink enough fluid to keep your urine pale yellow.  How to recognize withdrawal symptoms Your body and mind may experience discomfort as you try to get used to not having nicotine  in your system. These effects are called withdrawal symptoms. They may include: Feeling hungrier than normal. Having trouble concentrating. Feeling irritable or restless. Having trouble sleeping. Feeling depressed. Craving a cigarette. These symptoms may surprise you, but they are normal to have when quitting smoking. To manage withdrawal symptoms: Avoid places, people, and activities that trigger your cravings. Remember why you want to quit. Get plenty of sleep. Avoid coffee and other drinks that contain caffeine. These may worsen some of your symptoms. How to manage cravings Come up with a plan for how to deal with your cravings. The plan should include the following: A definition of the specific situation you want to deal with. An activity or action you will take to replace smoking. A clear idea for how this action will help. The name of someone who could help you with this. Cravings usually last for 5-10 minutes. Consider taking the following actions to help you with your plan to deal with cravings: Keep your mouth busy. Chew sugar-free gum. Suck on hard candies or a straw. Brush your teeth. Keep your hands and body busy. Change to a different activity right away. Squeeze or play with a ball. Do an activity or a hobby, such  as making bead jewelry, practicing needlepoint, or working with wood. Mix up your normal routine. Take a short exercise break. Go for a quick walk, or run up and down stairs. Focus on doing something kind or helpful for someone else. Call a friend or family member to talk during a craving. Join a support group. Contact a quitline. Where to find support To get help or find a support group: Call the National Cancer Institute's Smoking Quitline: 1-800-QUIT-NOW (351) 528-7230) Text QUIT to SmokefreeTXT: 521151 Where to find more information Visit these websites to find more information on quitting smoking: U.S. Department of Health and Human Services: www.smokefree.gov American Lung Association: www.freedomfromsmoking.org Centers for Disease Control and Prevention (CDC): FootballExhibition.com.br American Heart Association: www.heart.org Contact a health care provider if: You want to change your plan for quitting. The medicines you are taking are not helping. Your eating feels out of control or you cannot sleep. You feel depressed or become very anxious. Summary Quitting smoking is a physical and mental challenge. You will face cravings, withdrawal symptoms, and temptation to smoke again. Preparation can help you as you go through these challenges. Try different techniques to manage stress, handle social situations, and prevent weight gain. You can deal  with cravings by keeping your mouth busy (such as by chewing gum), keeping your hands and body busy, calling family or friends, or contacting a quitline for people who want to quit smoking. You can deal with withdrawal symptoms by avoiding places where people smoke, getting plenty of rest, and avoiding drinks that contain caffeine. This information is not intended to replace advice given to you by your health care provider. Make sure you discuss any questions you have with your health care provider. Document Revised: 08/15/2021 Document Reviewed:  08/15/2021 Elsevier Patient Education  2024 [employer].
